# Patient Record
Sex: Female | Born: 1935
Health system: Southern US, Community
[De-identification: ages and names within clinical notes are randomized; demographics above are authoritative.]

## PROBLEM LIST (undated history)

## (undated) DIAGNOSIS — E119 Type 2 diabetes mellitus without complications: Secondary | ICD-10-CM

## (undated) DIAGNOSIS — I10 Essential (primary) hypertension: Secondary | ICD-10-CM

## (undated) DIAGNOSIS — C3490 Malignant neoplasm of unspecified part of unspecified bronchus or lung: Secondary | ICD-10-CM

## (undated) DIAGNOSIS — C679 Malignant neoplasm of bladder, unspecified: Secondary | ICD-10-CM

## (undated) DIAGNOSIS — K219 Gastro-esophageal reflux disease without esophagitis: Secondary | ICD-10-CM

## (undated) DIAGNOSIS — E079 Disorder of thyroid, unspecified: Secondary | ICD-10-CM

## (undated) HISTORY — DX: Essential (primary) hypertension: I10

## (undated) HISTORY — DX: Malignant neoplasm of bladder, unspecified: C67.9

## (undated) HISTORY — DX: Gastro-esophageal reflux disease without esophagitis: K21.9

## (undated) HISTORY — DX: Disorder of thyroid, unspecified: E07.9

## (undated) HISTORY — PX: CYSTOSCOPY: SUR368

## (undated) HISTORY — DX: Malignant neoplasm of unspecified part of unspecified bronchus or lung: C34.90

## (undated) HISTORY — PX: LUNG CANCER SURGERY: SHX702

---

## 2003-10-20 ENCOUNTER — Encounter: Admission: RE | Admit: 2003-10-20 | Discharge: 2003-10-20 | Payer: Self-pay | Admitting: Orthopaedic Surgery

## 2004-09-13 ENCOUNTER — Ambulatory Visit: Payer: Self-pay

## 2006-11-27 ENCOUNTER — Encounter: Admission: RE | Admit: 2006-11-27 | Discharge: 2006-11-27 | Payer: Self-pay | Admitting: Orthopaedic Surgery

## 2007-04-01 ENCOUNTER — Other Ambulatory Visit: Payer: Self-pay

## 2007-04-01 ENCOUNTER — Emergency Department: Payer: Self-pay | Admitting: Unknown Physician Specialty

## 2008-03-28 ENCOUNTER — Ambulatory Visit: Payer: Self-pay | Admitting: Gastroenterology

## 2008-12-12 ENCOUNTER — Ambulatory Visit: Payer: Self-pay | Admitting: Internal Medicine

## 2008-12-12 DIAGNOSIS — I152 Hypertension secondary to endocrine disorders: Secondary | ICD-10-CM | POA: Insufficient documentation

## 2008-12-12 DIAGNOSIS — E785 Hyperlipidemia, unspecified: Secondary | ICD-10-CM

## 2008-12-12 DIAGNOSIS — Z85118 Personal history of other malignant neoplasm of bronchus and lung: Secondary | ICD-10-CM

## 2008-12-12 DIAGNOSIS — I10 Essential (primary) hypertension: Secondary | ICD-10-CM | POA: Insufficient documentation

## 2008-12-12 DIAGNOSIS — R0989 Other specified symptoms and signs involving the circulatory and respiratory systems: Secondary | ICD-10-CM | POA: Insufficient documentation

## 2008-12-12 DIAGNOSIS — R0609 Other forms of dyspnea: Secondary | ICD-10-CM

## 2009-01-11 ENCOUNTER — Ambulatory Visit: Payer: Self-pay | Admitting: Internal Medicine

## 2009-01-11 DIAGNOSIS — K219 Gastro-esophageal reflux disease without esophagitis: Secondary | ICD-10-CM | POA: Insufficient documentation

## 2009-02-22 ENCOUNTER — Ambulatory Visit: Payer: Self-pay | Admitting: Internal Medicine

## 2009-02-22 DIAGNOSIS — R498 Other voice and resonance disorders: Secondary | ICD-10-CM | POA: Insufficient documentation

## 2010-05-14 ENCOUNTER — Emergency Department: Payer: Self-pay | Admitting: Emergency Medicine

## 2010-06-15 ENCOUNTER — Ambulatory Visit: Payer: Self-pay | Admitting: Internal Medicine

## 2010-06-20 ENCOUNTER — Ambulatory Visit: Payer: Self-pay | Admitting: Urology

## 2010-06-24 ENCOUNTER — Ambulatory Visit: Payer: Self-pay | Admitting: Urology

## 2010-07-05 ENCOUNTER — Ambulatory Visit: Payer: Self-pay | Admitting: Internal Medicine

## 2010-07-16 ENCOUNTER — Ambulatory Visit: Payer: Self-pay | Admitting: Internal Medicine

## 2010-07-16 ENCOUNTER — Ambulatory Visit: Payer: Self-pay | Admitting: Oncology

## 2010-08-15 ENCOUNTER — Ambulatory Visit: Payer: Self-pay | Admitting: Internal Medicine

## 2012-02-12 ENCOUNTER — Other Ambulatory Visit: Payer: Self-pay

## 2012-02-12 LAB — BASIC METABOLIC PANEL
Co2: 27 mmol/L (ref 21–32)
Osmolality: 282 (ref 275–301)
Potassium: 4 mmol/L (ref 3.5–5.1)

## 2012-09-15 HISTORY — PX: SKIN CANCER EXCISION: SHX779

## 2012-09-22 ENCOUNTER — Other Ambulatory Visit: Payer: Self-pay

## 2012-09-22 LAB — CBC WITH DIFFERENTIAL/PLATELET
Eosinophil #: 0.2 10*3/uL (ref 0.0–0.7)
HGB: 13 g/dL (ref 12.0–16.0)
Lymphocyte #: 1.7 10*3/uL (ref 1.0–3.6)
MCH: 30.1 pg (ref 26.0–34.0)
MCHC: 33.1 g/dL (ref 32.0–36.0)
Neutrophil #: 4.3 10*3/uL (ref 1.4–6.5)
RBC: 4.33 10*6/uL (ref 3.80–5.20)
RDW: 13 % (ref 11.5–14.5)

## 2012-09-22 LAB — BASIC METABOLIC PANEL
Anion Gap: 6 — ABNORMAL LOW (ref 7–16)
Co2: 25 mmol/L (ref 21–32)
Glucose: 99 mg/dL (ref 65–99)
Osmolality: 280 (ref 275–301)

## 2013-03-22 ENCOUNTER — Other Ambulatory Visit: Payer: Self-pay

## 2013-03-22 LAB — BASIC METABOLIC PANEL
Calcium, Total: 9.2 mg/dL (ref 8.5–10.1)
Chloride: 106 mmol/L (ref 98–107)
Creatinine: 0.91 mg/dL (ref 0.60–1.30)
EGFR (African American): >60
EGFR (Non-African Amer.): 59 — ABNORMAL LOW

## 2014-01-24 ENCOUNTER — Ambulatory Visit (INDEPENDENT_AMBULATORY_CARE_PROVIDER_SITE_OTHER)
Admission: RE | Admit: 2014-01-24 | Discharge: 2014-01-24 | Disposition: A | Discharge status: Home / Self Care | Payer: Medicare Other | Source: Ambulatory Visit | Attending: Internal Medicine | Admitting: Internal Medicine

## 2014-01-24 ENCOUNTER — Encounter: Payer: Self-pay | Admitting: Internal Medicine

## 2014-01-24 ENCOUNTER — Ambulatory Visit (INDEPENDENT_AMBULATORY_CARE_PROVIDER_SITE_OTHER): Payer: Medicare Other | Admitting: Internal Medicine

## 2014-01-24 VITALS — BP 130/70 | HR 74 | Temp 97.9°F | Ht 65.25 in | Wt 197.0 lb

## 2014-01-24 DIAGNOSIS — M545 Low back pain, unspecified: Secondary | ICD-10-CM

## 2014-01-24 DIAGNOSIS — K219 Gastro-esophageal reflux disease without esophagitis: Secondary | ICD-10-CM

## 2014-01-24 DIAGNOSIS — M25552 Pain in left hip: Secondary | ICD-10-CM

## 2014-01-24 DIAGNOSIS — M25559 Pain in unspecified hip: Secondary | ICD-10-CM

## 2014-01-24 DIAGNOSIS — E785 Hyperlipidemia, unspecified: Secondary | ICD-10-CM

## 2014-01-24 DIAGNOSIS — R5383 Other fatigue: Secondary | ICD-10-CM

## 2014-01-24 DIAGNOSIS — I10 Essential (primary) hypertension: Secondary | ICD-10-CM

## 2014-01-24 DIAGNOSIS — R5381 Other malaise: Secondary | ICD-10-CM

## 2014-01-24 LAB — COMPREHENSIVE METABOLIC PANEL
ALBUMIN: 3.7 g/dL (ref 3.5–5.2)
ALT: 17 U/L (ref 0–35)
AST: 25 U/L (ref 0–37)
Alkaline Phosphatase: 85 U/L (ref 39–117)
BILIRUBIN TOTAL: 0.7 mg/dL (ref 0.2–1.2)
BUN: 15 mg/dL (ref 6–23)
CALCIUM: 9.2 mg/dL (ref 8.4–10.5)
CHLORIDE: 106 meq/L (ref 96–112)
CO2: 27 meq/L (ref 19–32)
Creatinine, Ser: 0.9 mg/dL (ref 0.4–1.2)
GFR: 62.71 mL/min (ref >60.00)
GLUCOSE: 92 mg/dL (ref 70–99)
Potassium: 4.3 mEq/L (ref 3.5–5.1)
SODIUM: 139 meq/L (ref 135–145)
TOTAL PROTEIN: 7.6 g/dL (ref 6.0–8.3)

## 2014-01-24 LAB — VITAMIN B12: Vitamin B-12: 243 pg/mL (ref 211–911)

## 2014-01-24 LAB — CBC
HEMATOCRIT: 38.1 % (ref 36.0–46.0)
HEMOGLOBIN: 12.5 g/dL (ref 12.0–15.0)
MCHC: 32.9 g/dL (ref 30.0–36.0)
MCV: 89.3 fl (ref 78.0–100.0)
Platelets: 285 10*3/uL (ref 150.0–400.0)
RBC: 4.27 Mil/uL (ref 3.87–5.11)
RDW: 14.6 % (ref 11.5–15.5)
WBC: 9.8 10*3/uL (ref 4.0–10.5)

## 2014-01-24 LAB — TSH: TSH: 0.41 u[IU]/mL (ref 0.35–4.50)

## 2014-01-24 NOTE — Progress Notes (Signed)
Pre visit review using our clinic review tool, if applicable. No additional management support is needed unless otherwise documented below in the visit note. 

## 2014-01-24 NOTE — Patient Instructions (Addendum)

## 2014-01-24 NOTE — Progress Notes (Signed)
HPI  Pt presents to the clinic today to establish care. She is transferring care from Dr. Hardin Negus in [place]. She does have some concerns today about pain in her left hip. She reports this occurred last Friday night. She was trying to get out of the bed in the middle of the night to use the bathroom. She tried to get up and had a sharp pain in her left hip that radiated down her leg. She felt like she couldn't move her leg secondary to the severe pain.  This episode lasted a few hours and then seemed to resolve on its own. She has not had any pain since that time, but overall just feels achy and sore. She did not take anything OTC for the pain. She has never had pain like this in the past. She denies loss of bowel or bladder.  Additionally, she c/o fatigue. She feels like some of her vitamins may be low. She would like blood work today to see if anything is off.   Flu: 9/14 Tetanus: unsure Pneumovax: unsure Zostovax: unsure Mammogram: 2014 Pap Smear: no longer screening Bone Density: unsure Colon Screening: unsure Eye Doctor: yearly Dentist: biannually  Past Medical History  Diagnosis Date  . GERD (gastroesophageal reflux disease)   . Thyroid disease   . Hypertension     Current Outpatient Prescriptions  Medication Sig Dispense Refill  . Cholecalciferol (VITAMIN D3) 1000 UNITS CAPS Take 1 capsule by mouth daily.      . Glucosamine-Chondroit-Vit C-Mn (GLUCOSAMINE CHONDR 500 COMPLEX PO) Take 2 capsules by mouth daily.      Marland Kitchen levothyroxine (SYNTHROID, LEVOTHROID) 88 MCG tablet Take 88 mcg by mouth daily before breakfast.      . losartan (COZAAR) 50 MG tablet Take 50 mg by mouth daily.      Marland Kitchen omeprazole (PRILOSEC) 20 MG capsule Take 20 mg by mouth daily.       No current facility-administered medications for this visit.    No Known Allergies  Family History  Problem Relation Age of Onset  . Stroke Mother   . Heart disease Father   . Cancer Sister     Pancreatic     History   Social History  . Marital Status: Widowed    Spouse Name: N/A    Number of Children: N/A  . Years of Education: N/A   Occupational History  . Not on file.   Social History Main Topics  . Smoking status: Never Smoker   . Smokeless tobacco: Not on file  . Alcohol Use: No  . Drug Use: No  . Sexual Activity: Not Currently   Other Topics Concern  . Not on file   Social History Narrative  . No narrative on file    ROS:  Constitutional: Pt reports fatigue. Denies fever, malaise, headache or abrupt weight changes.  Respiratory: Denies difficulty breathing, shortness of breath, cough or sputum production.   Cardiovascular: Denies chest pain, chest tightness, palpitations or swelling in the hands or feet.  Musculoskeletal: Pt reports low back pain/left hip pain. Denies decrease in range of motion, difficulty with gait,or joint pain and swelling.    No other specific complaints in a complete review of systems (except as listed in HPI above).  PE:  BP 130/70  Pulse 74  Temp(Src) 97.9 F (36.6 C) (Oral)  Ht 5' 5.25" (1.657 m)  Wt 197 lb (89.359 kg)  BMI 32.55 kg/m2 Wt Readings from Last 3 Encounters:  01/24/14 197 lb (89.359 kg)  02/22/09 196 lb (88.905 kg)  01/11/09 199 lb (90.266 kg)    General: Appears her stated age, well developed, well nourished in NAD. Cardiovascular: Normal rate and rhythm. S1,S2 noted.  No murmur, rubs or gallops noted. No JVD or BLE edema. No carotid bruits noted. Pulmonary/Chest: Normal effort and positive vesicular breath sounds. No respiratory distress. No wheezes, rales or ronchi noted.  Musculoskeletal: Normal flexion and extension of the back and left hip. Strength 5/5 BLE. Unable to reproduce pain with palpation   Assessment and Plan:  Fatigue:  Will check CBC, TSH, Vit B12 and Vit D  Low back pain/left hip pain:  Has improved since that episode Will check xray of lumbar spine and xray of left hip  RTC as needed

## 2014-01-24 NOTE — Assessment & Plan Note (Signed)
Well controlled on prilosec Will continue for now

## 2014-01-24 NOTE — Assessment & Plan Note (Signed)
Well controlled on current therapy Will continue for now 

## 2014-01-25 ENCOUNTER — Telehealth: Payer: Self-pay | Admitting: Internal Medicine

## 2014-01-25 LAB — VITAMIN D 25 HYDROXY (VIT D DEFICIENCY, FRACTURES): Vit D, 25-Hydroxy: 31 ng/mL (ref 30–89)

## 2014-01-25 NOTE — Telephone Encounter (Signed)
Relevant patient education mailed to patient.  

## 2014-06-28 ENCOUNTER — Telehealth: Payer: Self-pay

## 2014-06-28 NOTE — Telephone Encounter (Signed)
Pt request to pick up copy of recent xrays; pt having problems with back, knee and arm. Pt request to pick up ASAP. Spoke with Aniceto Boss and will send note to [facility].

## 2014-06-28 NOTE — Telephone Encounter (Signed)
Disc ready for pickup, pt notified

## 2015-09-11 ENCOUNTER — Encounter: Payer: Self-pay | Admitting: Internal Medicine

## 2015-10-02 ENCOUNTER — Encounter: Payer: Self-pay | Admitting: Internal Medicine

## 2015-10-02 ENCOUNTER — Ambulatory Visit (INDEPENDENT_AMBULATORY_CARE_PROVIDER_SITE_OTHER): Payer: Medicare Other | Admitting: Internal Medicine

## 2015-10-02 VITALS — BP 132/78 | HR 76 | Temp 97.9°F | Ht 64.5 in | Wt 192.5 lb

## 2015-10-02 DIAGNOSIS — E039 Hypothyroidism, unspecified: Secondary | ICD-10-CM | POA: Insufficient documentation

## 2015-10-02 DIAGNOSIS — I10 Essential (primary) hypertension: Secondary | ICD-10-CM

## 2015-10-02 DIAGNOSIS — Z13820 Encounter for screening for osteoporosis: Secondary | ICD-10-CM | POA: Diagnosis not present

## 2015-10-02 DIAGNOSIS — E559 Vitamin D deficiency, unspecified: Secondary | ICD-10-CM

## 2015-10-02 DIAGNOSIS — E785 Hyperlipidemia, unspecified: Secondary | ICD-10-CM

## 2015-10-02 DIAGNOSIS — K219 Gastro-esophageal reflux disease without esophagitis: Secondary | ICD-10-CM

## 2015-10-02 DIAGNOSIS — Z0000 Encounter for general adult medical examination without abnormal findings: Secondary | ICD-10-CM

## 2015-10-02 DIAGNOSIS — G3184 Mild cognitive impairment, so stated: Secondary | ICD-10-CM

## 2015-10-02 LAB — VITAMIN D 25 HYDROXY (VIT D DEFICIENCY, FRACTURES): VITD: 19.84 ng/mL — AB (ref 30.00–100.00)

## 2015-10-02 LAB — COMPREHENSIVE METABOLIC PANEL
ALBUMIN: 3.5 g/dL (ref 3.5–5.2)
ALT: 20 U/L (ref 0–35)
AST: 25 U/L (ref 0–37)
Alkaline Phosphatase: 93 U/L (ref 39–117)
BILIRUBIN TOTAL: 0.6 mg/dL (ref 0.2–1.2)
BUN: 17 mg/dL (ref 6–23)
CALCIUM: 9.3 mg/dL (ref 8.4–10.5)
CHLORIDE: 106 meq/L (ref 96–112)
CO2: 30 meq/L (ref 19–32)
CREATININE: 0.85 mg/dL (ref 0.40–1.20)
GFR: 67.57 mL/min (ref >60.00)
Glucose, Bld: 105 mg/dL — ABNORMAL HIGH (ref 70–99)
Potassium: 4.4 mEq/L (ref 3.5–5.1)
SODIUM: 140 meq/L (ref 135–145)
Total Protein: 6.9 g/dL (ref 6.0–8.3)

## 2015-10-02 LAB — LIPID PANEL
CHOL/HDL RATIO: 5
Cholesterol: 190 mg/dL (ref 0–200)
HDL: 41 mg/dL (ref >39.00)
LDL Cholesterol: 120 mg/dL — ABNORMAL HIGH (ref 0–99)
NONHDL: 148.82
Triglycerides: 142 mg/dL (ref 0.0–149.0)
VLDL: 28.4 mg/dL (ref 0.0–40.0)

## 2015-10-02 LAB — T4, FREE: FREE T4: 0.94 ng/dL (ref 0.60–1.60)

## 2015-10-02 LAB — TSH: TSH: 2.76 u[IU]/mL (ref 0.35–4.50)

## 2015-10-02 LAB — CBC
HCT: 38.4 % (ref 36.0–46.0)
Hemoglobin: 12.4 g/dL (ref 12.0–15.0)
MCHC: 32.2 g/dL (ref 30.0–36.0)
MCV: 87.3 fl (ref 78.0–100.0)
PLATELETS: 286 10*3/uL (ref 150.0–400.0)
RBC: 4.4 Mil/uL (ref 3.87–5.11)
RDW: 14.9 % (ref 11.5–15.5)
WBC: 7.2 10*3/uL (ref 4.0–10.5)

## 2015-10-02 MED ORDER — MEMANTINE HCL 5 MG PO TABS: 5.0000 mg | ORAL_TABLET | Freq: Two times a day (BID) | ORAL | Status: DC

## 2015-10-02 NOTE — Assessment & Plan Note (Signed)
Will check CMET and Lipid profile today Encouraged her to consume a low fat diet

## 2015-10-02 NOTE — Assessment & Plan Note (Signed)
Will check TSH and T4 today Will adjust/refill Synthroid based on labs

## 2015-10-02 NOTE — Addendum Note (Signed)
Addended by: Jearld Fenton on: 10/02/2015 09:18 AM   Modules accepted: Miquel Dunn

## 2015-10-02 NOTE — Progress Notes (Signed)
Pre visit review using our clinic review tool, if applicable. No additional management support is needed unless otherwise documented below in the visit note. 

## 2015-10-02 NOTE — Assessment & Plan Note (Signed)
Will trial Namenda eRx sent to pharmacy

## 2015-10-02 NOTE — Patient Instructions (Signed)
Fall Prevention in the Home  Falls can cause injuries and can affect people from all age groups. There are many simple things that you can do to make your home safe and to help prevent falls. WHAT CAN I DO ON THE OUTSIDE OF MY HOME?  Regularly repair the edges of walkways and driveways and fix any cracks.  Remove high doorway thresholds.  Trim any shrubbery on the main path into your home.  Use bright outdoor lighting.  Clear walkways of debris and clutter, including tools and rocks.  Regularly check that handrails are securely fastened and in good repair. Both sides of any steps should have handrails.  Install guardrails along the edges of any raised decks or porches.  Have leaves, snow, and ice cleared regularly.  Use sand or salt on walkways during winter months.  In the garage, clean up any spills right away, including grease or oil spills. WHAT CAN I DO IN THE BATHROOM?  Use night lights.  Install grab bars by the toilet and in the tub and shower. Do not use towel bars as grab bars.  Use non-skid mats or decals on the floor of the tub or shower.  If you need to sit down while you are in the shower, use a plastic, non-slip stool..  Keep the floor dry. Immediately clean up any water that spills on the floor.  Remove soap buildup in the tub or shower on a regular basis.  Attach bath mats securely with double-sided non-slip rug tape.  Remove throw rugs and other tripping hazards from the floor. WHAT CAN I DO IN THE BEDROOM?  Use night lights.  Make sure that a bedside light is easy to reach.  Do not use oversized bedding that drapes onto the floor.  Have a firm chair that has side arms to use for getting dressed.  Remove throw rugs and other tripping hazards from the floor. WHAT CAN I DO IN THE KITCHEN?   Clean up any spills right away.  Avoid walking on wet floors.  Place frequently used items in easy-to-reach places.  If you need to reach for something  above you, use a sturdy step stool that has a grab bar.  Keep electrical cables out of the way.  Do not use floor polish or wax that makes floors slippery. If you have to use wax, make sure that it is non-skid floor wax.  Remove throw rugs and other tripping hazards from the floor. WHAT CAN I DO IN THE STAIRWAYS?  Do not leave any items on the stairs.  Make sure that there are handrails on both sides of the stairs. Fix handrails that are broken or loose. Make sure that handrails are as long as the stairways.  Check any carpeting to make sure that it is firmly attached to the stairs. Fix any carpet that is loose or worn.  Avoid having throw rugs at the top or bottom of stairways, or secure the rugs with carpet tape to prevent them from moving.  Make sure that you have a light switch at the top of the stairs and the bottom of the stairs. If you do not have them, have them installed. WHAT ARE SOME OTHER FALL PREVENTION TIPS?  Wear closed-toe shoes that fit well and support your feet. Wear shoes that have rubber soles or low heels.  When you use a stepladder, make sure that it is completely opened and that the sides are firmly locked. Have someone hold the ladder while you   are using it. Do not climb a closed stepladder.  Add color or contrast paint or tape to grab bars and handrails in your home. Place contrasting color strips on the first and last steps.  Use mobility aids as needed, such as canes, walkers, scooters, and crutches.  Turn on lights if it is dark. Replace any light bulbs that burn out.  Set up furniture so that there are clear paths. Keep the furniture in the same spot.  Fix any uneven floor surfaces.  Choose a carpet design that does not hide the edge of steps of a stairway.  Be aware of any and all pets.  Review your medicines with your healthcare provider. Some medicines can cause dizziness or changes in blood pressure, which increase your risk of falling. Talk  with your health care provider about other ways that you can decrease your risk of falls. This may include working with a physical therapist or trainer to improve your strength, balance, and endurance.   This information is not intended to replace advice given to you by your health care provider. Make sure you discuss any questions you have with your health care provider.   Document Released: 08/22/2002 Document Revised: 01/16/2015 Document Reviewed: 10/06/2014 Elsevier Interactive Patient Education 2016 [employer] Inc[employer]  

## 2015-10-02 NOTE — Assessment & Plan Note (Signed)
Well controlled on Losartan CBC and CMET today

## 2015-10-02 NOTE — Progress Notes (Signed)
HPI:  Pt presents to the clinic today for her annual exam. She is also due for follow up oc chronic conditions.  GERD: She takes her Prilosec only as needed. She only takes it a few times per month.  Hypothyroidism: She is taking her Synthroid as prescribed. She denies weight gain, cold intolerance, excessively dry skin, constipation or depression.  HTN: Her BP is well controlled on Losartan. ECG from 06/2010 reviewed. Her BP today is 132/78.  Past Medical History  Diagnosis Date  . GERD (gastroesophageal reflux disease)   . Thyroid disease   . Hypertension     Current Outpatient Prescriptions  Medication Sig Dispense Refill  . Cholecalciferol (VITAMIN D3) 1000 UNITS CAPS Take 1 capsule by mouth daily.    . Glucosamine-Chondroit-Vit C-Mn (GLUCOSAMINE CHONDR 500 COMPLEX PO) Take 2 capsules by mouth daily.    Marland Kitchen levothyroxine (SYNTHROID, LEVOTHROID) 88 MCG tablet Take 88 mcg by mouth daily before breakfast.    . losartan (COZAAR) 50 MG tablet Take 50 mg by mouth daily.    Marland Kitchen omeprazole (PRILOSEC) 20 MG capsule Take 20 mg by mouth daily.     No current facility-administered medications for this visit.    No Known Allergies  Family History  Problem Relation Age of Onset  . Stroke Mother   . Heart disease Father   . Cancer Sister     Pancreatic    Social History   Social History  . Marital Status: Widowed    Spouse Name: N/A  . Number of Children: N/A  . Years of Education: N/A   Occupational History  . Not on file.   Social History Main Topics  . Smoking status: Never Smoker   . Smokeless tobacco: Not on file  . Alcohol Use: No  . Drug Use: No  . Sexual Activity: Not Currently   Other Topics Concern  . Not on file   Social History Narrative  . No narrative on file    Hospitiliaztions: None  Health Maintenance:    Flu: 05/2015 at [facility]  Tetanus: unsure  Pneumovax: unsure  Prevnar: unsure  Zostavax: never  Mammogram: 2014  Pap Smear: no longer  screening  Bone Density: unsure  Colon Screening: unsure  Eye Doctor: yearly  Dental Exam: biannually   Providers:   PCP: Webb Silversmith, NP-C, Dr. Hardin Negus  Urologist: Dr. Brendia Sacks   I have personally reviewed and have noted:  1. The patient's medical and social history 2. Their use of alcohol, tobacco or illicit drugs 3. Their current medications and supplements 4. The patient's functional ability including ADL's, fall risks, home safety  risks and hearing or visual impairment. 5. Diet and physical activities 6. Evidence for depression or mood disorder  Subjective:   Review of Systems:   Constitutional: Denies fever, malaise, fatigue, headache or abrupt weight changes.  HEENT: Pt reports decreased hearing. Denies eye pain, eye redness, ear pain, ringing in the ears, wax buildup, runny nose, nasal congestion, bloody nose, or sore throat. Respiratory: Denies difficulty breathing, shortness of breath, cough or sputum production.   Cardiovascular: Denies chest pain, chest tightness, palpitations or swelling in the hands or feet.  Gastrointestinal: Denies abdominal pain, bloating, constipation, diarrhea or blood in the stool.  Neurological: Pt reports difficulty with memory. Denies dizziness, difficulty with memory, difficulty with speech or problems with balance and coordination.  Psych: Denies anxiety, depression, SI/HI.  No other specific complaints in a complete review of systems (except as listed in HPI above).  Objective:  PE:   BP 132/78 mmHg  Pulse 76  Temp(Src) 97.9 F (36.6 C) (Oral)  Ht 5' 4.5" (1.638 m)  Wt 192 lb 8 oz (87.317 kg)  BMI 32.54 kg/m2  SpO2 97%  Wt Readings from Last 3 Encounters:  01/24/14 197 lb (89.359 kg)  02/22/09 196 lb (88.905 kg)  01/11/09 199 lb (90.266 kg)    General: Appears her stated age,  in NAD. HEENT:  Ears: Tm's gray and intact, normal light reflex; Neck: Thyromegaly noted.  Cardiovascular: Normal rate and rhythm. S1,S2 noted.   No murmur, rubs or gallops noted. . Pulmonary/Chest: Normal effort and positive vesicular breath sounds. No respiratory distress. No wheezes, rales or ronchi noted.  Abdomen: Soft and nontender. Normal bowel sounds. No distention or masses noted. Neurological: Alert and oriented.  Psychiatric: Mood and affect normal. Behavior is normal. Judgment and thought content normal.     BMET    Component Value Date/Time   NA 139 01/24/2014 1013   NA 140 03/22/2013 0800   K 4.3 01/24/2014 1013   K 4.1 03/22/2013 0800   CL 106 01/24/2014 1013   CL 106 03/22/2013 0800   CO2 27 01/24/2014 1013   CO2 27 03/22/2013 0800   GLUCOSE 92 01/24/2014 1013   GLUCOSE 104* 03/22/2013 0800   BUN 15 01/24/2014 1013   BUN 15 03/22/2013 0800   CREATININE 0.9 01/24/2014 1013   CREATININE 0.91 03/22/2013 0800   CALCIUM 9.2 01/24/2014 1013   CALCIUM 9.2 03/22/2013 0800   GFRNONAA 59* 03/22/2013 0800   GFRAA >60 03/22/2013 0800    Lipid Panel  No results found for: CHOL, TRIG, HDL, CHOLHDL, VLDL, LDLCALC  CBC    Component Value Date/Time   WBC 9.8 01/24/2014 1013   WBC 7.0 09/22/2012 0934   RBC 4.27 01/24/2014 1013   RBC 4.33 09/22/2012 0934   HGB 12.5 01/24/2014 1013   HGB 13.0 09/22/2012 0934   HCT 38.1 01/24/2014 1013   HCT 39.4 09/22/2012 0934   PLT 285.0 01/24/2014 1013   PLT 253 09/22/2012 0934   MCV 89.3 01/24/2014 1013   MCV 91 09/22/2012 0934   MCH 30.1 09/22/2012 0934   MCHC 32.9 01/24/2014 1013   MCHC 33.1 09/22/2012 0934   RDW 14.6 01/24/2014 1013   RDW 13.0 09/22/2012 0934   LYMPHSABS 1.7 09/22/2012 0934   MONOABS 0.7 09/22/2012 0934   EOSABS 0.2 09/22/2012 0934   BASOSABS 0.1 09/22/2012 0934    Hgb A1C No results found for: HGBA1C    Assessment and Plan:   Medicare Annual Wellness Visit:  Diet: She does not adhere to any specific diet regimen Physical activity: Sedentary, she does work as a Scientist, water quality at [employer] Depression/mood screen: Negative Hearing: Some  difficulty, has seen an [place] acuity: Grossly normal, performs annual eye exam  ADLs: Capable Fall risk: None Home safety: Good Cognitive evaluation: Intact to orientation, naming. She has notable trouble with recall. She does repeat things she just mentioned. EOL planning: Adv directives, full code/ I agree  Preventative Medicine: Flu shot UTD. Will contact Dr. Phillips/[facility] to see what immunizations she has had. She no longer wants to perform mammogram, pap smear, bone density and colon cancer screening. She will continue to see an eye doctor and dentist annually. Encouraged her to consume a healthy diet, and start walking to stay active.   Next appointment: 1 month, follow up memory

## 2015-10-02 NOTE — Assessment & Plan Note (Signed)
She does not want to take Prilosec daily, will continue prn CMET today

## 2015-10-05 MED ORDER — VITAMIN D (ERGOCALCIFEROL) 1.25 MG (50000 UNIT) PO CAPS: 50000.0000 [IU] | ORAL_CAPSULE | ORAL | Status: DC

## 2015-10-05 NOTE — Addendum Note (Signed)
Addended by: Lurlean Nanny on: 10/05/2015 09:30 AM   Modules accepted: Orders

## 2015-10-29 ENCOUNTER — Telehealth: Payer: Self-pay | Admitting: Internal Medicine

## 2015-10-29 NOTE — Telephone Encounter (Signed)
Spoke with pt to inform that we are unable to get Dr. Hardin Negus office by phone or fax.  She will go by there to pick up her records personally

## 2015-11-05 ENCOUNTER — Ambulatory Visit: Payer: Medicare Other | Admitting: Internal Medicine

## 2015-11-08 ENCOUNTER — Other Ambulatory Visit: Payer: Self-pay

## 2015-11-08 MED ORDER — LOSARTAN POTASSIUM 50 MG PO TABS: 50.0000 mg | ORAL_TABLET | Freq: Every day | ORAL | Status: DC

## 2015-11-15 ENCOUNTER — Ambulatory Visit (INDEPENDENT_AMBULATORY_CARE_PROVIDER_SITE_OTHER): Payer: Medicare Other | Admitting: Internal Medicine

## 2015-11-15 ENCOUNTER — Encounter: Payer: Self-pay | Admitting: Internal Medicine

## 2015-11-15 VITALS — BP 134/70 | HR 72 | Temp 97.7°F | Wt 194.0 lb

## 2015-11-15 DIAGNOSIS — G3184 Mild cognitive impairment, so stated: Secondary | ICD-10-CM

## 2015-11-15 DIAGNOSIS — E559 Vitamin D deficiency, unspecified: Secondary | ICD-10-CM

## 2015-11-15 LAB — VITAMIN D 25 HYDROXY (VIT D DEFICIENCY, FRACTURES): VITD: 27.08 ng/mL — ABNORMAL LOW (ref 30.00–100.00)

## 2015-11-15 MED ORDER — MEMANTINE HCL 10 MG PO TABS: 10.0000 mg | ORAL_TABLET | Freq: Two times a day (BID) | ORAL | Status: DC

## 2015-11-15 NOTE — Assessment & Plan Note (Signed)
No improvement Will increase Namenda to 10 mg BID  If continues to see no improvement, will refer to neurology for further evaluation

## 2015-11-15 NOTE — Patient Instructions (Signed)
Mild Neurocognitive Disorder Mild neurocognitive disorder (formerly known as mild cognitive impairment) is a mental disorder. It is a slight abnormal decrease in mental function. The areas of mental function affected may include memory, thought, communication, behavior, and completion of tasks. The decrease is noticeable and measurable but for the most part does not interfere with your daily activities. Mild neurocognitive disorder typically occurs in people older than 60 years but can occur earlier. It is not as serious as major neurocognitive disorder (formerly known as dementia) but may lead to a more serious neurocognitive disorder. However, in some cases the condition does not get worse. A few people with this disorder even improve. CAUSES  There are a number of different causes of mild neurocognitive disorder:   Brain disorders associated with abnormal protein deposits, such as Alzheimer's disease, Pick's disease, and Lewy body disease.  Brain disorders associated with abnormal movement, such as Parkinson's disease and Huntington's disease.  Diseases affecting blood vessels in the brain and resulting in mini-strokes.  Certain infections, such as human immunodeficiency virus (HIV) infection.  Traumatic brain injury.  Other medical conditions such as brain tumors, underactive thyroid (hypothyroidism), and vitamin B12 deficiency.  Use of certain prescription medicine and "recreational" drugs. SYMPTOMS  Symptoms of mild neurocognitive disorder include:  Difficulty remembering. You may forget details of recent events, names, or phone numbers. You may forget important social events and appointments or repeatedly forget where you put your car keys.  Difficulty thinking and solving problems. You may have trouble with complex tasks such as paying bills or driving in unfamiliar locations.  Difficulty communicating. You may have trouble finding the right word, naming an object, forming a  sentence that makes sense, or understanding what you read or hear.  Changes in your behavior or personality. You may lose interest in the things that you used to enjoy or withdraw from social situations. You may get angry more easily than usual. You may act before thinking. You may do things in public that you would not usually do. You may hear or see things that are not real (hallucinations). You may believe falsely that others are trying to hurt you (paranoia). DIAGNOSIS Mild neurocognitive disorder is diagnosed through an assessment by your health care provider. Your health care provider will ask you and your family, friends, or coworkers questions about your symptoms. He or she will ask how often the symptoms occur, how long they have been occurring, whether they are getting worse, and the effect they are having on your life. Your health care provider may refer you to a neurologist or mental health specialist for a detailed evaluation of your mental functions (neuropsychological testing).  To identify the cause of your mild neurocognitive disorder, your health care provider may:  Obtain a detailed medical history.  Ask about alcohol and drug use, including prescription medicine.  Perform a physical exam.  Order blood tests and brain imaging exams. TREATMENT  Mild neurocognitive disorder caused by infections, use of certain medicines or "recreational" drugs, and certain medical conditions may improve with treatment of the condition that is causing the disorder. Mild neurocognitive disorder resulting from other causes generally does not improve and may worsen. In these cases, the goal of treatment is to slow progression of the disorder and help you cope with the loss of mental function. Treatments in these cases include:   Medicine. Medicine helps mainly with memory loss and behavioral symptoms.   Talk therapy. Talk therapy provides education, emotional support, memory aids, and other  ways of  making up for decreases in mental function.   Lifestyle changes. These include regular exercise, a healthy diet (including essential omega-3 fatty acids), intellectual stimulation, and increased social interaction.   This information is not intended to replace advice given to you by your health care provider. Make sure you discuss any questions you have with your health care provider.   Document Released: 05/04/2013 Document Revised: 09/22/2014 Document Reviewed: 05/04/2013 Elsevier Interactive Patient Education [employer].

## 2015-11-15 NOTE — Addendum Note (Signed)
Addended by: Jearld Fenton on: 11/15/2015 12:02 PM   Modules accepted: Miquel Dunn

## 2015-11-15 NOTE — Progress Notes (Signed)
Pre visit review using our clinic review tool, if applicable. No additional management support is needed unless otherwise documented below in the visit note. 

## 2015-11-15 NOTE — Progress Notes (Signed)
Subjective:    Patient ID: Michelle Vang, female    DOB: 09/22/1928, 82 y.o.   MRN: [id number]  HPI  Pt presents to the clinic today for follow up.  Mild cognitive impairment: She is having issues with her short term memory. Her family also reports she repeats herself frequently. She was started on Namenda 5 mg BID at her last visit. Her daughter reports they have not noticed any improvement.  Vit D deficiency: She was started on Ergocalciferol at her last visit for a Vit D level of 19.84. She took all 12 pills in the last 5 weeks.  Review of Systems    Past Medical History  Diagnosis Date  . GERD (gastroesophageal reflux disease)   . Thyroid disease   . Hypertension     Current Outpatient Prescriptions  Medication Sig Dispense Refill  . Glucosamine-Chondroit-Vit C-Mn (GLUCOSAMINE CHONDR 500 COMPLEX PO) Take 2 capsules by mouth daily.    Marland Kitchen levothyroxine (SYNTHROID, LEVOTHROID) 88 MCG tablet Take 88 mcg by mouth daily before breakfast.    . losartan (COZAAR) 50 MG tablet Take 1 tablet (50 mg total) by mouth daily. 90 tablet 1  . omeprazole (PRILOSEC) 20 MG capsule Take 20 mg by mouth daily.    . Vitamin D, Ergocalciferol, (DRISDOL) 50000 units CAPS capsule Take 1 capsule (50,000 Units total) by mouth every 7 (seven) days. 12 capsule 0  . Cholecalciferol (VITAMIN D3) 1000 UNITS CAPS Take 1 capsule by mouth daily. Reported on 11/15/2015    . memantine (NAMENDA) 10 MG tablet Take 1 tablet (10 mg total) by mouth 2 (two) times daily. 60 tablet 2   No current facility-administered medications for this visit.    No Known Allergies  Family History  Problem Relation Age of Onset  . Stroke Mother   . Heart disease Father   . Cancer Sister     Pancreatic    Social History   Social History  . Marital Status: Widowed    Spouse Name: N/A  . Number of Children: N/A  . Years of Education: N/A   Occupational History  . Not on file.   Social History Main Topics  . Smoking  status: Never Smoker   . Smokeless tobacco: Not on file  . Alcohol Use: No  . Drug Use: No  . Sexual Activity: Not Currently   Other Topics Concern  . Not on file   Social History Narrative     Constitutional: Denies fever, malaise, fatigue, headache or abrupt weight changes.  Neurological: Pt reports difficulty with memory. Denies dizziness, difficulty with speech or problems with balance and coordination.    No other specific complaints in a complete review of systems (except as listed in HPI above).     Objective:   Physical Exam  BP 134/70 mmHg  Pulse 72  Temp(Src) 97.7 F (36.5 C) (Oral)  Wt 194 lb (87.998 kg)  SpO2 98% Wt Readings from Last 3 Encounters:  11/15/15 194 lb (87.998 kg)  10/02/15 192 lb 8 oz (87.317 kg)  01/24/14 197 lb (89.359 kg)    General: Appears her stated age, in NAD. Neurological: Alert and oriented. Noticeable difficulty with short term memory.   BMET    Component Value Date/Time   NA 140 10/02/2015 0905   NA 140 03/22/2013 0800   K 4.4 10/02/2015 0905   K 4.1 03/22/2013 0800   CL 106 10/02/2015 0905   CL 106 03/22/2013 0800   CO2 30 10/02/2015 0905  CO2 27 03/22/2013 0800   GLUCOSE 105* 10/02/2015 0905   GLUCOSE 104* 03/22/2013 0800   BUN 17 10/02/2015 0905   BUN 15 03/22/2013 0800   CREATININE 0.85 10/02/2015 0905   CREATININE 0.91 03/22/2013 0800   CALCIUM 9.3 10/02/2015 0905   CALCIUM 9.2 03/22/2013 0800   GFRNONAA 59* 03/22/2013 0800   GFRAA >60 03/22/2013 0800    Lipid Panel     Component Value Date/Time   CHOL 190 10/02/2015 0905   TRIG 142.0 10/02/2015 0905   HDL 41.00 10/02/2015 0905   CHOLHDL 5 10/02/2015 0905   VLDL 28.4 10/02/2015 0905   LDLCALC 120* 10/02/2015 0905    CBC    Component Value Date/Time   WBC 7.2 10/02/2015 0905   WBC 7.0 09/22/2012 0934   RBC 4.40 10/02/2015 0905   RBC 4.33 09/22/2012 0934   HGB 12.4 10/02/2015 0905   HGB 13.0 09/22/2012 0934   HCT 38.4 10/02/2015 0905   HCT  39.4 09/22/2012 0934   PLT 286.0 10/02/2015 0905   PLT 253 09/22/2012 0934   MCV 87.3 10/02/2015 0905   MCV 91 09/22/2012 0934   MCH 30.1 09/22/2012 0934   MCHC 32.2 10/02/2015 0905   MCHC 33.1 09/22/2012 0934   RDW 14.9 10/02/2015 0905   RDW 13.0 09/22/2012 0934   LYMPHSABS 1.7 09/22/2012 0934   MONOABS 0.7 09/22/2012 0934   EOSABS 0.2 09/22/2012 0934   BASOSABS 0.1 09/22/2012 0934    Hgb A1C No results found for: HGBA1C     Assessment & Plan:   Vit D deficiency:  Did not take medication as prescribed- likely secondary to memory impairment Will recheck Vit D level today  RTC in 6 months to follow up chronic conditions

## 2015-12-07 ENCOUNTER — Encounter: Payer: Self-pay | Admitting: Family Medicine

## 2015-12-07 ENCOUNTER — Ambulatory Visit (INDEPENDENT_AMBULATORY_CARE_PROVIDER_SITE_OTHER): Payer: Medicare Other | Admitting: Family Medicine

## 2015-12-07 VITALS — BP 142/78 | HR 82 | Temp 98.1°F | Ht 64.5 in | Wt 197.4 lb

## 2015-12-07 DIAGNOSIS — J069 Acute upper respiratory infection, unspecified: Secondary | ICD-10-CM | POA: Diagnosis not present

## 2015-12-07 DIAGNOSIS — B9789 Other viral agents as the cause of diseases classified elsewhere: Principal | ICD-10-CM

## 2015-12-07 NOTE — Progress Notes (Signed)
Pre visit review using our clinic review tool, if applicable. No additional management support is needed unless otherwise documented below in the visit note. 

## 2015-12-07 NOTE — Assessment & Plan Note (Signed)
New acute problem. Exam unremarkable. Appears to be viral in origin as are most respiratory complaints/illnesses. Advised supportive care and over-the-counter Zyrtec and/or Flonase. No indication for antibiotics. Patient to follow-up if she fails to improve or worsens.

## 2015-12-07 NOTE — Progress Notes (Signed)
   Subjective:  Patient ID: Michelle Vang, female    DOB: October 29, 1928  Age: 82 y.o. MRN: [id number]  CC: Cough, sore throat, congestion  HPI:  82 year old female with a history of lung cancer status post lung resection presents to clinic today with the above complaints.  Patient states that yesterday she developed cold symptoms. She has been experiencing cough, congestion, and sore throat. Symptoms are mild to moderate in severity. She denies any associated fever. No shortness of breath. She been taking Mucinex with no improvement. Given her history, she was concerned and decided to come in for evaluation. No known exacerbating or relieving factors.  Social Hx   Social History   Social History  . Marital Status: Widowed    Spouse Name: N/A  . Number of Children: N/A  . Years of Education: N/A   Social History Main Topics  . Smoking status: Never Smoker   . Smokeless tobacco: None  . Alcohol Use: No  . Drug Use: No  . Sexual Activity: Not Currently   Other Topics Concern  . None   Social History Narrative   Review of Systems  Constitutional: Negative for fever.  HENT: Positive for congestion and sore throat.   Respiratory: Positive for cough. Negative for shortness of breath.    Objective:  BP 142/78 mmHg  Pulse 82  Temp(Src) 98.1 F (36.7 C) (Oral)  Ht 5' 4.5" (1.638 m)  Wt 197 lb 6 oz (89.529 kg)  BMI 33.37 kg/m2  SpO2 95%  BP/Weight 12/07/2015 11/15/2015 0/37/0488  Systolic BP 891 694 503  Diastolic BP 78 70 78  Wt. (Lbs) 197.38 194 192.5  BMI 33.37 32.8 32.54   Physical Exam  Constitutional: She appears well-developed. No distress.  HENT:  Head: Normocephalic and atraumatic.  Mouth/Throat: Oropharynx is clear and moist.  Normal TM's bilaterally.  Neck: Neck supple.  Cardiovascular: Normal rate and regular rhythm.   Pulmonary/Chest: Effort normal and breath sounds normal. No respiratory distress. She has no wheezes. She has no rales.  Lymphadenopathy:   She has no cervical adenopathy.  Vitals reviewed.  Lab Results  Component Value Date   WBC 7.2 10/02/2015   HGB 12.4 10/02/2015   HCT 38.4 10/02/2015   PLT 286.0 10/02/2015   GLUCOSE 105* 10/02/2015   CHOL 190 10/02/2015   TRIG 142.0 10/02/2015   HDL 41.00 10/02/2015   LDLCALC 120* 10/02/2015   ALT 20 10/02/2015   AST 25 10/02/2015   NA 140 10/02/2015   K 4.4 10/02/2015   CL 106 10/02/2015   CREATININE 0.85 10/02/2015   BUN 17 10/02/2015   CO2 30 10/02/2015   TSH 2.76 10/02/2015    Assessment & Plan:   Problem List Items Addressed This Visit    Viral URI with cough - Primary    New acute problem. Exam unremarkable. Appears to be viral in origin as are most respiratory complaints/illnesses. Advised supportive care and over-the-counter Zyrtec and/or Flonase. No indication for antibiotics. Patient to follow-up if she fails to improve or worsens.         Follow-up: PRN  Fisher

## 2015-12-07 NOTE — Patient Instructions (Signed)
Use over the counter Flonase and Zyrtec for your symptoms.  Let us know if you worsen.  Take care  Dr. Lacinda Axon    Upper Respiratory Infection, Adult Most upper respiratory infections (URIs) are a viral infection of the air passages leading to the lungs. A URI affects the nose, throat, and upper air passages. The most common type of URI is nasopharyngitis and is typically referred to as "the common cold." URIs run their course and usually go away on their own. Most of the time, a URI does not require medical attention, but sometimes a bacterial infection in the upper airways can follow a viral infection. This is called a secondary infection. Sinus and middle ear infections are common types of secondary upper respiratory infections. Bacterial pneumonia can also complicate a URI. A URI can worsen asthma and chronic obstructive pulmonary disease (COPD). Sometimes, these complications can require emergency medical care and may be life threatening.  CAUSES Almost all URIs are caused by viruses. A virus is a type of germ and can spread from one person to another.  RISKS FACTORS You may be at risk for a URI if:   You smoke.   You have chronic heart or lung disease.  You have a weakened defense (immune) system.   You are very young or very old.   You have nasal allergies or asthma.  You work in crowded or poorly ventilated areas.  You work in health care facilities or schools. SIGNS AND SYMPTOMS  Symptoms typically develop 2-3 days after you come in contact with a cold virus. Most viral URIs last 7-10 days. However, viral URIs from the influenza virus (flu virus) can last 14-18 days and are typically more severe. Symptoms may include:   Runny or stuffy (congested) nose.   Sneezing.   Cough.   Sore throat.   Headache.   Fatigue.   Fever.   Loss of appetite.   Pain in your forehead, behind your eyes, and over your cheekbones (sinus pain).  Muscle aches.  DIAGNOSIS    Your health care provider may diagnose a URI by:  Physical exam.  Tests to check that your symptoms are not due to another condition such as:  Strep throat.  Sinusitis.  Pneumonia.  Asthma. TREATMENT  A URI goes away on its own with time. It cannot be cured with medicines, but medicines may be prescribed or recommended to relieve symptoms. Medicines may help:  Reduce your fever.  Reduce your cough.  Relieve nasal congestion. HOME CARE INSTRUCTIONS   Take medicines only as directed by your health care provider.   Gargle warm saltwater or take cough drops to comfort your throat as directed by your health care provider.  Use a warm mist humidifier or inhale steam from a shower to increase air moisture. This may make it easier to breathe.  Drink enough fluid to keep your urine clear or pale yellow.   Eat soups and other clear broths and maintain good nutrition.   Rest as needed.   Return to work when your temperature has returned to normal or as your health care provider advises. You may need to stay home longer to avoid infecting others. You can also use a face mask and careful hand washing to prevent spread of the virus.  Increase the usage of your inhaler if you have asthma.   Do not use any tobacco products, including cigarettes, chewing tobacco, or electronic cigarettes. If you need help quitting, ask your health care provider. PREVENTION  The best way to protect yourself from getting a cold is to practice good hygiene.   Avoid oral or hand contact with people with cold symptoms.   Wash your hands often if contact occurs.  There is no clear evidence that vitamin C, vitamin E, echinacea, or exercise reduces the chance of developing a cold. However, it is always recommended to get plenty of rest, exercise, and practice good nutrition.  SEEK MEDICAL CARE IF:   You are getting worse rather than better.   Your symptoms are not controlled by medicine.   You  have chills.  You have worsening shortness of breath.  You have brown or red mucus.  You have yellow or brown nasal discharge.  You have pain in your face, especially when you bend forward.  You have a fever.  You have swollen neck glands.  You have pain while swallowing.  You have white areas in the back of your throat. SEEK IMMEDIATE MEDICAL CARE IF:   You have severe or persistent:  Headache.  Ear pain.  Sinus pain.  Chest pain.  You have chronic lung disease and any of the following:  Wheezing.  Prolonged cough.  Coughing up blood.  A change in your usual mucus.  You have a stiff neck.  You have changes in your:  Vision.  Hearing.  Thinking.  Mood. MAKE SURE YOU:   Understand these instructions.  Will watch your condition.  Will get help right away if you are not doing well or get worse.   This information is not intended to replace advice given to you by your health care provider. Make sure you discuss any questions you have with your health care provider.   Document Released: 02/25/2001 Document Revised: 01/16/2015 Document Reviewed: 12/07/2013 Elsevier Interactive Patient Education [employer].

## 2015-12-11 ENCOUNTER — Telehealth: Payer: Self-pay | Admitting: *Deleted

## 2015-12-11 NOTE — Telephone Encounter (Signed)
Michelle Vang was seen earlier in March 2017; memory pills were doubled at March 2017 visit; Michelle Vang does not see any improvement with pt; pt forgets to take meds and Michelle Vang can see more changes in pt and does want to get neurological referral. Michelle Vang said OK to wait on Michelle Echevaria NP return on 12/13/15. Michelle Vang will wait for cb from pt care coordinator about appt. If pt condition were to change prior to cb;Michelle Vang will call Michelle Vang.

## 2015-12-11 NOTE — Telephone Encounter (Signed)
Pt's daughter left voicemail at [facility] saying she is returning Rena's call, pt call her ack

## 2015-12-12 ENCOUNTER — Other Ambulatory Visit: Payer: Self-pay | Admitting: Internal Medicine

## 2015-12-12 DIAGNOSIS — R4189 Other symptoms and signs involving cognitive functions and awareness: Secondary | ICD-10-CM

## 2015-12-12 NOTE — Telephone Encounter (Signed)
Referral placed.

## 2015-12-24 ENCOUNTER — Ambulatory Visit (INDEPENDENT_AMBULATORY_CARE_PROVIDER_SITE_OTHER): Payer: Medicare Other | Admitting: Neurology

## 2015-12-24 ENCOUNTER — Encounter: Payer: Self-pay | Admitting: Neurology

## 2015-12-24 ENCOUNTER — Other Ambulatory Visit (INDEPENDENT_AMBULATORY_CARE_PROVIDER_SITE_OTHER): Payer: Medicare Other

## 2015-12-24 VITALS — BP 132/78 | HR 73 | Resp 18 | Wt 193.0 lb

## 2015-12-24 DIAGNOSIS — G3184 Mild cognitive impairment, so stated: Secondary | ICD-10-CM

## 2015-12-24 DIAGNOSIS — Z8551 Personal history of malignant neoplasm of bladder: Secondary | ICD-10-CM | POA: Diagnosis not present

## 2015-12-24 DIAGNOSIS — Z85118 Personal history of other malignant neoplasm of bronchus and lung: Secondary | ICD-10-CM

## 2015-12-24 LAB — BUN: BUN: 17 mg/dL (ref 6–23)

## 2015-12-24 LAB — CREATININE, SERUM: Creatinine, Ser: 0.78 mg/dL (ref 0.40–1.20)

## 2015-12-24 LAB — TSH: TSH: 1.5 u[IU]/mL (ref 0.35–4.50)

## 2015-12-24 LAB — VITAMIN B12: Vitamin B-12: 379 pg/mL (ref 211–911)

## 2015-12-24 NOTE — Patient Instructions (Signed)
1. Schedule MRI brain with and without contrast 2. Bloodwork for B12, BUN, creatinine 3. Continue Namenda '10mg'$  twice a day 4. Control of blood pressure, cholesterol, as well as physical exercise and brain stimulation exercises are important for brain health 5. Follow-up in 6 months

## 2015-12-24 NOTE — Progress Notes (Signed)
NEUROLOGY CONSULTATION NOTE  Michelle Vang MRN: [id number] DOB: [birth year]  Referring provider: Webb Silversmith, NP Primary care provider: Webb Silversmith, NP  Reason for consult:  Memory loss  Thank you for your kind referral of Michelle Vang for consultation of the above symptoms. Although her history is well known to you, please allow me to reiterate it for the purpose of our medical record. The patient was accompanied to the clinic by her daughter who also provides collateral information. Records and images were personally reviewed where available.  HISTORY OF PRESENT ILLNESS: This is a pleasant 82 year old right-handed woman with a history of hypertension, hypothyroidism, lung cancer s/p partial lobectomy, bladder cancer, melanoma, presenting for evaluation of worsening memory. She lives by herself. Her daughter started noticing memory changes around 5-6 months ago, she would ask the same question or say the same information repeatedly. She feels she is losing her memory but does not want to think it is extensive. She has noticed she misplaces things a lot. She denies any missed bill payments, she occasionally forgets to take her medication but states this is not a regular occurrence. Her daughter took over her checkbook in January because she was very generous giving donations to every cause she got a letter from. She denies leaving the stove on. She continues to drive without getting lost. 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 She is able to keep her hair appointments and pick up her sister without any difficulties. Her daughter denies any personality changes, hygiene is good, no difficulties with ADLs. She was started on Namenda by her PCP 3-4 months ago, increased to '10mg'$  BID last month because daughter reported no improvement with memory. She denies any side effects to Namenda. She has not tried any other medications such as  Aricept.  She has rare dizziness with quick movements. There is frequent constipation. She lost her sense of smell years ago when she had the lung cancer 25 years ago. She denies any headaches, diplopia, dysarthria/dysphagia, neck/back pain, focal numbness/tingling/weakness, bladder dysfunction. No tremors or falls. She denies any family history of dementia, no history of head injuries or alcohol intake.   Laboratory Data: Lab Results  Component Value Date   WBC 7.2 10/02/2015   HGB 12.4 10/02/2015   HCT 38.4 10/02/2015   MCV 87.3 10/02/2015   PLT 286.0 10/02/2015     Chemistry      Component Value Date/Time   NA 140 10/02/2015 0905   NA 140 03/22/2013 0800   K 4.4 10/02/2015 0905   K 4.1 03/22/2013 0800   CL 106 10/02/2015 0905   CL 106 03/22/2013 0800   CO2 30 10/02/2015 0905   CO2 27 03/22/2013 0800   BUN 17 10/02/2015 0905   BUN 15 03/22/2013 0800   CREATININE 0.85 10/02/2015 0905   CREATININE 0.91 03/22/2013 0800      Component Value Date/Time   CALCIUM 9.3 10/02/2015 0905   CALCIUM 9.2 03/22/2013 0800   ALKPHOS 93 10/02/2015 0905   AST 25 10/02/2015 0905   ALT 20 10/02/2015 0905   BILITOT 0.6 10/02/2015 0905     Lab Results  Component Value Date   TSH 2.76 10/02/2015     PAST MEDICAL HISTORY: Past Medical History  Diagnosis Date  . GERD (gastroesophageal reflux disease)   . Thyroid disease   . Hypertension   . Lung cancer ([place])   . Bladder  cancer [facility])     PAST SURGICAL HISTORY: Past Surgical History  Procedure Laterality Date  . Cystoscopy    . Lung cancer surgery      Removed 1 lobe on Right lung  . Skin cancer excision  2014    Melanoma    MEDICATIONS: Current Outpatient Prescriptions on File Prior to Visit  Medication Sig Dispense Refill  . Cholecalciferol (VITAMIN D3) 1000 UNITS CAPS Take 2 capsules by mouth daily. Reported on 11/15/2015    . levothyroxine (SYNTHROID, LEVOTHROID) 88 MCG tablet Take 88 mcg by mouth daily before breakfast.     . losartan (COZAAR) 50 MG tablet Take 1 tablet (50 mg total) by mouth daily. 90 tablet 1  . memantine (NAMENDA) 10 MG tablet Take 1 tablet (10 mg total) by mouth 2 (two) times daily. 60 tablet 2  . omeprazole (PRILOSEC) 20 MG capsule Take 20 mg by mouth as needed.      No current facility-administered medications on file prior to visit.    ALLERGIES: No Known Allergies  FAMILY HISTORY: Family History  Problem Relation Age of Onset  . Stroke Mother   . Heart disease Father   . Cancer Sister     Pancreatic    SOCIAL HISTORY: Social History   Social History  . Marital Status: Widowed    Spouse Name: N/A  . Number of Children: 2  . Years of Education: N/A   Occupational History  . Part time cashier    Social History Main Topics  . Smoking status: Never Smoker   . Smokeless tobacco: Never Used  . Alcohol Use: No  . Drug Use: No  . Sexual Activity: Not Currently   Other Topics Concern  . Not on file   Social History Narrative    REVIEW OF SYSTEMS: Constitutional: No fevers, chills, or sweats, no generalized fatigue, change in appetite Eyes: No visual changes, double vision, eye pain Ear, nose and throat: No hearing loss, ear pain, nasal congestion, sore throat Cardiovascular: No chest pain, palpitations Respiratory:  No shortness of breath at rest or with exertion, wheezes GastrointestinaI: No nausea, vomiting, diarrhea, abdominal pain, fecal incontinence Genitourinary:  No dysuria, urinary retention or frequency Musculoskeletal:  No neck pain, back pain Integumentary: No rash, pruritus, skin lesions Neurological: as above Psychiatric: No depression, insomnia, anxiety Endocrine: No palpitations, fatigue, diaphoresis, mood swings, change in appetite, change in weight, increased thirst Hematologic/Lymphatic:  No anemia, purpura, petechiae. Allergic/Immunologic: no itchy/runny eyes, nasal congestion, recent allergic reactions, rashes  PHYSICAL EXAM: Filed Vitals:    12/24/15 0845  BP: 132/78  Pulse: 73  Resp: 18   General: No acute distress Head:  Normocephalic/atraumatic Eyes: Fundoscopic exam shows bilateral sharp discs, no vessel changes, exudates, or hemorrhages Neck: supple, no paraspinal tenderness, full range of motion Back: No paraspinal tenderness Heart: regular rate and rhythm Lungs: Clear to auscultation bilaterally. Vascular: No carotid bruits. Skin/Extremities: No rash, no edema Neurological Exam: Mental status: alert and oriented to person, place, and time, no dysarthria or aphasia, Fund of knowledge is appropriate.  Remote memory intact.  Attention and concentration are normal.    Able to name objects and repeat phrases. CDT 5/5 MMSE - Mini Mental State Exam 12/24/2015  Orientation to time 4  Orientation to Place 4  Registration 3  Attention/ Calculation 5  Recall 1  Language- name 2 objects 2  Language- repeat 1  Language- follow 3 step command 2  Language- read & follow direction 1  Write a sentence  1  Copy design 1  Total score 25   Cranial nerves: CN I: not tested CN II: pupils equal, round and reactive to light, visual fields intact, fundi unremarkable. CN III, IV, VI:  full range of motion, no nystagmus, no ptosis CN V: facial sensation intact CN VII: upper and lower face symmetric CN VIII: hearing intact to finger rub CN IX, X: gag intact, uvula midline CN XI: sternocleidomastoid and trapezius muscles intact CN XII: tongue midline Bulk & Tone: normal, no fasciculations. Motor: 5/5 throughout with no pronator drift. Sensation: intact to light touch, cold, pin, vibration and joint position sense.  No extinction to double simultaneous stimulation.  Romberg test negative Deep Tendon Reflexes: +2 throughout except for absent ankle jerks bilaterally, no ankle clonus Plantar responses: downgoing bilaterally Cerebellar: no incoordination on finger to nose, heel to shin. No dysdiadochokinesia Gait: narrow-based and  steady but reports soreness in her right upper thigh, difficulty with tandem walk due to thigh soreness Tremor: none  IMPRESSION: This is a pleasant 82 year old right-handed woman with a history of hypertension, hypothyroidism, lung cancer s/p partial lobectomy, bladder cancer, melanoma, presenting for evaluation of worsening memory. Her neurological exam is non-focal, MMSE today 25/30, indicating mild cognitive impairment. We discussed different causes of memory loss. Check B12. With history of different cancer, MRI brain with and without contrast will be ordered to assess for underlying structural abnormality and assess vascular load. She has been started on Namenda '10mg'$  BID but her daughter reports "it is not working." We discussed expectations from memory medications, these medications do not reverse the condition or halt progression, these are more stabilizing agents. They expressed understanding. Since she is already taking Namenda '10mg'$  BID without side effects, would continue on current medication. We discussed the importance of control of vascular risk factors, physical exercise, and brain stimulation exercises for brain health. She will follow-up in 6 months.   Thank you for allowing me to participate in the care of this patient. Please do not hesitate to call for any questions or concerns.   Michelle Vang, M.D.  CC: Webb Silversmith, NP

## 2015-12-26 ENCOUNTER — Telehealth: Payer: Self-pay | Admitting: Family Medicine

## 2015-12-26 NOTE — Telephone Encounter (Signed)
Patient's daughter/Penny was notified of results.

## 2015-12-26 NOTE — Telephone Encounter (Signed)
-----   Message from Cameron Sprang, MD sent at 12/24/2015  5:12 PM EDT ----- Pls let him know thyroid and B12 levels are normal. Thanks

## 2016-01-01 ENCOUNTER — Other Ambulatory Visit: Payer: Self-pay | Admitting: Internal Medicine

## 2016-01-01 MED ORDER — LEVOTHYROXINE SODIUM 88 MCG PO TABS: 88.0000 ug | ORAL_TABLET | Freq: Every day | ORAL | Status: DC

## 2016-01-01 NOTE — Telephone Encounter (Signed)
Michelle Vang pts daughter (DPR signed) left v/m requesting status of synthroid.Savage request cb.

## 2016-01-01 NOTE — Telephone Encounter (Signed)
Received refill fax request for   levothyroxine (SYNTHROID, LEVOTHROID) 88 MCG tablet  Prescription has not been filled by [facility] yet. Patient was getting this prescription from Dr Laurian Brim.  Last seen on 11/15/2015. No future appointment.

## 2016-01-01 NOTE — Telephone Encounter (Signed)
Medication sent electronically

## 2016-01-02 ENCOUNTER — Other Ambulatory Visit: Payer: Medicare Other

## 2016-01-02 ENCOUNTER — Ambulatory Visit: Admission: RE | Admit: 2016-01-02 | Payer: Medicare Other | Source: Ambulatory Visit

## 2016-01-02 NOTE — Telephone Encounter (Signed)
Michelle Vang notified med was refilled on 01/01/16. Michelle Vang voiced understanding.

## 2016-02-25 ENCOUNTER — Telehealth: Payer: Self-pay

## 2016-02-25 ENCOUNTER — Other Ambulatory Visit: Payer: Self-pay | Admitting: Internal Medicine

## 2016-02-25 NOTE — Telephone Encounter (Signed)
noted 

## 2016-02-25 NOTE — Telephone Encounter (Addendum)
Pt walked in; earlier this morning pt had short episode of dizziness; pt feels OK now (no dizziness, pain or other symptoms now) and was on her way to [employer] and decided to ck in for appt. No available appts at [facility] today but Offered appt at another [facility] office today but pt requested and early morning appt on 02/26/16 with Avie Echevaria NP. Pt scheduled appt on 02/26/16 at 8:45 with Avie Echevaria NP. If condition changes or worsens prior to appt pt will go to [facility].FYI to Avie Echevaria NP.

## 2016-02-26 ENCOUNTER — Ambulatory Visit: Payer: Medicare Other | Admitting: Internal Medicine

## 2016-07-21 ENCOUNTER — Ambulatory Visit: Payer: Medicare Other | Admitting: Neurology

## 2016-07-28 ENCOUNTER — Other Ambulatory Visit: Payer: Self-pay | Admitting: Internal Medicine

## 2016-08-05 ENCOUNTER — Encounter: Payer: Self-pay | Admitting: Internal Medicine

## 2016-08-05 ENCOUNTER — Ambulatory Visit (INDEPENDENT_AMBULATORY_CARE_PROVIDER_SITE_OTHER): Payer: Medicare Other | Admitting: Internal Medicine

## 2016-08-05 VITALS — BP 130/66 | HR 78 | Temp 98.3°F | Wt 192.0 lb

## 2016-08-05 DIAGNOSIS — N390 Urinary tract infection, site not specified: Secondary | ICD-10-CM | POA: Diagnosis not present

## 2016-08-05 DIAGNOSIS — J301 Allergic rhinitis due to pollen: Secondary | ICD-10-CM

## 2016-08-05 DIAGNOSIS — R351 Nocturia: Secondary | ICD-10-CM | POA: Diagnosis not present

## 2016-08-05 MED ORDER — NITROFURANTOIN MONOHYD MACRO 100 MG PO CAPS: 100.0000 mg | ORAL_CAPSULE | Freq: Two times a day (BID) | ORAL | 0 refills | Status: DC

## 2016-08-05 NOTE — Progress Notes (Signed)
Subjective:    Patient ID: Michelle Vang, female    DOB: 16-Apr-1929, 82 y.o.   MRN: [id number]  HPI   Pt presents to the clinic today with c/o runny nose and cough. This started 1 week ago. She reports that her symptoms have lessened over the last couple of days, however due to the time of year and her PMH (lung CA with left lung removal) she wanted to be seen. She is blowing clear mucous out of her nose. The cough is non-productive cough and seems to be worse at night. She denies fever, chills or body aches. She has been taking Mucinex OTC with moderate relief.   Additionally, the patients daughter states that she has been complaining of nocturia and is requesting a urinalysis at this time. Shedenies other urinary symptoms such as urgency, dysuria or blood in her urine. History of remote bladder CA.     Review of Systems  Past Medical History:  Diagnosis Date  . Bladder cancer ([place])   . GERD (gastroesophageal reflux disease)   . Hypertension   . Lung cancer ([place])   . Thyroid disease     Current Outpatient Prescriptions  Medication Sig Dispense Refill  . Cholecalciferol (VITAMIN D3) 1000 UNITS CAPS Take 2 capsules by mouth daily. Reported on 11/15/2015    . levothyroxine (SYNTHROID, LEVOTHROID) 88 MCG tablet TAKE 1 TABLET BY MOUTH ONCE A DAY 30 tablet 11  . losartan (COZAAR) 50 MG tablet TAKE 1 TABLET BY MOUTH ONCE A DAY 90 tablet 0  . memantine (NAMENDA) 10 MG tablet TAKE 1 TABLET BY MOUTH TWICE A DAY 60 tablet 6  . omeprazole (PRILOSEC) 20 MG capsule Take 20 mg by mouth as needed.     . nitrofurantoin, macrocrystal-monohydrate, (MACROBID) 100 MG capsule Take 1 capsule (100 mg total) by mouth 2 (two) times daily. 10 capsule 0   No current facility-administered medications for this visit.     No Known Allergies  Family History  Problem Relation Age of Onset  . Stroke Mother   . Heart disease Father   . Cancer Sister     Pancreatic    Social History   Social History  .  Marital status: Widowed    Spouse name: N/A  . Number of children: 2  . Years of education: N/A   Occupational History  . Part time cashier    Social History Main Topics  . Smoking status: Never Smoker  . Smokeless tobacco: Never Used  . Alcohol use No  . Drug use: No  . Sexual activity: Not Currently   Other Topics Concern  . Not on file   Social History Narrative  . No narrative on file     Constitutional: Denies fever, malaise, fatigue, headache or abrupt weight changes.  HEENT: Pt reports runny nose. Denies eye pain, ear pain, nasal congestion, or sore throat. Respiratory: Pt reports cough. Denies difficulty breathing or shortness of breath. Cardiovascular: Denies chest pain, chest tightness, palpitations or swelling in the hands or feet.  GU: Pt reports nocturia. Denies urgency, pain with urination, burning sensation, blood in urine, odor or discharge.    No other specific complaints in a complete review of systems (except as listed in HPI above).     Objective:   Physical Exam  BP 130/66   Pulse 78   Temp 98.3 F (36.8 C) (Oral)   Wt 192 lb (87.1 kg)   SpO2 97%   BMI 32.45 kg/m  Wt Readings from  Last 3 Encounters:  08/05/16 192 lb (87.1 kg)  12/24/15 193 lb (87.5 kg)  12/07/15 197 lb 6 oz (89.5 kg)    General: Appears heer stated age, well developed, well nourished in NAD. HEENT: Head: normal shape and size;  Nose: mucosa pink and moist, septum midline; Throat/Mouth: Teeth present, mucosa pink and moist, no exudate, lesions or ulcerations noted.  Neck:  No adenopathy noted. Pulmonary/Chest: Normal effort and positive vesicular breath sounds. No respiratory distress. No wheezes, rales or ronchi noted.  Abdominal: Soft and nontender. Active bowel sounds. No CVA tenderness.   BMET    Component Value Date/Time   NA 140 10/02/2015 0905   NA 140 03/22/2013 0800   K 4.4 10/02/2015 0905   K 4.1 03/22/2013 0800   CL 106 10/02/2015 0905   CL 106 03/22/2013  0800   CO2 30 10/02/2015 0905   CO2 27 03/22/2013 0800   GLUCOSE 105 (H) 10/02/2015 0905   GLUCOSE 104 (H) 03/22/2013 0800   BUN 17 12/24/2015 1029   BUN 15 03/22/2013 0800   CREATININE 0.78 12/24/2015 1029   CREATININE 0.91 03/22/2013 0800   CALCIUM 9.3 10/02/2015 0905   CALCIUM 9.2 03/22/2013 0800   GFRNONAA 59 (L) 03/22/2013 0800   GFRAA >60 03/22/2013 0800    Lipid Panel     Component Value Date/Time   CHOL 190 10/02/2015 0905   TRIG 142.0 10/02/2015 0905   HDL 41.00 10/02/2015 0905   CHOLHDL 5 10/02/2015 0905   VLDL 28.4 10/02/2015 0905   LDLCALC 120 (H) 10/02/2015 0905    CBC    Component Value Date/Time   WBC 7.2 10/02/2015 0905   RBC 4.40 10/02/2015 0905   HGB 12.4 10/02/2015 0905   HGB 13.0 09/22/2012 0934   HCT 38.4 10/02/2015 0905   HCT 39.4 09/22/2012 0934   PLT 286.0 10/02/2015 0905   PLT 253 09/22/2012 0934   MCV 87.3 10/02/2015 0905   MCV 91 09/22/2012 0934   MCH 30.1 09/22/2012 0934   MCHC 32.2 10/02/2015 0905   RDW 14.9 10/02/2015 0905   RDW 13.0 09/22/2012 0934   LYMPHSABS 1.7 09/22/2012 0934   MONOABS 0.7 09/22/2012 0934   EOSABS 0.2 09/22/2012 0934   BASOSABS 0.1 09/22/2012 0934    Hgb A1C No results found for: HGBA1C          Assessment & Plan:  Allergic rhinitis:  Start Claritin OTC daily x 1 week  Nocturia, secondary to UTI:  Urinalysis: 3+ leuks, 1+ blood, pos nitrites eRx for Macrobid BID x 5 days Increase fluid intake   RTC as needed or if symptoms persist or worsen Catalino Plascencia, NP

## 2016-08-05 NOTE — Patient Instructions (Signed)
Interstitial Cystitis Introduction Interstitial cystitis is a condition that causes inflammation of the bladder. The bladder is a hollow organ in the lower part of your abdomen. It stores urine after the urine is made by your kidneys. With interstitial cystitis, you may have pain in the bladder area. You may also have a frequent and urgent need to urinate. The severity of interstitial cystitis can vary from person to person. You may have flare-ups of the condition, and then it may go away for a while. For many people who have this condition, it becomes a long-term problem. What are the causes? The cause of this condition is not known. What increases the risk? This condition is more likely to develop in women. What are the signs or symptoms? Symptoms of interstitial cystitis vary, and they can change over time. Symptoms may include:  Discomfort or pain in the bladder area. This can range from mild to severe. The pain may change in intensity as the bladder fills with urine or as it empties.  Pelvic pain.  An urgent need to urinate.  Frequent urination.  Pain during sexual intercourse.  Pinpoint bleeding on the bladder wall. For women, the symptoms often get worse during menstruation. How is this diagnosed? This condition is diagnosed by evaluating your symptoms and ruling out other causes. A physical exam will be done. Various tests may be done to rule out other conditions. Common tests include:  Urine tests.  Cystoscopy. In this test, a tool that is like a very thin telescope is used to look into your bladder.  Biopsy. This involves taking a sample of tissue from the bladder wall to be examined under a microscope. How is this treated? There is no cure for interstitial cystitis, but treatment methods are available to control your symptoms. Work closely with your health care provider to find the treatments that will be most effective for you. Treatment options may include:  Medicines  to relieve pain and to help reduce the number of times that you feel the need to urinate.  Bladder training. This involves learning ways to control when you urinate, such as:  Urinating at scheduled times.  Training yourself to delay urination.  Doing exercises (Kegel exercises) to strengthen the muscles that control urine flow.  Lifestyle changes, such as changing your diet or taking steps to control stress.  Use of a device that provides electrical stimulation in order to reduce pain.  A procedure that stretches your bladder by filling it with air or fluid.  Surgery. This is rare. It is only done for extreme cases if other treatments do not help. Follow these instructions at home:  Take medicines only as directed by your health care provider.  Use bladder training techniques as directed.  Keep a bladder diary to find out which foods, liquids, or activities make your symptoms worse.  Use your bladder diary to schedule bathroom trips. If you are away from home, plan to be near a bathroom at each of your scheduled times.  Make sure you urinate just before you leave the house and just before you go to bed.  Do Kegel exercises as directed by your health care provider.  Do not drink alcohol.  Do not use any tobacco products, including cigarettes, chewing tobacco, or electronic cigarettes. If you need help quitting, ask your health care provider.  Make dietary changes as directed by your health care provider. You may need to avoid spicy foods and foods that contain a high amount of potassium.  Limit your drinking of beverages that stimulate urination. These include soda, coffee, and tea.  Keep all follow-up visits as directed by your health care provider. This is important. Contact a health care provider if:  Your symptoms do not get better after treatment.  Your pain and discomfort are getting worse.  You have more frequent urges to urinate.  You have a fever. Get help  right away if:  You are not able to control your bladder at all. This information is not intended to replace advice given to you by your health care provider. Make sure you discuss any questions you have with your health care provider. Document Released: 05/02/2004 Document Revised: 02/07/2016 Document Reviewed: 05/09/2014  2017 Elsevier

## 2016-08-06 ENCOUNTER — Ambulatory Visit (INDEPENDENT_AMBULATORY_CARE_PROVIDER_SITE_OTHER): Payer: Medicare Other | Admitting: Neurology

## 2016-08-06 ENCOUNTER — Encounter: Payer: Self-pay | Admitting: Neurology

## 2016-08-06 VITALS — BP 130/72 | HR 87 | Ht 64.5 in | Wt 191.4 lb

## 2016-08-06 DIAGNOSIS — G3184 Mild cognitive impairment, so stated: Secondary | ICD-10-CM | POA: Diagnosis not present

## 2016-08-06 NOTE — Progress Notes (Signed)
NEUROLOGY FOLLOW UP OFFICE NOTE  Michelle Vang [id number]  HISTORY OF PRESENT ILLNESS: I had the pleasure of seeing Michelle Vang in follow-up in the neurology clinic on 08/06/2016.  The patient was last seen 7 months ago for mild cognitive impairment, MMSE in April 2017 was 25/30. She is again accompanied by her daughter who helps supplement the history today.  Records and images were personally reviewed where available. TSH and B12 normal. She has not had the MRI brain done. They report that memory has been unchanged. Her daughter fills her pillbox, but sometimes still has to remind her to take the medication. The patient states she misses doses because she does not want to take them and not because she forgets. Her daughter is in charge of her checkbook. House is clean, no difficulties with ADLs. She continues to drive, they report an incident while driving at night, she forgot where her cousin lived and drove round and round until she ended up coming home. She denies any headaches, diplopia, dysarthria/dysphagia, neck/back pain, focal numbness/tingling/weakness, bladder dysfunction. No tremors or falls. She is tolerating Namenda '10mg'$  BID without side effects.   HPI 4/101/2017: This is a pleasant 82 yo RH woman with a history of hypertension, hypothyroidism, lung cancer s/p partial lobectomy, bladder cancer, melanoma, who presented for worsening memory. She lives by herself. Her daughter started noticing memory changes around 80-6 months ago, she would ask the same question or say the same information repeatedly. She feels she is losing her memory but does not want to think it is extensive. She has noticed she misplaces things a lot. She denies any missed bill payments, she occasionally forgets to take her medication but states this is not a regular occurrence. Her daughter took over her checkbook in January because she was very generous giving donations to every cause she got a letter from. She denies  leaving the stove on. She continues to drive without getting lost. 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 She is able to keep her hair appointments and pick up her sister without any difficulties. Her daughter denies any personality changes, hygiene is good, no difficulties with ADLs. She was started on Namenda by her PCP 3-4 months ago, increased to '10mg'$  BID last month because daughter reported no improvement with memory. She denies any side effects to Namenda. She has not tried any other medications such as Aricept. There is frequent constipation. She lost her sense of smell years ago when she had the lung cancer 25 years ago.  She denies any family history of dementia, no history of head injuries or alcohol intake.   PAST MEDICAL HISTORY: Past Medical History:  Diagnosis Date  . Bladder cancer ([place])   . GERD (gastroesophageal reflux disease)   . Hypertension   . Lung cancer ([place])   . Thyroid disease     MEDICATIONS: Current Outpatient Prescriptions on File Prior to Visit  Medication Sig Dispense Refill  . Cholecalciferol (VITAMIN D3) 1000 UNITS CAPS Take 2 capsules by mouth daily. Reported on 11/15/2015    . levothyroxine (SYNTHROID, LEVOTHROID) 88 MCG tablet TAKE 1 TABLET BY MOUTH ONCE A DAY 30 tablet 11  . losartan (COZAAR) 50 MG tablet TAKE 1 TABLET BY MOUTH ONCE A DAY 90 tablet 0  . memantine (NAMENDA) 10 MG tablet TAKE 1 TABLET BY MOUTH TWICE A DAY 60 tablet 6  . nitrofurantoin, macrocrystal-monohydrate, (MACROBID) 100 MG capsule Take  1 capsule (100 mg total) by mouth 2 (two) times daily. 10 capsule 0  . omeprazole (PRILOSEC) 20 MG capsule Take 20 mg by mouth as needed.      No current facility-administered medications on file prior to visit.     ALLERGIES: No Known Allergies  FAMILY HISTORY: Family History  Problem Relation Age of Onset  . Stroke Mother   . Heart disease Father   . Cancer Sister     Pancreatic     SOCIAL HISTORY: Social History   Social History  . Marital status: Widowed    Spouse name: N/A  . Number of children: 2  . Years of education: N/A   Occupational History  . Part time cashier    Social History Main Topics  . Smoking status: Never Smoker  . Smokeless tobacco: Never Used  . Alcohol use No  . Drug use: No  . Sexual activity: Not Currently   Other Topics Concern  . Not on file   Social History Narrative  . No narrative on file    REVIEW OF SYSTEMS: Constitutional: No fevers, chills, or sweats, no generalized fatigue, change in appetite Eyes: No visual changes, double vision, eye pain Ear, nose and throat: No hearing loss, ear pain, nasal congestion, sore throat Cardiovascular: No chest pain, palpitations Respiratory:  No shortness of breath at rest or with exertion, wheezes GastrointestinaI: No nausea, vomiting, diarrhea, abdominal pain, fecal incontinence Genitourinary:  No dysuria, urinary retention or frequency Musculoskeletal:  No neck pain, back pain Integumentary: No rash, pruritus, skin lesions Neurological: as above Psychiatric: No depression, insomnia, anxiety Endocrine: No palpitations, fatigue, diaphoresis, mood swings, change in appetite, change in weight, increased thirst Hematologic/Lymphatic:  No anemia, purpura, petechiae. Allergic/Immunologic: no itchy/runny eyes, nasal congestion, recent allergic reactions, rashes  PHYSICAL EXAM: Vitals:   08/06/16 1244  BP: 130/72  Pulse: 87   General: No acute distress Head:  Normocephalic/atraumatic Neck: supple, no paraspinal tenderness, full range of motion Heart:  Regular rate and rhythm Lungs:  Clear to auscultation bilaterally Back: No paraspinal tenderness Skin/Extremities: No rash, no edema Neurological Exam: alert and oriented to person, place, and time. No aphasia or dysarthria. Fund of knowledge is appropriate.  Recent and remote memory are intact.  Attention and concentration are  normal.    Able to name objects and repeat phrases.  MMSE - Mini Mental State Exam 08/06/2016 12/24/2015  Orientation to time 4 4  Orientation to Place 4 4  Registration 3 3  Attention/ Calculation 5 5  Recall 2 1  Language- name 2 objects 2 2  Language- repeat 1 1  Language- follow 3 step command 3 2  Language- read & follow direction 1 1  Write a sentence 1 1  Copy design 1 1  Total score 27 25   Cranial nerves: Pupils equal, round, reactive to light.  Extraocular movements intact with no nystagmus. Visual fields full. Facial sensation intact. No facial asymmetry. Tongue, uvula, palate midline.  Motor: Bulk and tone normal, muscle strength 5/5 throughout with no pronator drift.  Sensation to light touch intact.  No extinction to double simultaneous stimulation.  Deep tendon reflexes 2+ throughout, toes downgoing.  Finger to nose testing intact.  Gait narrow-based and steady, able to tandem walk adequately.  Romberg negative.  IMPRESSION: This is a pleasant 82 yo RH woman with a history of hypertension, hypothyroidism, lung cancer s/p partial lobectomy, bladder cancer, melanoma, with worsening memory. Her neurological exam is non-focal, MMSE today 27/30 (25/30  in April 2017). Symptoms suggestive of mild cognitive impairment. Continue Namenda '10mg'$  BID which was started by her PCP. We discussed medication compliance, she will start actively taking it for a month, if she still misses doses, we will refer her for a visiting nurse to help with medication management. Continue to monitor driving, she was instructed not to drive in poor driving conditions. We again discussed the importance of control of vascular risk factors, physical exercise, and brain stimulation exercises for brain health. She will follow-up in 6 months.  Thank you for allowing me to participate in her care.  Please do not hesitate to call for any questions or concerns.  The duration of this appointment visit was 25 minutes of  face-to-face time with the patient.  Greater than 50% of this time was spent in counseling, explanation of diagnosis, planning of further management, and coordination of care.   Ellouise Newer, M.D.   CC: Webb Silversmith, NP

## 2016-08-06 NOTE — Patient Instructions (Signed)
1. Continue Namenda '10mg'$  twice a day 2. For the next month, start taking your medication regularly. If we still have gaps in the pillbox, recommend we start getting a visiting nurse to help with medications 3. Control of blood pressure, cholesterol, as well as physical exercise and brain stimulation exercises are important for brain health. 4. Continue to monitor driving, no driving in poor driving conditions 5. Follow-up in 6 monhts

## 2016-09-17 ENCOUNTER — Other Ambulatory Visit: Payer: Self-pay | Admitting: Internal Medicine

## 2016-10-02 ENCOUNTER — Encounter: Payer: Medicare Other | Admitting: Internal Medicine

## 2016-10-24 ENCOUNTER — Encounter: Payer: Self-pay | Admitting: Internal Medicine

## 2016-10-24 ENCOUNTER — Encounter (INDEPENDENT_AMBULATORY_CARE_PROVIDER_SITE_OTHER): Payer: Self-pay

## 2016-10-24 ENCOUNTER — Ambulatory Visit (INDEPENDENT_AMBULATORY_CARE_PROVIDER_SITE_OTHER): Payer: Medicare Other | Admitting: Internal Medicine

## 2016-10-24 VITALS — BP 128/74 | HR 76 | Temp 98.0°F | Ht 65.0 in | Wt 199.0 lb

## 2016-10-24 DIAGNOSIS — Z23 Encounter for immunization: Secondary | ICD-10-CM | POA: Diagnosis not present

## 2016-10-24 DIAGNOSIS — Z8551 Personal history of malignant neoplasm of bladder: Secondary | ICD-10-CM

## 2016-10-24 DIAGNOSIS — Z85118 Personal history of other malignant neoplasm of bronchus and lung: Secondary | ICD-10-CM

## 2016-10-24 DIAGNOSIS — G3184 Mild cognitive impairment, so stated: Secondary | ICD-10-CM | POA: Diagnosis not present

## 2016-10-24 DIAGNOSIS — I10 Essential (primary) hypertension: Secondary | ICD-10-CM | POA: Diagnosis not present

## 2016-10-24 DIAGNOSIS — Z1321 Encounter for screening for nutritional disorder: Secondary | ICD-10-CM

## 2016-10-24 DIAGNOSIS — Z0000 Encounter for general adult medical examination without abnormal findings: Secondary | ICD-10-CM | POA: Diagnosis not present

## 2016-10-24 DIAGNOSIS — K219 Gastro-esophageal reflux disease without esophagitis: Secondary | ICD-10-CM | POA: Diagnosis not present

## 2016-10-24 DIAGNOSIS — E039 Hypothyroidism, unspecified: Secondary | ICD-10-CM | POA: Diagnosis not present

## 2016-10-24 LAB — CBC
HEMATOCRIT: 41.4 % (ref 35.0–45.0)
HEMOGLOBIN: 13.8 g/dL (ref 11.7–15.5)
MCH: 30.4 pg (ref 27.0–33.0)
MCHC: 33.3 g/dL (ref 32.0–36.0)
MCV: 91.2 fL (ref 80.0–100.0)
MPV: 9.5 fL (ref 7.5–12.5)
Platelets: 268 10*3/uL (ref 140–400)
RBC: 4.54 MIL/uL (ref 3.80–5.10)
RDW: 13.7 % (ref 11.0–15.0)
WBC: 9.6 10*3/uL (ref 3.8–10.8)

## 2016-10-24 LAB — T4, FREE: FREE T4: 1 ng/dL (ref 0.8–1.8)

## 2016-10-24 LAB — TSH: TSH: 2.69 m[IU]/L

## 2016-10-24 NOTE — Patient Instructions (Signed)

## 2016-10-24 NOTE — Addendum Note (Signed)
Addended by: Lurlean Nanny on: 10/24/2016 03:47 PM   Modules accepted: Orders

## 2016-10-24 NOTE — Assessment & Plan Note (Signed)
Controlled on Losartan CBC and CMET today 

## 2016-10-24 NOTE — Assessment & Plan Note (Signed)
Following with neuro Continue Namenda for now

## 2016-10-24 NOTE — Assessment & Plan Note (Signed)
Will check TSH and T4 today Will adjust Synthroid if needed based on labs

## 2016-10-24 NOTE — Assessment & Plan Note (Signed)
She continues to follow with urology yearly

## 2016-10-24 NOTE — Progress Notes (Signed)
HPI:  Pt presents to the clinic today for her Medicare Wellness Exam. She is also due for follow up chronic conditions.  History of Bladder Cancer: She was Dr. Valentina Gu for this. They are scrapping the wall of the bladder through a cystoscopy. She did not have tumor removal, chemo or radiation.   History of Lung Cancer: 20 years ago. She had resection with left lobectomy of her lung.  GERD: Triggered by tomato based foods. This occurs a few times a month, not daily. She takes Prilosec with good relief.   HTN: Her BP today is 128/74. She is taking Losartan as  Prescribed. ECG from 06/2010 reviewed.  Hypothyroidism: TSH from 12/2015 reviewed. She is taking her Synthroid as prescribed.   MCI: She is taking Namenda as prescribed. They has noticed that it has helped some. She following with Dr. Delice Lesch.   Past Medical History:  Diagnosis Date  . Bladder cancer ([place])   . GERD (gastroesophageal reflux disease)   . Hypertension   . Lung cancer ([place])   . Thyroid disease     Current Outpatient Prescriptions  Medication Sig Dispense Refill  . Cholecalciferol (VITAMIN D3) 1000 UNITS CAPS Take 2 capsules by mouth daily. Reported on 11/15/2015    . levothyroxine (SYNTHROID, LEVOTHROID) 88 MCG tablet TAKE 1 TABLET BY MOUTH ONCE A DAY 30 tablet 11  . losartan (COZAAR) 50 MG tablet TAKE 1 TABLET BY MOUTH ONCE A DAY 90 tablet 0  . memantine (NAMENDA) 10 MG tablet TAKE 1 TABLET BY MOUTH TWICE A DAY 60 tablet 1  . nitrofurantoin, macrocrystal-monohydrate, (MACROBID) 100 MG capsule Take 1 capsule (100 mg total) by mouth 2 (two) times daily. 10 capsule 0  . omeprazole (PRILOSEC) 20 MG capsule Take 20 mg by mouth as needed.      No current facility-administered medications for this visit.     No Known Allergies  Family History  Problem Relation Age of Onset  . Stroke Mother   . Heart disease Father   . Cancer Sister     Pancreatic    Social History   Social History  . Marital status: Widowed   Spouse name: N/A  . Number of children: 2  . Years of education: N/A   Occupational History  . Part time cashier    Social History Main Topics  . Smoking status: Never Smoker  . Smokeless tobacco: Never Used  . Alcohol use No  . Drug use: No  . Sexual activity: Not Currently   Other Topics Concern  . Not on file   Social History Narrative  . No narrative on file    Hospitiliaztions: None  Health Maintenance:    Flu: 05/2016  Tetanus: never  Pneumovax: never  Prevnar: never  Zostavax: never  Mammogram: > 5 years ago  Pap Smear: no longer screening  Bone Density: > 5 years ago  Colon Screening: no longer screening  Eye Doctor: as needed  Dental Exam: as  needed   Providers:   PCP: Webb Silversmith, NP-C  Neurology: Dr. Delice Lesch  Urology: Dr. Brendia Sacks     I have personally reviewed and have noted:  1. The patient's medical and social history 2. Their use of alcohol, tobacco or illicit drugs 3. Their current medications and supplements 4. The patient's functional ability including ADL's, fall risks, home safety risks and hearing or visual impairment. 5. Diet and physical activities 6. Evidence for depression or mood disorder  Subjective:   Review of Systems:   Constitutional:  Denies fever, malaise, fatigue, headache or abrupt weight changes.  HEENT: Denies eye pain, eye redness, ear pain, ringing in the ears, wax buildup, runny nose, nasal congestion, bloody nose, or sore throat. Respiratory: Denies difficulty breathing, shortness of breath, cough or sputum production.   Cardiovascular: Denies chest pain, chest tightness, palpitations or swelling in the hands or feet.  Gastrointestinal: Denies abdominal pain, bloating, constipation, diarrhea or blood in the stool.  GU: Pt reports urinary frequency. Denies urgency, pain with urination, burning sensation, blood in urine, odor or discharge. Musculoskeletal: Denies decrease in range of motion, difficulty with gait,  muscle pain or joint pain and swelling.  Skin: Denies redness, rashes, lesions or ulcercations.  Neurological: Pt reports difficulty with memory. Denies dizziness, difficulty with speech or problems with balance and coordination.  Psych: Denies anxiety, depression, SI/HI.  No other specific complaints in a complete review of systems (except as listed in HPI above).  Objective:  PE:   BP 128/74   Pulse 76   Temp 98 F (36.7 C) (Oral)   Ht '5\' 5"'$  (1.651 m)   Wt 199 lb (90.3 kg)   SpO2 98%   BMI 33.12 kg/m   Wt Readings from Last 3 Encounters:  08/06/16 191 lb 6 oz (86.8 kg)  08/05/16 192 lb (87.1 kg)  12/24/15 193 lb (87.5 kg)    General: Appears her stated age, obese in NAD. Skin: Warm, dry and intact. HEENT: Head: normal shape and size; Eyes: sclera white, no icterus, conjunctiva pink, PERRLA and EOMs intact; Ears: Tm's gray and intact, normal light reflex; Throat/Mouth: Mucosa pink and moist, no exudate, lesions or ulcerations noted.  Neck: Neck supple, trachea midline. No masses, lumps or thyromegaly present.  Cardiovascular: Normal rate and rhythm. S1,S2 noted.  No murmur, rubs or gallops noted. Trace pitting BLE edema. No carotid bruits noted. Pulmonary/Chest: Normal effort and positive vesicular breath sounds. No respiratory distress. No wheezes, rales or ronchi noted.  Abdomen: Soft and nontender. Normal bowel sounds. No distention or masses noted. Liver, spleen and kidneys non palpable. Musculoskeletal: Strength 5/5 BUE/BLE. No signs of joint swelling.  Neurological: Alert and oriented. Obvious trouble with recall. Cranial nerves II-XII grossly intact. Coordination normal.  Psychiatric: Mood and affect normal. Behavior is normal. Judgment and thought content normal.     BMET    Component Value Date/Time   NA 140 10/02/2015 0905   NA 140 03/22/2013 0800   K 4.4 10/02/2015 0905   K 4.1 03/22/2013 0800   CL 106 10/02/2015 0905   CL 106 03/22/2013 0800   CO2 30  10/02/2015 0905   CO2 27 03/22/2013 0800   GLUCOSE 105 (H) 10/02/2015 0905   GLUCOSE 104 (H) 03/22/2013 0800   BUN 17 12/24/2015 1029   BUN 15 03/22/2013 0800   CREATININE 0.78 12/24/2015 1029   CREATININE 0.91 03/22/2013 0800   CALCIUM 9.3 10/02/2015 0905   CALCIUM 9.2 03/22/2013 0800   GFRNONAA 59 (L) 03/22/2013 0800   GFRAA >60 03/22/2013 0800    Lipid Panel     Component Value Date/Time   CHOL 190 10/02/2015 0905   TRIG 142.0 10/02/2015 0905   HDL 41.00 10/02/2015 0905   CHOLHDL 5 10/02/2015 0905   VLDL 28.4 10/02/2015 0905   LDLCALC 120 (H) 10/02/2015 0905    CBC    Component Value Date/Time   WBC 7.2 10/02/2015 0905   RBC 4.40 10/02/2015 0905   HGB 12.4 10/02/2015 0905   HGB 13.0 09/22/2012 0934  HCT 38.4 10/02/2015 0905   HCT 39.4 09/22/2012 0934   PLT 286.0 10/02/2015 0905   PLT 253 09/22/2012 0934   MCV 87.3 10/02/2015 0905   MCV 91 09/22/2012 0934   MCH 30.1 09/22/2012 0934   MCHC 32.2 10/02/2015 0905   RDW 14.9 10/02/2015 0905   RDW 13.0 09/22/2012 0934   LYMPHSABS 1.7 09/22/2012 0934   MONOABS 0.7 09/22/2012 0934   EOSABS 0.2 09/22/2012 0934   BASOSABS 0.1 09/22/2012 0934    Hgb A1C No results found for: HGBA1C    Assessment and Plan:   Medicare Annual Wellness Visit:  Diet: She does eat meat. She consumes fruits and veggies daily. She does eat some fried food. She drinks mostly sweet tea and water. Physical activity: None Depression/mood screen: Negative Hearing: Intact to whispered voice Visual acuity: Grossly normal ADLs: Capable Fall risk: None Home safety: Good Cognitive evaluation: She has issues with short term memory, following with neurologn EOL planning: Adv directives, full code/ I agree  Preventative Medicine: Flu shot UTD. Prevnar today. Will give Pneumovax in 1 year. She does not want tetanus because Medicare won't cover. Her daughter will call insurance about Shingles vaccine. She no longer wants to screen for breast,  cervical or colon cancer. She declines bone density. Encouraged her to consume a balanced diet and exercise regimen. Advised her to see an eye doctor and dentist annually.   Next appointment: 1 year   Webb Silversmith, NP

## 2016-10-24 NOTE — Assessment & Plan Note (Signed)
No further treatment needed

## 2016-10-24 NOTE — Assessment & Plan Note (Signed)
Avoid triggers Continue Prilosec prn CBC and CMET today

## 2016-10-25 LAB — LIPID PANEL
Cholesterol: 216 mg/dL — ABNORMAL HIGH (ref <200)
HDL: 47 mg/dL — ABNORMAL LOW (ref >50)
LDL Cholesterol: 128 mg/dL — ABNORMAL HIGH (ref <100)
Total CHOL/HDL Ratio: 4.6 Ratio (ref <5.0)
Triglycerides: 204 mg/dL — ABNORMAL HIGH (ref <150)
VLDL: 41 mg/dL — ABNORMAL HIGH (ref <30)

## 2016-10-25 LAB — COMPREHENSIVE METABOLIC PANEL
ALBUMIN: 3.9 g/dL (ref 3.6–5.1)
ALK PHOS: 95 U/L (ref 33–130)
ALT: 23 U/L (ref 6–29)
AST: 28 U/L (ref 10–35)
BILIRUBIN TOTAL: 0.6 mg/dL (ref 0.2–1.2)
BUN: 17 mg/dL (ref 7–25)
CALCIUM: 10 mg/dL (ref 8.6–10.4)
CO2: 28 mmol/L (ref 20–31)
Chloride: 104 mmol/L (ref 98–110)
Creat: 0.91 mg/dL — ABNORMAL HIGH (ref 0.60–0.88)
GLUCOSE: 134 mg/dL — AB (ref 65–99)
POTASSIUM: 4.9 mmol/L (ref 3.5–5.3)
Sodium: 140 mmol/L (ref 135–146)
Total Protein: 7.3 g/dL (ref 6.1–8.1)

## 2016-10-25 LAB — VITAMIN D 25 HYDROXY (VIT D DEFICIENCY, FRACTURES): Vit D, 25-Hydroxy: 31 ng/mL (ref 30–100)

## 2016-10-28 ENCOUNTER — Other Ambulatory Visit: Payer: Self-pay | Admitting: Internal Medicine

## 2016-11-25 ENCOUNTER — Other Ambulatory Visit: Payer: Self-pay | Admitting: Internal Medicine

## 2017-02-03 ENCOUNTER — Ambulatory Visit: Payer: Medicare Other | Admitting: Neurology

## 2017-02-16 ENCOUNTER — Ambulatory Visit (INDEPENDENT_AMBULATORY_CARE_PROVIDER_SITE_OTHER): Payer: Medicare Other | Admitting: Neurology

## 2017-02-16 ENCOUNTER — Encounter: Payer: Self-pay | Admitting: Neurology

## 2017-02-16 VITALS — BP 126/58 | HR 81 | Ht 67.0 in | Wt 200.0 lb

## 2017-02-16 DIAGNOSIS — G3184 Mild cognitive impairment, so stated: Secondary | ICD-10-CM | POA: Diagnosis not present

## 2017-02-16 MED ORDER — MEMANTINE HCL 10 MG PO TABS: ORAL_TABLET | ORAL | 3 refills | Status: DC

## 2017-02-16 NOTE — Progress Notes (Signed)
NEUROLOGY FOLLOW UP OFFICE NOTE  Michelle Vang [id number]  HISTORY OF PRESENT ILLNESS: I had the pleasure of seeing Michelle Vang in follow-up in the neurology clinic on 02/17/2017.  The patient was last seen 7 months ago for mild cognitive impairment, MMSE in November 2017 was 27/30 (25/30 in April 2017). She is again accompanied by her daughter who helps supplement the history today.  She was started on Namenda 10mg  BID by her PCP, which she is taking without any side effects. She states her memory is about the same, her daughter reports it's okay, sometimes it depends on what they are talking about. Her daughter fills her pillbox, she forgets her medication sometimes but her daughter calls her daily to remind her. Her daughter is in charge of bill payments. She continues to drive and denies getting lost driving. Her daughter reminds her of getting lost a couple of times going to a friend's house, she states she missed a turn but was able to get back. Her daughter does not have driving concerns when driving locally. She continues to live alone and denies any difficulties with ADLs, no hygiene concerns. No hallucinations or personality changes. She denies any headaches, diplopia, dysarthria/dysphagia, neck/back pain, focal numbness/tingling/weakness, bladder dysfunction. No tremors or falls.   HPI 4/101/2017: This is a pleasant 83 yo RH woman with a history of hypertension, hypothyroidism, lung cancer s/p partial lobectomy, bladder cancer, melanoma, who presented for worsening memory. She lives by herself. Her daughter started noticing memory changes around 5-6 months ago, she would ask the same question or say the same information repeatedly. She feels she is losing her memory but does not want to think it is extensive. She has noticed she misplaces things a lot. She denies any missed bill payments, she occasionally forgets to take her medication but states this is not a regular occurrence. Her daughter  took over her checkbook in January because she was very generous giving donations to every cause she got a letter from. She denies leaving the stove on. She continues to drive without getting lost. 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 She is able to keep her hair appointments and pick up her sister without any difficulties. Her daughter denies any personality changes, hygiene is good, no difficulties with ADLs. She was started on Namenda by her PCP 3-4 months ago, increased to 10mg  BID last month because daughter reported no improvement with memory. She denies any side effects to Namenda. She has not tried any other medications such as Aricept. There is frequent constipation. She lost her sense of smell years ago when she had the lung cancer 25 years ago.  She denies any family history of dementia, no history of head injuries or alcohol intake.   PAST MEDICAL HISTORY: Past Medical History:  Diagnosis Date  . Bladder cancer ([place])   . GERD (gastroesophageal reflux disease)   . Hypertension   . Lung cancer ([place])   . Thyroid disease     MEDICATIONS: Current Outpatient Prescriptions on File Prior to Visit  Medication Sig Dispense Refill  . Cholecalciferol (VITAMIN D3) 1000 UNITS CAPS Take 2 capsules by mouth daily. Reported on 11/15/2015    . levothyroxine (SYNTHROID, LEVOTHROID) 88 MCG tablet TAKE 1 TABLET BY MOUTH ONCE A DAY 30 tablet 10  . losartan (COZAAR) 50 MG tablet TAKE 1 TABLET BY MOUTH ONCE A DAY 90 tablet 2  . memantine (NAMENDA) 10  MG tablet TAKE 1 TABLET BY MOUTH TWICE (2) DAILY 60 tablet 10  . omeprazole (PRILOSEC) 20 MG capsule Take 20 mg by mouth as needed.      No current facility-administered medications on file prior to visit.     ALLERGIES: No Known Allergies  FAMILY HISTORY: Family History  Problem Relation Age of Onset  . Stroke Mother   . Heart disease Father   . Cancer Sister        Pancreatic    SOCIAL  HISTORY: Social History   Social History  . Marital status: Widowed    Spouse name: N/A  . Number of children: 2  . Years of education: N/A   Occupational History  . Part time cashier    Social History Main Topics  . Smoking status: Never Smoker  . Smokeless tobacco: Never Used  . Alcohol use No  . Drug use: No  . Sexual activity: Not Currently   Other Topics Concern  . Not on file   Social History Narrative  . No narrative on file    REVIEW OF SYSTEMS: Constitutional: No fevers, chills, or sweats, no generalized fatigue, change in appetite Eyes: No visual changes, double vision, eye pain Ear, nose and throat: No hearing loss, ear pain, nasal congestion, sore throat Cardiovascular: No chest pain, palpitations Respiratory:  No shortness of breath at rest or with exertion, wheezes GastrointestinaI: No nausea, vomiting, diarrhea, abdominal pain, fecal incontinence Genitourinary:  No dysuria, urinary retention or frequency Musculoskeletal:  No neck pain, back pain Integumentary: No rash, pruritus, skin lesions Neurological: as above Psychiatric: No depression, insomnia, anxiety Endocrine: No palpitations, fatigue, diaphoresis, mood swings, change in appetite, change in weight, increased thirst Hematologic/Lymphatic:  No anemia, purpura, petechiae. Allergic/Immunologic: no itchy/runny eyes, nasal congestion, recent allergic reactions, rashes  PHYSICAL EXAM: Vitals:   02/16/17 1521  BP: (!) 126/58  Pulse: 81   General: No acute distress Head:  Normocephalic/atraumatic Neck: supple, no paraspinal tenderness, full range of motion Heart:  Regular rate and rhythm Lungs:  Clear to auscultation bilaterally Back: No paraspinal tenderness Skin/Extremities: No rash, no edema Neurological Exam: alert and oriented to person, place, and year/season. States it is July, Tues the 5th (it is June, Monday, the 4th). No aphasia or dysarthria. Fund of knowledge is appropriate.  Recent  and remote memory intact.  Attention and concentration are normal.    Able to name objects and repeat phrases. CDT 5/5 MMSE - Mini Mental State Exam 02/16/2017 08/06/2016 12/24/2015  Orientation to time 2 4 4   Orientation to Place 5 4 4   Registration 3 3 3   Attention/ Calculation 5 5 5   Recall 3 2 1   Language- name 2 objects 2 2 2   Language- repeat 1 1 1   Language- follow 3 step command 3 3 2   Language- read & follow direction 1 1 1   Write a sentence 1 1 1   Copy design 1 1 1   Total score 27 27 25    Cranial nerves: Pupils equal, round, reactive to light.  Extraocular movements intact with no nystagmus. Visual fields full. Facial sensation intact. No facial asymmetry. Tongue, uvula, palate midline.  Motor: Bulk and tone normal, muscle strength 5/5 throughout with no pronator drift.  Sensation to light touch intact.  No extinction to double simultaneous stimulation.  Deep tendon reflexes 2+ throughout, toes downgoing.  Finger to nose testing intact.  Gait narrow-based and steady, able to tandem walk adequately.  Romberg negative.  IMPRESSION: This is a pleasant 83  yo RH woman with a history of hypertension, hypothyroidism, lung cancer s/p partial lobectomy, bladder cancer, melanoma, with worsening memory. Her neurological exam is non-focal, MMSE today 27/30 (27/30 in November 2017, 25/30 in April 2017). Symptoms suggestive of mild cognitive impairment, possible mild dementia. Continue Namenda 10mg  BID which was started by her PCP. Her daughter is helping her with medications and finances. Continue to monitor driving. We again discussed the importance of control of vascular risk factors, physical exercise, and brain stimulation exercises for brain health. She will follow-up in 1 year and knows to call for any changes.   Thank you for allowing me to participate in her care.  Please do not hesitate to call for any questions or concerns.  The duration of this appointment visit was 25 minutes of  face-to-face time with the patient.  Greater than 50% of this time was spent in counseling, explanation of diagnosis, planning of further management, and coordination of care.   Ellouise Newer, M.D.   CC: Webb Silversmith, NP

## 2017-02-16 NOTE — Patient Instructions (Signed)
1. Continue Namenda 10mg  twice a day 2. Control of blood pressure, cholesterol, as well as physical exercise and brain stimulation exercises are important for brain health 3. Follow-up in 1 year, call for any changes

## 2017-07-23 ENCOUNTER — Other Ambulatory Visit: Payer: Self-pay | Admitting: Internal Medicine

## 2017-07-24 MED ORDER — LOSARTAN POTASSIUM 50 MG PO TABS: 50.0000 mg | ORAL_TABLET | Freq: Every day | ORAL | 1 refills | Status: DC

## 2017-10-09 ENCOUNTER — Ambulatory Visit (INDEPENDENT_AMBULATORY_CARE_PROVIDER_SITE_OTHER): Payer: Medicare Other | Admitting: Family Medicine

## 2017-10-09 ENCOUNTER — Ambulatory Visit: Payer: Medicare Other | Admitting: Family Medicine

## 2017-10-09 ENCOUNTER — Encounter: Payer: Self-pay | Admitting: Family Medicine

## 2017-10-09 VITALS — BP 138/64 | HR 76 | Temp 97.8°F | Wt 198.0 lb

## 2017-10-09 DIAGNOSIS — N3090 Cystitis, unspecified without hematuria: Secondary | ICD-10-CM

## 2017-10-09 DIAGNOSIS — R350 Frequency of micturition: Secondary | ICD-10-CM

## 2017-10-09 LAB — POC URINALSYSI DIPSTICK (AUTOMATED)
Bilirubin, UA: NEGATIVE
Glucose, UA: NEGATIVE
KETONES UA: NEGATIVE
Nitrite, UA: NEGATIVE
PH UA: 6 (ref 5.0–8.0)
PROTEIN UA: NEGATIVE
RBC UA: POSITIVE
SPEC GRAV UA: 1.015 (ref 1.010–1.025)
Urobilinogen, UA: 0.2 E.U./dL

## 2017-10-09 MED ORDER — CEPHALEXIN 500 MG PO CAPS: 500.0000 mg | ORAL_CAPSULE | Freq: Two times a day (BID) | ORAL | 0 refills | Status: DC

## 2017-10-09 NOTE — Progress Notes (Signed)
   Subjective:    Patient ID: Michelle Vang, female    DOB: [birth year], 84 y.o.   MRN: [id number]  HPI This is an 84 yo female, accompanied by her daughter, who presents today with two days of urinary frequency. Daughter thinks it has been a little longer. No dysuria, no back pain, no fever.  No chest pain, no SOB, no cough, no nausea or vomiting. Good appetite.   Past Medical History:  Diagnosis Date  . Bladder cancer ([place])   . GERD (gastroesophageal reflux disease)   . Hypertension   . Lung cancer ([place])   . Thyroid disease    Past Surgical History:  Procedure Laterality Date  . CYSTOSCOPY    . LUNG CANCER SURGERY     Removed 1 lobe on Right lung  . SKIN CANCER EXCISION  2014   Melanoma   Family History  Problem Relation Age of Onset  . Stroke Mother   . Heart disease Father   . Cancer Sister        Pancreatic   Social History   Tobacco Use  . Smoking status: Never Smoker  . Smokeless tobacco: Never Used  Substance Use Topics  . Alcohol use: No    Alcohol/week: 0.0 oz  . Drug use: No      Review of Systems Per HPI    Objective:   Physical Exam Physical Exam  Constitutional: She is oriented to person, place, and time. She appears well-developed and well-nourished. No distress.  HENT:  Head: Normocephalic and atraumatic.  Cardiovascular: Normal rate, regular rhythm and normal heart sounds.   Pulmonary/Chest: Effort normal and breath sounds normal.  Abdominal: Soft. She exhibits no distension. There is no tenderness. There is no rebound, no guarding and no CVA tenderness.  Neurological: She is alert and oriented to person, place, and time.  Skin: Skin is warm and dry. She is not diaphoretic.  Psychiatric: She has a normal mood and affect. Her behavior is normal. Judgment and thought content normal.  Vitals reviewed.   BP 138/64   Pulse 76   Temp 97.8 F (36.6 C) (Oral)   Wt 198 lb (89.8 kg)   SpO2 96%   BMI 31.01 kg/m  Wt Readings from Last 3  Encounters:  10/09/17 198 lb (89.8 kg)  02/16/17 200 lb (90.7 kg)  10/24/16 199 lb (90.3 kg)   Results for orders placed or performed in visit on 10/09/17  POCT Urinalysis Dipstick (Automated)  Result Value Ref Range   Color, UA yellow    Clarity, UA cloudy    Glucose, UA neg    Bilirubin, UA neg    Ketones, UA neg    Spec Grav, UA 1.015 1.010 - 1.025   Blood, UA pos    pH, UA 6.0 5.0 - 8.0   Protein, UA neg    Urobilinogen, UA 0.2 0.2 or 1.0 E.U./dL   Nitrite, UA neg    Leukocytes, UA Large (3+) (A) Negative       Assessment & Plan:  1. Urinary frequency - POCT Urinalysis Dipstick (Automated) - Urine Culture  2. Cystitis - Provided written and verbal information regarding diagnosis and treatment. - RTC/ER instructions reviewed - cephALEXin (KEFLEX) 500 MG capsule; Take 1 capsule (500 mg total) by mouth 2 (two) times daily.  Dispense: 14 capsule; Refill: 0   Clarene Reamer, FNP-BC  [facility] [place]  10/09/2017 4:08 PM

## 2017-10-09 NOTE — Patient Instructions (Signed)
I have sent an antibiotic to your pharmacy, please start tonight   Urinary Tract Infection, Adult A urinary tract infection (UTI) is an infection of any part of the urinary tract. The urinary tract includes the:  Kidneys.  Ureters.  Bladder.  Urethra.  These organs make, store, and get rid of pee (urine) in the body. Follow these instructions at home:  Take over-the-counter and prescription medicines only as told by your doctor.  If you were prescribed an antibiotic medicine, take it as told by your doctor. Do not stop taking the antibiotic even if you start to feel better.  Avoid the following drinks: ? Alcohol. ? Caffeine. ? Tea. ? Carbonated drinks.  Drink enough fluid to keep your pee clear or pale yellow.  Keep all follow-up visits as told by your doctor. This is important.  Make sure to: ? Empty your bladder often and completely. Do not to hold pee for long periods of time. ? Empty your bladder before and after sex. ? Wipe from front to back after a bowel movement if you are female. Use each tissue one time when you wipe. Contact a doctor if:  You have back pain.  You have a fever.  You feel sick to your stomach (nauseous).  You throw up (vomit).  Your symptoms do not get better after 3 days.  Your symptoms go away and then come back. Get help right away if:  You have very bad back pain.  You have very bad lower belly (abdominal) pain.  You are throwing up and cannot keep down any medicines or water. This information is not intended to replace advice given to you by your health care provider. Make sure you discuss any questions you have with your health care provider. Document Released: 02/18/2008 Document Revised: 02/07/2016 Document Reviewed: 07/23/2015 Elsevier Interactive Patient Education  [employer].

## 2017-10-12 LAB — URINE CULTURE
MICRO NUMBER:: 90109054
SPECIMEN QUALITY: ADEQUATE

## 2017-10-20 ENCOUNTER — Other Ambulatory Visit: Payer: Self-pay | Admitting: Internal Medicine

## 2017-10-27 ENCOUNTER — Encounter: Payer: Medicare Other | Admitting: Internal Medicine

## 2017-10-29 ENCOUNTER — Encounter: Payer: Medicare Other | Admitting: Internal Medicine

## 2017-11-23 ENCOUNTER — Other Ambulatory Visit: Payer: Self-pay | Admitting: Internal Medicine

## 2017-12-08 ENCOUNTER — Encounter: Payer: Self-pay | Admitting: Internal Medicine

## 2017-12-08 ENCOUNTER — Ambulatory Visit (INDEPENDENT_AMBULATORY_CARE_PROVIDER_SITE_OTHER): Payer: Medicare Other | Admitting: Internal Medicine

## 2017-12-08 VITALS — BP 126/78 | HR 74 | Temp 98.6°F | Ht 65.0 in | Wt 202.0 lb

## 2017-12-08 DIAGNOSIS — R350 Frequency of micturition: Secondary | ICD-10-CM

## 2017-12-08 DIAGNOSIS — K219 Gastro-esophageal reflux disease without esophagitis: Secondary | ICD-10-CM

## 2017-12-08 DIAGNOSIS — Z0000 Encounter for general adult medical examination without abnormal findings: Secondary | ICD-10-CM

## 2017-12-08 DIAGNOSIS — G3184 Mild cognitive impairment, so stated: Secondary | ICD-10-CM

## 2017-12-08 DIAGNOSIS — Z23 Encounter for immunization: Secondary | ICD-10-CM | POA: Diagnosis not present

## 2017-12-08 DIAGNOSIS — E039 Hypothyroidism, unspecified: Secondary | ICD-10-CM | POA: Diagnosis not present

## 2017-12-08 DIAGNOSIS — E559 Vitamin D deficiency, unspecified: Secondary | ICD-10-CM | POA: Diagnosis not present

## 2017-12-08 DIAGNOSIS — I10 Essential (primary) hypertension: Secondary | ICD-10-CM

## 2017-12-08 LAB — LIPID PANEL
CHOL/HDL RATIO: 4
CHOLESTEROL: 207 mg/dL — AB (ref 0–200)
HDL: 47.3 mg/dL (ref >39.00)
LDL CALC: 124 mg/dL — AB (ref 0–99)
NONHDL: 159.34
Triglycerides: 178 mg/dL — ABNORMAL HIGH (ref 0.0–149.0)
VLDL: 35.6 mg/dL (ref 0.0–40.0)

## 2017-12-08 LAB — COMPREHENSIVE METABOLIC PANEL
ALK PHOS: 111 U/L (ref 39–117)
ALT: 47 U/L — ABNORMAL HIGH (ref 0–35)
AST: 50 U/L — AB (ref 0–37)
Albumin: 3.8 g/dL (ref 3.5–5.2)
BILIRUBIN TOTAL: 0.6 mg/dL (ref 0.2–1.2)
BUN: 23 mg/dL (ref 6–23)
CO2: 25 meq/L (ref 19–32)
Calcium: 9.5 mg/dL (ref 8.4–10.5)
Chloride: 105 mEq/L (ref 96–112)
Creatinine, Ser: 0.89 mg/dL (ref 0.40–1.20)
GFR: 63.75 mL/min (ref >60.00)
GLUCOSE: 147 mg/dL — AB (ref 70–99)
Potassium: 4.5 mEq/L (ref 3.5–5.1)
Sodium: 138 mEq/L (ref 135–145)
TOTAL PROTEIN: 7.6 g/dL (ref 6.0–8.3)

## 2017-12-08 LAB — CBC
HCT: 42.1 % (ref 36.0–46.0)
HEMOGLOBIN: 14.3 g/dL (ref 12.0–15.0)
MCHC: 33.9 g/dL (ref 30.0–36.0)
MCV: 94.2 fl (ref 78.0–100.0)
PLATELETS: 277 10*3/uL (ref 150.0–400.0)
RBC: 4.47 Mil/uL (ref 3.87–5.11)
RDW: 12.8 % (ref 11.5–15.5)
WBC: 8.4 10*3/uL (ref 4.0–10.5)

## 2017-12-08 LAB — TSH: TSH: 2.64 u[IU]/mL (ref 0.35–4.50)

## 2017-12-08 LAB — VITAMIN D 25 HYDROXY (VIT D DEFICIENCY, FRACTURES): VITD: 35.06 ng/mL (ref 30.00–100.00)

## 2017-12-08 LAB — T4, FREE: Free T4: 0.83 ng/dL (ref 0.60–1.60)

## 2017-12-08 NOTE — Assessment & Plan Note (Signed)
Controlled off meds CBC and CMET today Will monitor

## 2017-12-08 NOTE — Assessment & Plan Note (Signed)
TSH and Free T4 today Will adjust Synthroid if needed based on labs 

## 2017-12-08 NOTE — Progress Notes (Signed)
HPI:  Pt presents to the clinic today for her Medicare Wellness Exam. She is also due to follow up chronic conditions.  GERD: Currently not an issue. She is not taking Omeprazole. She denies breakthrough symptoms.  HTN: Her BP today is 126/78. She is taking Losartan as prescribed. ECG from 06/2010 reviewed.  Hypothyroidism: She denies any issues with her current dose of Synthroid. She is due to have labs repeated today.  MCI: Trouble with recall and short term memory. She is taking Namenda as prescribed. Her daughter has noticed some improvement in her memory since she started taking this.  Past Medical History:  Diagnosis Date  . Bladder cancer ([place])   . GERD (gastroesophageal reflux disease)   . Hypertension   . Lung cancer ([place])   . Thyroid disease     Current Outpatient Medications  Medication Sig Dispense Refill  . cephALEXin (KEFLEX) 500 MG capsule Take 1 capsule (500 mg total) by mouth 2 (two) times daily. 14 capsule 0  . Cholecalciferol (VITAMIN D3) 1000 UNITS CAPS Take 2 capsules by mouth daily. Reported on 11/15/2015    . levothyroxine (SYNTHROID, LEVOTHROID) 88 MCG tablet TAKE 1 TABLET BY MOUTH ONCE A DAY 30 tablet 0  . losartan (COZAAR) 50 MG tablet Take 1 tablet (50 mg total) daily by mouth. 90 tablet 1  . memantine (NAMENDA) 10 MG tablet Take 1 tablet twice a day 180 tablet 3  . omeprazole (PRILOSEC) 20 MG capsule Take 20 mg by mouth as needed.      No current facility-administered medications for this visit.     No Known Allergies  Family History  Problem Relation Age of Onset  . Stroke Mother   . Heart disease Father   . Cancer Sister        Pancreatic    Social History   Socioeconomic History  . Marital status: Widowed    Spouse name: Not on file  . Number of children: 2  . Years of education: Not on file  . Highest education level: Not on file  Occupational History  . Occupation: Part time Scientist, water quality  Social Needs  . Financial resource strain: Not on  file  . Food insecurity:    Worry: Not on file    Inability: Not on file  . Transportation needs:    Medical: Not on file    Non-medical: Not on file  Tobacco Use  . Smoking status: Never Smoker  . Smokeless tobacco: Never Used  Substance and Sexual Activity  . Alcohol use: No    Alcohol/week: 0.0 oz  . Drug use: No  . Sexual activity: Not Currently  Lifestyle  . Physical activity:    Days per week: Not on file    Minutes per session: Not on file  . Stress: Not on file  Relationships  . Social connections:    Talks on phone: Not on file    Gets together: Not on file    Attends religious service: Not on file    Active member of club or organization: Not on file    Attends meetings of clubs or organizations: Not on file    Relationship status: Not on file  . Intimate partner violence:    Fear of current or ex partner: Not on file    Emotionally abused: Not on file    Physically abused: Not on file    Forced sexual activity: Not on file  Other Topics Concern  . Not on file  Social History  Narrative  . Not on file    Hospitiliaztions: None  Health Maintenance:    Flu: 05/2017  Tetanus: more than 10 years  Pneumovax:  Prevnar: 10/2016  Zostavax: unsure  Mammogram: no longer screening  Pap Smear: no longer screening  Bone Density: no longer screening  Colon Screening: no longer screening  Eye Doctor: annually  Dental Exam: as needed   Providers:   PCP: Webb Silversmith, NP-C    I have personally reviewed and have noted:  1. The patient's medical and social history 2. Their use of alcohol, tobacco or illicit drugs 3. Their current medications and supplements 4. The patient's functional ability including ADL's, fall risks, home safety risks and hearing or visual impairment. 5. Diet and physical activities 6. Evidence for depression or mood disorder  Subjective:   Review of Systems:   Constitutional: Denies fever, malaise, fatigue, headache or abrupt weight  changes.  HEENT: Denies eye pain, eye redness, ear pain, ringing in the ears, wax buildup, runny nose, nasal congestion, bloody nose, or sore throat. Respiratory: Denies difficulty breathing, shortness of breath, cough or sputum production.   Cardiovascular: Pt reports swelling in legs. Denies chest pain, chest tightness, palpitations or swelling in the hands.  Gastrointestinal: Denies abdominal pain, bloating, constipation, diarrhea or blood in the stool.  GU: Pt reports urinary frequency. Denies urgency, pain with urination, burning sensation, blood in urine, odor or discharge. Musculoskeletal: Denies decrease in range of motion, difficulty with gait, muscle pain or joint pain and swelling.  Skin: Denies redness, rashes, lesions or ulcercations.  Neurological: Pt reports difficulty with memory. Denies dizziness, difficulty with speech or problems with balance and coordination.  Psych: Denies anxiety, depression, SI/HI.  No other specific complaints in a complete review of systems (except as listed in HPI above).  Objective:  PE:   BP 126/78   Pulse 74   Temp 98.6 F (37 C) (Oral)   Ht 5\' 5"  (1.651 m)   Wt 202 lb (91.6 kg)   SpO2 98%   BMI 33.61 kg/m   Wt Readings from Last 3 Encounters:  10/09/17 198 lb (89.8 kg)  02/16/17 200 lb (90.7 kg)  10/24/16 199 lb (90.3 kg)    General: Appears her stated age, obese in NAD. Neck: Neck supple, trachea midline. No masses, lumps present.  Cardiovascular: Normal rate and rhythm. S1,S2 noted.  No murmur, rubs or gallops noted. 1+ pitting BLE edema. No carotid bruits noted. Pulmonary/Chest: Normal effort and positive vesicular breath sounds. No respiratory distress. No wheezes, rales or ronchi noted.  Abdomen: Soft and nontender. Normal bowel sounds.  Musculoskeletal: Gait slow and steady without use of assistive device. Neurological: Alert and oriented. Trouble with short term memory and recall. Psychiatric: Mood and affect normal.  Behavior is normal. Judgment and thought content normal.    BMET    Component Value Date/Time   NA 140 10/24/2016 1502   NA 140 03/22/2013 0800   K 4.9 10/24/2016 1502   K 4.1 03/22/2013 0800   CL 104 10/24/2016 1502   CL 106 03/22/2013 0800   CO2 28 10/24/2016 1502   CO2 27 03/22/2013 0800   GLUCOSE 134 (H) 10/24/2016 1502   GLUCOSE 104 (H) 03/22/2013 0800   BUN 17 10/24/2016 1502   BUN 15 03/22/2013 0800   CREATININE 0.91 (H) 10/24/2016 1502   CALCIUM 10.0 10/24/2016 1502   CALCIUM 9.2 03/22/2013 0800   GFRNONAA 59 (L) 03/22/2013 0800   GFRAA >60 03/22/2013 0800  Lipid Panel     Component Value Date/Time   CHOL 216 (H) 10/24/2016 1502   TRIG 204 (H) 10/24/2016 1502   HDL 47 (L) 10/24/2016 1502   CHOLHDL 4.6 10/24/2016 1502   VLDL 41 (H) 10/24/2016 1502   LDLCALC 128 (H) 10/24/2016 1502    CBC    Component Value Date/Time   WBC 9.6 10/24/2016 1502   RBC 4.54 10/24/2016 1502   HGB 13.8 10/24/2016 1502   HGB 13.0 09/22/2012 0934   HCT 41.4 10/24/2016 1502   HCT 39.4 09/22/2012 0934   PLT 268 10/24/2016 1502   PLT 253 09/22/2012 0934   MCV 91.2 10/24/2016 1502   MCV 91 09/22/2012 0934   MCH 30.4 10/24/2016 1502   MCHC 33.3 10/24/2016 1502   RDW 13.7 10/24/2016 1502   RDW 13.0 09/22/2012 0934   LYMPHSABS 1.7 09/22/2012 0934   MONOABS 0.7 09/22/2012 0934   EOSABS 0.2 09/22/2012 0934   BASOSABS 0.1 09/22/2012 0934    Hgb A1C No results found for: HGBA1C    Assessment and Plan:   Medicare Annual Wellness Visit:  Diet: She does eat meat. She consumes fruits and veggies daily. She does eat fried food. She drinks mostly tea, some water, milk. Physical activity: None Depression/mood screen: Negative Hearing: Intact to whispered voice Visual acuity: Grossly normal, performs annual eye exam  ADLs: Capable Fall risk: None Home safety: Good Cognitive evaluation: Intact to orientation, naming, recall and repetition EOL planning: Adv directives, full  code/ I agree  Preventative Medicine: Flu and prevnar UTD. She declines tetanus. Pneumovax today. She declines Zostovax or Shingrix. She declines pap smear, mammogram, bone density or colon screening. Encouraged her to consume a balanced diet and exercise regimen. Advised her to see an eye doctor and dentist annually. Will check CBC, CMET, Lipid, TSH, Free T4 and Vit D today.  Urinary Frequency:  She was unable to give me a urine sample We will give her a cup and wipe to bring back a sample Push fluids  Next appointment: 1 year, Medicare Wellness Exam   Webb Silversmith, NP

## 2017-12-08 NOTE — Patient Instructions (Signed)
Health Maintenance for Postmenopausal Women Menopause is a normal process in which your reproductive ability comes to an end. This process happens gradually over a span of months to years, usually between the ages of 22 and 9. Menopause is complete when you have missed 12 consecutive menstrual periods. It is important to talk with your health care provider about some of the most common conditions that affect postmenopausal women, such as heart disease, cancer, and bone loss (osteoporosis). Adopting a healthy lifestyle and getting preventive care can help to promote your health and wellness. Those actions can also lower your chances of developing some of these common conditions. What should I know about menopause? During menopause, you may experience a number of symptoms, such as:  Moderate-to-severe hot flashes.  Night sweats.  Decrease in sex drive.  Mood swings.  Headaches.  Tiredness.  Irritability.  Memory problems.  Insomnia.  Choosing to treat or not to treat menopausal changes is an individual decision that you make with your health care provider. What should I know about hormone replacement therapy and supplements? Hormone therapy products are effective for treating symptoms that are associated with menopause, such as hot flashes and night sweats. Hormone replacement carries certain risks, especially as you become older. If you are thinking about using estrogen or estrogen with progestin treatments, discuss the benefits and risks with your health care provider. What should I know about heart disease and stroke? Heart disease, heart attack, and stroke become more likely as you age. This may be due, in part, to the hormonal changes that your body experiences during menopause. These can affect how your body processes dietary fats, triglycerides, and cholesterol. Heart attack and stroke are both medical emergencies. There are many things that you can do to help prevent heart disease  and stroke:  Have your blood pressure checked at least every 1-2 years. High blood pressure causes heart disease and increases the risk of stroke.  If you are 53-22 years old, ask your health care provider if you should take aspirin to prevent a heart attack or a stroke.  Do not use any tobacco products, including cigarettes, chewing tobacco, or electronic cigarettes. If you need help quitting, ask your health care provider.  It is important to eat a healthy diet and maintain a healthy weight. ? Be sure to include plenty of vegetables, fruits, low-fat dairy products, and lean protein. ? Avoid eating foods that are high in solid fats, added sugars, or salt (sodium).  Get regular exercise. This is one of the most important things that you can do for your health. ? Try to exercise for at least 150 minutes each week. The type of exercise that you do should increase your heart rate and make you sweat. This is known as moderate-intensity exercise. ? Try to do strengthening exercises at least twice each week. Do these in addition to the moderate-intensity exercise.  Know your numbers.Ask your health care provider to check your cholesterol and your blood glucose. Continue to have your blood tested as directed by your health care provider.  What should I know about cancer screening? There are several types of cancer. Take the following steps to reduce your risk and to catch any cancer development as early as possible. Breast Cancer  Practice breast self-awareness. ? This means understanding how your breasts normally appear and feel. ? It also means doing regular breast self-exams. Let your health care provider know about any changes, no matter how small.  If you are 40  or older, have a clinician do a breast exam (clinical breast exam or CBE) every year. Depending on your age, family history, and medical history, it may be recommended that you also have a yearly breast X-ray (mammogram).  If you  have a family history of breast cancer, talk with your health care provider about genetic screening.  If you are at high risk for breast cancer, talk with your health care provider about having an MRI and a mammogram every year.  Breast cancer (BRCA) gene test is recommended for women who have family members with BRCA-related cancers. Results of the assessment will determine the need for genetic counseling and BRCA1 and for BRCA2 testing. BRCA-related cancers include these types: ? Breast. This occurs in males or females. ? Ovarian. ? Tubal. This may also be called fallopian tube cancer. ? Cancer of the abdominal or pelvic lining (peritoneal cancer). ? Prostate. ? Pancreatic.  Cervical, Uterine, and Ovarian Cancer Your health care provider may recommend that you be screened regularly for cancer of the pelvic organs. These include your ovaries, uterus, and vagina. This screening involves a pelvic exam, which includes checking for microscopic changes to the surface of your cervix (Pap test).  For women ages 21-65, health care providers may recommend a pelvic exam and a Pap test every three years. For women ages 79-65, they may recommend the Pap test and pelvic exam, combined with testing for human papilloma virus (HPV), every five years. Some types of HPV increase your risk of cervical cancer. Testing for HPV may also be done on women of any age who have unclear Pap test results.  Other health care providers may not recommend any screening for nonpregnant women who are considered low risk for pelvic cancer and have no symptoms. Ask your health care provider if a screening pelvic exam is right for you.  If you have had past treatment for cervical cancer or a condition that could lead to cancer, you need Pap tests and screening for cancer for at least 20 years after your treatment. If Pap tests have been discontinued for you, your risk factors (such as having a new sexual partner) need to be  reassessed to determine if you should start having screenings again. Some women have medical problems that increase the chance of getting cervical cancer. In these cases, your health care provider may recommend that you have screening and Pap tests more often.  If you have a family history of uterine cancer or ovarian cancer, talk with your health care provider about genetic screening.  If you have vaginal bleeding after reaching menopause, tell your health care provider.  There are currently no reliable tests available to screen for ovarian cancer.  Lung Cancer Lung cancer screening is recommended for adults 69-62 years old who are at high risk for lung cancer because of a history of smoking. A yearly low-dose CT scan of the lungs is recommended if you:  Currently smoke.  Have a history of at least 30 pack-years of smoking and you currently smoke or have quit within the past 15 years. A pack-year is smoking an average of one pack of cigarettes per day for one year.  Yearly screening should:  Continue until it has been 15 years since you quit.  Stop if you develop a health problem that would prevent you from having lung cancer treatment.  Colorectal Cancer  This type of cancer can be detected and can often be prevented.  Routine colorectal cancer screening usually begins at  age 42 and continues through age 45.  If you have risk factors for colon cancer, your health care provider may recommend that you be screened at an earlier age.  If you have a family history of colorectal cancer, talk with your health care provider about genetic screening.  Your health care provider may also recommend using home test kits to check for hidden blood in your stool.  A small camera at the end of a tube can be used to examine your colon directly (sigmoidoscopy or colonoscopy). This is done to check for the earliest forms of colorectal cancer.  Direct examination of the colon should be repeated every  5-10 years until age 71. However, if early forms of precancerous polyps or small growths are found or if you have a family history or genetic risk for colorectal cancer, you may need to be screened more often.  Skin Cancer  Check your skin from head to toe regularly.  Monitor any moles. Be sure to tell your health care provider: ? About any new moles or changes in moles, especially if there is a change in a mole's shape or color. ? If you have a mole that is larger than the size of a pencil eraser.  If any of your family members has a history of skin cancer, especially at a young age, talk with your health care provider about genetic screening.  Always use sunscreen. Apply sunscreen liberally and repeatedly throughout the day.  Whenever you are outside, protect yourself by wearing long sleeves, pants, a wide-brimmed hat, and sunglasses.  What should I know about osteoporosis? Osteoporosis is a condition in which bone destruction happens more quickly than new bone creation. After menopause, you may be at an increased risk for osteoporosis. To help prevent osteoporosis or the bone fractures that can happen because of osteoporosis, the following is recommended:  If you are 46-71 years old, get at least 1,000 mg of calcium and at least 600 mg of vitamin D per day.  If you are older than age 55 but younger than age 65, get at least 1,200 mg of calcium and at least 600 mg of vitamin D per day.  If you are older than age 54, get at least 1,200 mg of calcium and at least 800 mg of vitamin D per day.  Smoking and excessive alcohol intake increase the risk of osteoporosis. Eat foods that are rich in calcium and vitamin D, and do weight-bearing exercises several times each week as directed by your health care provider. What should I know about how menopause affects my mental health? Depression may occur at any age, but it is more common as you become older. Common symptoms of depression  include:  Low or sad mood.  Changes in sleep patterns.  Changes in appetite or eating patterns.  Feeling an overall lack of motivation or enjoyment of activities that you previously enjoyed.  Frequent crying spells.  Talk with your health care provider if you think that you are experiencing depression. What should I know about immunizations? It is important that you get and maintain your immunizations. These include:  Tetanus, diphtheria, and pertussis (Tdap) booster vaccine.  Influenza every year before the flu season begins.  Pneumonia vaccine.  Shingles vaccine.  Your health care provider may also recommend other immunizations. This information is not intended to replace advice given to you by your health care provider. Make sure you discuss any questions you have with your health care provider. Document Released: 10/24/2005  Document Revised: 03/21/2016 Document Reviewed: 06/05/2015 Elsevier Interactive Patient Education  2018 [employer]  

## 2017-12-08 NOTE — Assessment & Plan Note (Signed)
Controlled on Namenda Will monitor

## 2017-12-08 NOTE — Addendum Note (Signed)
Addended by: Lurlean Nanny on: 12/08/2017 06:34 PM   Modules accepted: Orders

## 2017-12-08 NOTE — Assessment & Plan Note (Signed)
Controlled on Losartan CBC and CMET today Reinforced DASH diet

## 2017-12-09 ENCOUNTER — Other Ambulatory Visit (INDEPENDENT_AMBULATORY_CARE_PROVIDER_SITE_OTHER): Payer: Medicare Other

## 2017-12-09 DIAGNOSIS — R739 Hyperglycemia, unspecified: Secondary | ICD-10-CM

## 2017-12-09 LAB — POCT URINALYSIS DIPSTICK
BILIRUBIN UA: NEGATIVE
Glucose, UA: NEGATIVE
KETONES UA: NEGATIVE
Leukocytes, UA: NEGATIVE
Nitrite, UA: NEGATIVE
PH UA: 6 (ref 5.0–8.0)
Protein, UA: NEGATIVE
SPEC GRAV UA: 1.02 (ref 1.010–1.025)
Urobilinogen, UA: 0.2 E.U./dL

## 2017-12-09 LAB — HEMOGLOBIN A1C: Hgb A1c MFr Bld: 7.9 % — ABNORMAL HIGH (ref 4.6–6.5)

## 2017-12-09 NOTE — Addendum Note (Signed)
Addended by: Ellamae Sia on: 12/09/2017 10:50 AM   Modules accepted: Orders

## 2017-12-18 ENCOUNTER — Ambulatory Visit (INDEPENDENT_AMBULATORY_CARE_PROVIDER_SITE_OTHER): Payer: Medicare Other | Admitting: Internal Medicine

## 2017-12-18 ENCOUNTER — Encounter: Payer: Self-pay | Admitting: Internal Medicine

## 2017-12-18 VITALS — BP 116/72 | HR 80 | Temp 98.2°F | Ht 65.0 in | Wt 202.0 lb

## 2017-12-18 DIAGNOSIS — E119 Type 2 diabetes mellitus without complications: Secondary | ICD-10-CM

## 2017-12-18 NOTE — Patient Instructions (Signed)
Diabetes Mellitus and Nutrition When you have diabetes (diabetes mellitus), it is very important to have healthy eating habits because your blood sugar (glucose) levels are greatly affected by what you eat and drink. Eating healthy foods in the appropriate amounts, at about the same times every day, can help you:  Control your blood glucose.  Lower your risk of heart disease.  Improve your blood pressure.  Reach or maintain a healthy weight.  Every person with diabetes is different, and each person has different needs for a meal plan. Your health care provider may recommend that you work with a diet and nutrition specialist (dietitian) to make a meal plan that is best for you. Your meal plan may vary depending on factors such as:  The calories you need.  The medicines you take.  Your weight.  Your blood glucose, blood pressure, and cholesterol levels.  Your activity level.  Other health conditions you have, such as heart or kidney disease.  How do carbohydrates affect me? Carbohydrates affect your blood glucose level more than any other type of food. Eating carbohydrates naturally increases the amount of glucose in your blood. Carbohydrate counting is a method for keeping track of how many carbohydrates you eat. Counting carbohydrates is important to keep your blood glucose at a healthy level, especially if you use insulin or take certain oral diabetes medicines. It is important to know how many carbohydrates you can safely have in each meal. This is different for every person. Your dietitian can help you calculate how many carbohydrates you should have at each meal and for snack. Foods that contain carbohydrates include:  Bread, cereal, rice, pasta, and crackers.  Potatoes and corn.  Peas, beans, and lentils.  Milk and yogurt.  Fruit and juice.  Desserts, such as cakes, cookies, ice cream, and candy.  How does alcohol affect me? Alcohol can cause a sudden decrease in blood  glucose (hypoglycemia), especially if you use insulin or take certain oral diabetes medicines. Hypoglycemia can be a life-threatening condition. Symptoms of hypoglycemia (sleepiness, dizziness, and confusion) are similar to symptoms of having too much alcohol. If your health care provider says that alcohol is safe for you, follow these guidelines:  Limit alcohol intake to no more than 1 drink per day for nonpregnant women and 2 drinks per day for men. One drink equals 12 oz of beer, 5 oz of wine, or 1 oz of hard liquor.  Do not drink on an empty stomach.  Keep yourself hydrated with water, diet soda, or unsweetened iced tea.  Keep in mind that regular soda, juice, and other mixers may contain a lot of sugar and must be counted as carbohydrates.  What are tips for following this plan? Reading food labels  Start by checking the serving size on the label. The amount of calories, carbohydrates, fats, and other nutrients listed on the label are based on one serving of the food. Many foods contain more than one serving per package.  Check the total grams (g) of carbohydrates in one serving. You can calculate the number of servings of carbohydrates in one serving by dividing the total carbohydrates by 15. For example, if a food has 30 g of total carbohydrates, it would be equal to 2 servings of carbohydrates.  Check the number of grams (g) of saturated and trans fats in one serving. Choose foods that have low or no amount of these fats.  Check the number of milligrams (mg) of sodium in one serving. Most people   should limit total sodium intake to less than 2,300 mg per day.  Always check the nutrition information of foods labeled as "low-fat" or "nonfat". These foods may be higher in added sugar or refined carbohydrates and should be avoided.  Talk to your dietitian to identify your daily goals for nutrients listed on the label. Shopping  Avoid buying canned, premade, or processed foods. These  foods tend to be high in fat, sodium, and added sugar.  Shop around the outside edge of the grocery store. This includes fresh fruits and vegetables, bulk grains, fresh meats, and fresh dairy. Cooking  Use low-heat cooking methods, such as baking, instead of high-heat cooking methods like deep frying.  Cook using healthy oils, such as olive, canola, or sunflower oil.  Avoid cooking with butter, cream, or high-fat meats. Meal planning  Eat meals and snacks regularly, preferably at the same times every day. Avoid going long periods of time without eating.  Eat foods high in fiber, such as fresh fruits, vegetables, beans, and whole grains. Talk to your dietitian about how many servings of carbohydrates you can eat at each meal.  Eat 4-6 ounces of lean protein each day, such as lean meat, chicken, fish, eggs, or tofu. 1 ounce is equal to 1 ounce of meat, chicken, or fish, 1 egg, or 1/4 cup of tofu.  Eat some foods each day that contain healthy fats, such as avocado, nuts, seeds, and fish. Lifestyle   Check your blood glucose regularly.  Exercise at least 30 minutes 5 or more days each week, or as told by your health care provider.  Take medicines as told by your health care provider.  Do not use any products that contain nicotine or tobacco, such as cigarettes and e-cigarettes. If you need help quitting, ask your health care provider.  Work with a counselor or diabetes educator to identify strategies to manage stress and any emotional and social challenges. What are some questions to ask my health care provider?  Do I need to meet with a diabetes educator?  Do I need to meet with a dietitian?  What number can I call if I have questions?  When are the best times to check my blood glucose? Where to find more information:  American Diabetes Association: diabetes.org/food-and-fitness/food  Academy of Nutrition and Dietetics:  www.eatright.org/resources/health/diseases-and-conditions/diabetes  National Institute of Diabetes and Digestive and Kidney Diseases (NIH): www.niddk.nih.gov/health-information/diabetes/overview/diet-eating-physical-activity Summary  A healthy meal plan will help you control your blood glucose and maintain a healthy lifestyle.  Working with a diet and nutrition specialist (dietitian) can help you make a meal plan that is best for you.  Keep in mind that carbohydrates and alcohol have immediate effects on your blood glucose levels. It is important to count carbohydrates and to use alcohol carefully. This information is not intended to replace advice given to you by your health care provider. Make sure you discuss any questions you have with your health care provider. Document Released: 05/29/2005 Document Revised: 10/06/2016 Document Reviewed: 10/06/2016 Elsevier Interactive Patient Education  2018 Elsevier Inc.  

## 2017-12-22 ENCOUNTER — Encounter: Payer: Self-pay | Admitting: Internal Medicine

## 2017-12-22 DIAGNOSIS — E119 Type 2 diabetes mellitus without complications: Secondary | ICD-10-CM | POA: Insufficient documentation

## 2017-12-22 DIAGNOSIS — Z794 Long term (current) use of insulin: Secondary | ICD-10-CM | POA: Insufficient documentation

## 2017-12-22 NOTE — Progress Notes (Signed)
Subjective:    Patient ID: Michelle Vang, female    DOB: 09-28-1928, 84 y.o.   MRN: [id number]  HPI  Pt presents to the clinic today to discuss her lab results.  New Onset DM 2: Her A1C was 7.9%. She has never been told that she has diabetes. She denies blurred vision, increased thirst, non healing wounds or numbness and tingling in her hands or feet. She does have some urinary frequency. On a typical day, her diet consists of:  Breakfast: Oatmeal, scrambled eggs and fruit. Lunch: Sandwich, cookies and milk Dinner: Spaghetti or Music therapist from [employer] Snacks: Ice cream  Review of Systems  Past Medical History:  Diagnosis Date  . Bladder cancer ([place])   . GERD (gastroesophageal reflux disease)   . Hypertension   . Lung cancer ([place])   . Thyroid disease     Current Outpatient Medications  Medication Sig Dispense Refill  . Cholecalciferol (VITAMIN D3) 1000 UNITS CAPS Take 2 capsules by mouth daily. Reported on 11/15/2015    . levothyroxine (SYNTHROID, LEVOTHROID) 88 MCG tablet TAKE 1 TABLET BY MOUTH ONCE A DAY 30 tablet 0  . losartan (COZAAR) 50 MG tablet Take 1 tablet (50 mg total) daily by mouth. 90 tablet 1  . memantine (NAMENDA) 10 MG tablet Take 1 tablet twice a day 180 tablet 3  . Omega-3 Fatty Acids (FISH OIL) 1000 MG CAPS Take 1 capsule by mouth daily.    [place] Kitchen omeprazole (PRILOSEC) 20 MG capsule Take 20 mg by mouth as needed.      No current facility-administered medications for this visit.     No Known Allergies  Family History  Problem Relation Age of Onset  . Stroke Mother   . Heart disease Father   . Cancer Sister        Pancreatic    Social History   Socioeconomic History  . Marital status: Widowed    Spouse name: Not on file  . Number of children: 2  . Years of education: Not on file  . Highest education level: Not on file  Occupational History  . Occupation: Part time Scientist, water quality  Social Needs  . Financial resource strain: Not on file  . Food  insecurity:    Worry: Not on file    Inability: Not on file  . Transportation needs:    Medical: Not on file    Non-medical: Not on file  Tobacco Use  . Smoking status: Never Smoker  . Smokeless tobacco: Never Used  Substance and Sexual Activity  . Alcohol use: No    Alcohol/week: 0.0 oz  . Drug use: No  . Sexual activity: Not Currently  Lifestyle  . Physical activity:    Days per week: Not on file    Minutes per session: Not on file  . Stress: Not on file  Relationships  . Social connections:    Talks on phone: Not on file    Gets together: Not on file    Attends religious service: Not on file    Active member of club or organization: Not on file    Attends meetings of clubs or organizations: Not on file    Relationship status: Not on file  . Intimate partner violence:    Fear of current or ex partner: Not on file    Emotionally abused: Not on file    Physically abused: Not on file    Forced sexual activity: Not on file  Other Topics Concern  .  Not on file  Social History Narrative  . Not on file     Constitutional: Denies fever, malaise, fatigue, headache or abrupt weight changes.  HEENT: Denies eye pain, eye redness, ear pain, ringing in the ears, wax buildup, runny nose, nasal congestion, bloody nose, or sore throat. Respiratory: Denies difficulty breathing, shortness of breath, cough or sputum production.   Cardiovascular: Denies chest pain, chest tightness, palpitations or swelling in the hands or feet.  Gastrointestinal: Denies abdominal pain, bloating, constipation, diarrhea or blood in the stool.  GU: Pt reports urinary frequency. Denies urgency, pain with urination, burning sensation, blood in urine, odor or discharge. Skin: Denies redness, rashes, lesions or ulcercations.  Neurological: Denies dizziness, difficulty with memory, difficulty with speech or problems with balance and coordination.    No other specific complaints in a complete review of systems  (except as listed in HPI above).     Objective:   Physical Exam   BP 116/72 (BP Location: Left Arm, Patient Position: Sitting, Cuff Size: Large)   Pulse 80   Temp 98.2 F (36.8 C) (Oral)   Ht 5\' 5"  (1.651 m)   Wt 202 lb (91.6 kg)   SpO2 98%   BMI 33.61 kg/m  Wt Readings from Last 3 Encounters:  12/18/17 202 lb (91.6 kg)  12/08/17 202 lb (91.6 kg)  10/09/17 198 lb (89.8 kg)    General: Appears her stated age, obese, in NAD. Skin: Warm, dry and intact. No ulcerations noted. Cardiovascular: Normal rate and rhythm. S1,S2 noted.  No murmur, rubs or gallops noted. 1+  BLE edema.  Pulmonary/Chest: Normal effort and positive vesicular breath sounds. No respiratory distress. No wheezes, rales or ronchi noted.  Neurological: Alert and oriented. Sensation intact to BLE.    BMET    Component Value Date/Time   NA 138 12/08/2017 1544   NA 140 03/22/2013 0800   K 4.5 12/08/2017 1544   K 4.1 03/22/2013 0800   CL 105 12/08/2017 1544   CL 106 03/22/2013 0800   CO2 25 12/08/2017 1544   CO2 27 03/22/2013 0800   GLUCOSE 147 (H) 12/08/2017 1544   GLUCOSE 104 (H) 03/22/2013 0800   BUN 23 12/08/2017 1544   BUN 15 03/22/2013 0800   CREATININE 0.89 12/08/2017 1544   CREATININE 0.91 (H) 10/24/2016 1502   CALCIUM 9.5 12/08/2017 1544   CALCIUM 9.2 03/22/2013 0800   GFRNONAA 59 (L) 03/22/2013 0800   GFRAA >60 03/22/2013 0800    Lipid Panel     Component Value Date/Time   CHOL 207 (H) 12/08/2017 1544   TRIG 178.0 (H) 12/08/2017 1544   HDL 47.30 12/08/2017 1544   CHOLHDL 4 12/08/2017 1544   VLDL 35.6 12/08/2017 1544   LDLCALC 124 (H) 12/08/2017 1544    CBC    Component Value Date/Time   WBC 8.4 12/08/2017 1544   RBC 4.47 12/08/2017 1544   HGB 14.3 12/08/2017 1544   HGB 13.0 09/22/2012 0934   HCT 42.1 12/08/2017 1544   HCT 39.4 09/22/2012 0934   PLT 277.0 12/08/2017 1544   PLT 253 09/22/2012 0934   MCV 94.2 12/08/2017 1544   MCV 91 09/22/2012 0934   MCH 30.4 10/24/2016 1502    MCHC 33.9 12/08/2017 1544   RDW 12.8 12/08/2017 1544   RDW 13.0 09/22/2012 0934   LYMPHSABS 1.7 09/22/2012 0934   MONOABS 0.7 09/22/2012 0934   EOSABS 0.2 09/22/2012 0934   BASOSABS 0.1 09/22/2012 0934    Hgb A1C Lab Results  Component  Value Date   HGBA1C 7.9 (H) 12/09/2017           Assessment & Plan:

## 2017-12-22 NOTE — Assessment & Plan Note (Signed)
New onset Discussed diabetes and standards of medical care Discussed lifestyle changes vs treatment with medication Did a thorough diet review, discussed cutting out sugar and carbs Disccussed the need for diabetic eye and foot exams On Losartan already for HTN Immunizations are UTD  RTC in 3 months for follow up chronic conditions

## 2017-12-26 ENCOUNTER — Other Ambulatory Visit: Payer: Self-pay | Admitting: Internal Medicine

## 2018-01-14 ENCOUNTER — Other Ambulatory Visit: Payer: Self-pay | Admitting: Internal Medicine

## 2018-01-19 ENCOUNTER — Encounter: Payer: Self-pay | Admitting: Family Medicine

## 2018-01-19 ENCOUNTER — Ambulatory Visit (INDEPENDENT_AMBULATORY_CARE_PROVIDER_SITE_OTHER): Payer: Medicare Other | Admitting: Family Medicine

## 2018-01-19 DIAGNOSIS — L989 Disorder of the skin and subcutaneous tissue, unspecified: Secondary | ICD-10-CM

## 2018-01-19 NOTE — Patient Instructions (Signed)
The spots could be skin cancers or precancerous.  They should blister and then scab over then heal.  If not better, then we can set you up with dermatology.  Update [place] as needed.  Take care.  Glad to see you.

## 2018-01-19 NOTE — Progress Notes (Signed)
Spots on her scalp, not healing.  They itch. Present for months.  Not clearly getting bigger. 3 spots on the stop of the scalp.  No other new skin lesion.  Usually sees the skin clinic year in [place].  No h/o psoriasis, etc. here today with daughter who supplements history.  Meds, vitals, and allergies reviewed.   ROS: Per HPI unless specifically indicated in ROS section   nad ncat except for 3 lesions noted on the top of the scalp.   2 of them are 1x1cm 1 is 1.5x1cm No LA No similar lesions noted on the neck or ears.

## 2018-01-20 DIAGNOSIS — L989 Disorder of the skin and subcutaneous tissue, unspecified: Secondary | ICD-10-CM | POA: Insufficient documentation

## 2018-01-20 NOTE — Assessment & Plan Note (Signed)
Discussed with patient and daughter.  Multiple options for treatment discussed.  She likely has either actinic changes or possibly squamous cell versus basal cell lesions.  All of the lesions look superficial and localized.  We can refer to dermatology, she could have possible excision, or she could have local destruction with liquid nitrogen.  We talked about all options.  It is reasonable to try liquid nitrogen destruction with the discussion that this may not be 100% effective and may require retreatment.  This may be the option that she can tolerate the easiest with the least trouble for healing.  Patient and her daughter opted for that.  Each lesion frozen x3 with liquid nitrogen with adequate thawing between cycles.  No complications.  Routine cautions and instructions given.  See after visit summary.  Update me as needed.

## 2018-01-28 ENCOUNTER — Telehealth: Payer: Self-pay | Admitting: Neurology

## 2018-01-28 NOTE — Telephone Encounter (Signed)
Patient daughter needs to move appt but does not want to wait until Dec. She would not be able to bring her mother at the June appt so she wants to speak to someone about this. She states that she has seen a lot changes and does not feel comfortable in waiting to Dec. She states that if we cant see her before Dec then she might have to get a new provider

## 2018-01-29 NOTE — Telephone Encounter (Signed)
Called patient daughter and they can come in on June 7th at 8:30 [place] in will put patient on the schedule.

## 2018-01-29 NOTE — Telephone Encounter (Signed)
Dr. Delice Lesch has some work in slots.  May 23rd at 10am and 10:30am  June 7th at 8:30am and 9am.  Hinton Dyer, can you see if pt can come any of these times?

## 2018-01-29 NOTE — Telephone Encounter (Signed)
Appointment scheduled.

## 2018-02-16 ENCOUNTER — Ambulatory Visit: Payer: Medicare Other | Admitting: Neurology

## 2018-02-19 ENCOUNTER — Encounter: Payer: Self-pay | Admitting: Neurology

## 2018-02-19 ENCOUNTER — Other Ambulatory Visit: Payer: Self-pay

## 2018-02-19 ENCOUNTER — Ambulatory Visit: Payer: Medicare Other | Admitting: Neurology

## 2018-02-19 VITALS — BP 142/78 | HR 73 | Ht 66.0 in | Wt 191.0 lb

## 2018-02-19 DIAGNOSIS — G3184 Mild cognitive impairment, so stated: Secondary | ICD-10-CM

## 2018-02-19 MED ORDER — MEMANTINE HCL 10 MG PO TABS: ORAL_TABLET | ORAL | 3 refills | Status: DC

## 2018-02-19 NOTE — Patient Instructions (Signed)
1. Continue Namenda 10mg  twice a day 2. Continue to monitor driving. Would avoid driving in poor driving conditions, recommend only daytime driving and driving within a 5 mile radius from home 3. Follow-up in 6 months, call for any changes  HOME SAFETY: Consider the safety of the kitchen when operating appliances like stoves, microwave oven, and blender. Consider having supervision and share cooking responsibilities until no longer able to participate in those. Accidents with firearms and other hazards in the house should be identified and addressed as well.  DRIVING: Regarding driving, in patients with progressive memory problems, driving will be impaired. We advise to have someone else do the driving if trouble finding directions or if minor accidents are reported. Independent driving assessment is available to determine safety of driving.  MEDICATION SUPERVISION: Inability to self-administer medication needs to be constantly addressed. Implement a mechanism to ensure safe administration of the medications.  RECOMMENDATIONS FOR ALL PATIENTS WITH MEMORY PROBLEMS: 1. Continue to exercise (Recommend 30 minutes of walking everyday, or 3 hours every week) 2. Increase social interactions - continue going to [place] and enjoy social gatherings with friends and family 3. Eat healthy, avoid fried foods and eat more fruits and vegetables 4. Maintain adequate blood pressure, blood sugar, and blood cholesterol level. Reducing the risk of stroke and cardiovascular disease also helps promoting better memory. 5. Avoid stressful situations. Live a simple life and avoid aggravations. Organize your time and prepare for the next day in anticipation. 6. Sleep well, avoid any interruptions of sleep and avoid any distractions in the bedroom that may interfere with adequate sleep quality 7. Avoid sugar, avoid sweets as there is a strong link between excessive sugar intake, diabetes, and cognitive impairment The  Mediterranean diet has been shown to help patients reduce the risk of progressive memory disorders and reduces cardiovascular risk. This includes eating fish, eat fruits and green leafy vegetables, nuts like almonds and hazelnuts, walnuts, and also use olive oil. Avoid fast foods and fried foods as much as possible. Avoid sweets and sugar as sugar use has been linked to worsening of memory function.  There is always a concern of gradual progression of memory problems. If this is the case, then we may need to adjust level of care according to patient needs. Support, both to the patient and caregiver, should then be put into place.

## 2018-02-19 NOTE — Progress Notes (Signed)
NEUROLOGY FOLLOW UP OFFICE NOTE  Michelle Vang [id number]  DOB: 1929/09/08  HISTORY OF PRESENT ILLNESS: I had the pleasure of seeing Michelle Vang in follow-up in the neurology clinic on 02/19/2018.  The patient was last seen a year ago for mild cognitive impairment, MMSE in June 2018 was 27/30 (27/30 in November 2017, 25/30 in April 2017). She is again accompanied by her daughter who helps supplement the history today.  She reports doing well, her daughter has not noticed a lot of changes as well. She continues to live alone. Her daughter fills out her pillbox weekly, then both daughters call her daily to remind her to take the medications. She denies getting lost driving, however her daughter reminds her of another incident 2 months ago where she drove to a friend's house in [place] and got turned around, she was gone for 3-4 hours taking a long way to get home. Her daughter denies any hygiene issues. Daughter is in charge of finances. She denies any difficulties with ADLs. No hallucinations or personality changes, however her daughter reports one instance where she was convinced her other daughter was moving, she called her daughter to get her because the other one was moving (when she was not). She says she dreamt that her daughter was moving. No other similar instances. She denies any headaches, diplopia, dysarthria/dysphagia, neck/back pain, focal numbness/tingling/weakness, bladder dysfunction. No tremors or falls.   HPI 4/101/2017: This is [name] with a history of hypertension, hypothyroidism, lung cancer s/p partial lobectomy, bladder cancer, melanoma, who presented for worsening memory. She lives by herself. Her daughter started noticing memory changes around 5-6 months ago, she would ask the same question or say the same information repeatedly. She feels she is losing her memory but does not want to think it is extensive. She has noticed she misplaces things a lot. She  denies any missed bill payments, she occasionally forgets to take her medication but states this is not a regular occurrence. Her daughter took over her checkbook in January because she was very generous giving donations to every cause she got a letter from. She denies leaving the stove on. She continues to drive without getting lost. 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 She is able to keep her hair appointments and pick up her sister without any difficulties. Her daughter denies any personality changes, hygiene is good, no difficulties with ADLs. She was started on Namenda by her PCP 3-4 months ago, increased to 10mg  BID last month because daughter reported no improvement with memory. She denies any side effects to Namenda. She has not tried any other medications such as Aricept. There is frequent constipation. She lost her sense of smell years ago when she had the lung cancer 25 years ago.  She denies any family history of dementia, no history of head injuries or alcohol intake.   PAST MEDICAL HISTORY: Past Medical History:  Diagnosis Date  . Bladder cancer ([place])   . GERD (gastroesophageal reflux disease)   . Hypertension   . Lung cancer ([place])   . Thyroid disease     MEDICATIONS: Current Outpatient Medications on File Prior to Visit  Medication Sig Dispense Refill  . Cholecalciferol (VITAMIN D3) 1000 UNITS CAPS Take 2 capsules by mouth daily. Reported on 11/15/2015    . levothyroxine (SYNTHROID, LEVOTHROID) 88 MCG tablet TAKE 1 TABLET BY MOUTH ONCE DAILY 30 tablet 2  .  losartan (COZAAR) 50 MG tablet TAKE 1 TABLET BY MOUTH ONCE DAILY 90 tablet 2  . memantine (NAMENDA) 10 MG tablet Take 1 tablet twice a day 180 tablet 3  . Omega-3 Fatty Acids (FISH OIL) 1000 MG CAPS Take 1 capsule by mouth daily.     No current facility-administered medications on file prior to visit.     ALLERGIES: No Known Allergies  FAMILY HISTORY: Family History   Problem Relation Age of Onset  . Stroke Mother   . Heart disease Father   . Cancer Sister        Pancreatic    SOCIAL HISTORY: Social History   Socioeconomic History  . Marital status: Widowed    Spouse name: Not on file  . Number of children: 2  . Years of education: Not on file  . Highest education level: Not on file  Occupational History  . Occupation: Part time Scientist, water quality  Social Needs  . Financial resource strain: Not on file  . Food insecurity:    Worry: Not on file    Inability: Not on file  . Transportation needs:    Medical: Not on file    Non-medical: Not on file  Tobacco Use  . Smoking status: Never Smoker  . Smokeless tobacco: Never Used  Substance and Sexual Activity  . Alcohol use: No    Alcohol/week: 0.0 oz  . Drug use: No  . Sexual activity: Not Currently  Lifestyle  . Physical activity:    Days per week: Not on file    Minutes per session: Not on file  . Stress: Not on file  Relationships  . Social connections:    Talks on phone: Not on file    Gets together: Not on file    Attends religious service: Not on file    Active member of club or organization: Not on file    Attends meetings of clubs or organizations: Not on file    Relationship status: Not on file  . Intimate partner violence:    Fear of current or ex partner: Not on file    Emotionally abused: Not on file    Physically abused: Not on file    Forced sexual activity: Not on file  Other Topics Concern  . Not on file  Social History Narrative  . Not on file    REVIEW OF SYSTEMS: Constitutional: No fevers, chills, or sweats, no generalized fatigue, change in appetite Eyes: No visual changes, double vision, eye pain Ear, nose and throat: No hearing loss, ear pain, nasal congestion, sore throat Cardiovascular: No chest pain, palpitations Respiratory:  No shortness of breath at rest or with exertion, wheezes GastrointestinaI: No nausea, vomiting, diarrhea, abdominal pain, fecal  incontinence Genitourinary:  No dysuria, urinary retention or frequency Musculoskeletal:  No neck pain, back pain Integumentary: No rash, pruritus, skin lesions Neurological: as above Psychiatric: No depression, insomnia, anxiety Endocrine: No palpitations, fatigue, diaphoresis, mood swings, change in appetite, change in weight, increased thirst Hematologic/Lymphatic:  No anemia, purpura, petechiae. Allergic/Immunologic: no itchy/runny eyes, nasal congestion, recent allergic reactions, rashes  PHYSICAL EXAM: Vitals:   02/19/18 0820  BP: (!) 142/78  Pulse: 73  SpO2: 97%   General: No acute distress Head:  Normocephalic/atraumatic Neck: supple, no paraspinal tenderness, full range of motion Heart:  Regular rate and rhythm Lungs:  Clear to auscultation bilaterally Back: No paraspinal tenderness Skin/Extremities: No rash, no edema Neurological Exam: alert and oriented to person, place, and year/season. States it is July. No aphasia  or dysarthria. Fund of knowledge is appropriate.  Recent and remote memory intact.  Attention and concentration are normal.    Able to name objects and repeat phrases. CDT 5/5 MMSE - Mini Mental State Exam 02/19/2018 02/16/2017 08/06/2016  Orientation to time 3 2 4   Orientation to Place 5 5 4   Registration 3 3 3   Attention/ Calculation 5 5 5   Recall 3 3 2   Language- name 2 objects 2 2 2   Language- repeat 1 1 1   Language- follow 3 step command 3 3 3   Language- read & follow direction 1 1 1   Write a sentence 1 1 1   Copy design 1 1 1   Total score 28 27 27    Cranial nerves: Pupils equal, round, reactive to light.  Extraocular movements intact with no nystagmus. Visual fields full. Facial sensation intact. No facial asymmetry. Tongue, uvula, palate midline.  Motor: Bulk and tone normal, muscle strength 5/5 throughout with no pronator drift.  Sensation to light touch intact.  No extinction to double simultaneous stimulation.  Deep tendon reflexes 2+ throughout,  toes downgoing.  Finger to nose testing intact.  Gait narrow-based and steady.  Romberg negative.  IMPRESSION: This is [name] with a history of hypertension, hypothyroidism, lung cancer s/p partial lobectomy, bladder cancer, melanoma, with worsening memory. Her neurological exam is non-focal, MMSE today 28/30 (27/30 in June 2018 and November 2017, 25/30 in April 2017). Symptoms suggestive of mild cognitive impairment, possible mild dementia. Symptoms overall stable. Continue Namenda 10mg  BID. Continue to monitor driving, she was advised to restrict driving to a 5 mile radius from home, daytime driving. We again discussed the importance of control of vascular risk factors, physical exercise, and brain stimulation exercises for brain health. She will follow-up in 6 months and knows to call for any changes.   Thank you for allowing me to participate in her care.  Please do not hesitate to call for any questions or concerns.  The duration of this appointment visit was 30 minutes of face-to-face time with the patient.  Greater than 50% of this time was spent in counseling, explanation of diagnosis, planning of further management, and coordination of care.   Ellouise Newer, M.D.   CC: Webb Silversmith, NP

## 2018-02-23 ENCOUNTER — Ambulatory Visit: Payer: Medicare Other | Admitting: Neurology

## 2018-03-20 ENCOUNTER — Other Ambulatory Visit: Payer: Self-pay | Admitting: Internal Medicine

## 2018-03-22 ENCOUNTER — Ambulatory Visit (INDEPENDENT_AMBULATORY_CARE_PROVIDER_SITE_OTHER): Payer: Medicare Other | Admitting: Internal Medicine

## 2018-03-22 ENCOUNTER — Encounter: Payer: Self-pay | Admitting: Internal Medicine

## 2018-03-22 DIAGNOSIS — E119 Type 2 diabetes mellitus without complications: Secondary | ICD-10-CM

## 2018-03-22 LAB — POCT GLYCOSYLATED HEMOGLOBIN (HGB A1C): HEMOGLOBIN A1C: 6.2 % — AB (ref 4.0–5.6)

## 2018-03-22 NOTE — Progress Notes (Signed)
Subjective:    Patient ID: Michelle Vang, female    DOB: [birth year], 84 y.o.   MRN: [id number]  HPI  Pt presents to the clinic today for 3 months follow up of DM 2. At her last visit, she was diagnosed with new onset diabetes, A1C of 7.9%. Given her age, we opted to avoid medication, but advised her to reduce the sugar and carbs in her diet, to see if we could control A1C without medications. She reports she has cut out sweets. She has also lost 11 lbs.  Review of Systems  Past Medical History:  Diagnosis Date  . Bladder cancer ([place])   . GERD (gastroesophageal reflux disease)   . Hypertension   . Lung cancer ([place])   . Thyroid disease     Current Outpatient Medications  Medication Sig Dispense Refill  . Cholecalciferol (VITAMIN D3) 1000 UNITS CAPS Take 2 capsules by mouth daily. Reported on 11/15/2015    . levothyroxine (SYNTHROID, LEVOTHROID) 88 MCG tablet TAKE 1 TABLET BY MOUTH ONCE DAILY 30 tablet 2  . losartan (COZAAR) 50 MG tablet TAKE 1 TABLET BY MOUTH ONCE DAILY 90 tablet 2  . memantine (NAMENDA) 10 MG tablet Take 1 tablet twice a day 180 tablet 3  . Omega-3 Fatty Acids (FISH OIL) 1000 MG CAPS Take 1 capsule by mouth daily.     No current facility-administered medications for this visit.     No Known Allergies  Family History  Problem Relation Age of Onset  . Stroke Mother   . Heart disease Father   . Cancer Sister        Pancreatic    Social History   Socioeconomic History  . Marital status: Widowed    Spouse name: Not on file  . Number of children: 2  . Years of education: Not on file  . Highest education level: Not on file  Occupational History  . Occupation: Part time Scientist, water quality  Social Needs  . Financial resource strain: Not on file  . Food insecurity:    Worry: Not on file    Inability: Not on file  . Transportation needs:    Medical: Not on file    Non-medical: Not on file  Tobacco Use  . Smoking status: Never Smoker  . Smokeless tobacco: Never  Used  Substance and Sexual Activity  . Alcohol use: No    Alcohol/week: 0.0 oz  . Drug use: No  . Sexual activity: Not Currently  Lifestyle  . Physical activity:    Days per week: Not on file    Minutes per session: Not on file  . Stress: Not on file  Relationships  . Social connections:    Talks on phone: Not on file    Gets together: Not on file    Attends religious service: Not on file    Active member of club or organization: Not on file    Attends meetings of clubs or organizations: Not on file    Relationship status: Not on file  . Intimate partner violence:    Fear of current or ex partner: Not on file    Emotionally abused: Not on file    Physically abused: Not on file    Forced sexual activity: Not on file  Other Topics Concern  . Not on file  Social History Narrative  . Not on file     Constitutional: Denies fever, malaise, fatigue, headache or abrupt weight changes.  Gastrointestinal: Denies abdominal pain, bloating, constipation, diarrhea  or blood in the stool.  GU: Denies urgency, frequency, pain with urination, burning sensation, blood in urine, odor or discharge. Skin: Denies redness, rashes, lesions or ulcercations.    No other specific complaints in a complete review of systems (except as listed in HPI above).     Objective:   Physical Exam   BP 132/80   Pulse 91   Temp 98.1 F (36.7 C) (Oral)   Wt 191 lb (86.6 kg)   SpO2 97%   BMI 30.83 kg/m  Wt Readings from Last 3 Encounters:  03/22/18 191 lb (86.6 kg)  02/19/18 191 lb (86.6 kg)  01/19/18 195 lb 8 oz (88.7 kg)    General: Appears her stated age, in NAD. Skin: Warm, dry and intact. No ulcerations noted. Cardiovascular: Normal rate and rhythm. S1,S2 noted.  No murmur, rubs or gallops noted.  Pulmonary/Chest: Normal effort and positive vesicular breath sounds. No respiratory distress. No wheezes, rales or ronchi noted.  Abdomen: Soft and nontender. Normal bowel sounds. No distention or  masses noted.  Neurological: Alert and oriented. Cranial nerves II-XII grossly intact. Sensation intact to BLE.   BMET    Component Value Date/Time   NA 138 12/08/2017 1544   NA 140 03/22/2013 0800   K 4.5 12/08/2017 1544   K 4.1 03/22/2013 0800   CL 105 12/08/2017 1544   CL 106 03/22/2013 0800   CO2 25 12/08/2017 1544   CO2 27 03/22/2013 0800   GLUCOSE 147 (H) 12/08/2017 1544   GLUCOSE 104 (H) 03/22/2013 0800   BUN 23 12/08/2017 1544   BUN 15 03/22/2013 0800   CREATININE 0.89 12/08/2017 1544   CREATININE 0.91 (H) 10/24/2016 1502   CALCIUM 9.5 12/08/2017 1544   CALCIUM 9.2 03/22/2013 0800   GFRNONAA 59 (L) 03/22/2013 0800   GFRAA >60 03/22/2013 0800    Lipid Panel     Component Value Date/Time   CHOL 207 (H) 12/08/2017 1544   TRIG 178.0 (H) 12/08/2017 1544   HDL 47.30 12/08/2017 1544   CHOLHDL 4 12/08/2017 1544   VLDL 35.6 12/08/2017 1544   LDLCALC 124 (H) 12/08/2017 1544    CBC    Component Value Date/Time   WBC 8.4 12/08/2017 1544   RBC 4.47 12/08/2017 1544   HGB 14.3 12/08/2017 1544   HGB 13.0 09/22/2012 0934   HCT 42.1 12/08/2017 1544   HCT 39.4 09/22/2012 0934   PLT 277.0 12/08/2017 1544   PLT 253 09/22/2012 0934   MCV 94.2 12/08/2017 1544   MCV 91 09/22/2012 0934   MCH 30.4 10/24/2016 1502   MCHC 33.9 12/08/2017 1544   RDW 12.8 12/08/2017 1544   RDW 13.0 09/22/2012 0934   LYMPHSABS 1.7 09/22/2012 0934   MONOABS 0.7 09/22/2012 0934   EOSABS 0.2 09/22/2012 0934   BASOSABS 0.1 09/22/2012 0934    Hgb A1C Lab Results  Component Value Date   HGBA1C 6.2 (A) 03/22/2018           Assessment & Plan:

## 2018-03-22 NOTE — Addendum Note (Signed)
Addended by: Lurlean Nanny on: 03/22/2018 03:30 PM   Modules accepted: Orders

## 2018-03-22 NOTE — Patient Instructions (Signed)

## 2018-03-22 NOTE — Assessment & Plan Note (Signed)
POCT 6.2% Congratulated her on her diet and weight loss efforts No need for medications at this time No micoroalbumin secondary to ARB therapy Foot exam today Encouraged yearly eye exam

## 2018-07-05 ENCOUNTER — Other Ambulatory Visit: Payer: Self-pay | Admitting: Internal Medicine

## 2018-07-13 ENCOUNTER — Telehealth: Payer: Self-pay | Admitting: Radiology

## 2018-07-13 NOTE — Telephone Encounter (Signed)
LM on daughters phone, canceled mother's lab appt. Champ Mungo. Per [facility], no labs needed at this time.

## 2018-07-23 ENCOUNTER — Other Ambulatory Visit: Payer: Medicare Other

## 2018-08-31 ENCOUNTER — Other Ambulatory Visit: Payer: Self-pay | Admitting: Internal Medicine

## 2018-09-24 ENCOUNTER — Telehealth: Payer: Self-pay

## 2018-09-24 NOTE — Telephone Encounter (Signed)
Ok for POCT A1C

## 2018-09-24 NOTE — Telephone Encounter (Signed)
Michelle Vang (DPR signed) left v/m requesting a cb. Michelle Vang would like for pt to come by office on 09/27/18 for lab test only; requesting A1 C to be done. Pt last seen and A1C last done on 03/22/18.Please advise.

## 2018-09-27 ENCOUNTER — Ambulatory Visit: Payer: Medicare Other | Admitting: Neurology

## 2018-09-27 ENCOUNTER — Other Ambulatory Visit (INDEPENDENT_AMBULATORY_CARE_PROVIDER_SITE_OTHER): Payer: Medicare Other

## 2018-09-27 ENCOUNTER — Other Ambulatory Visit: Payer: Self-pay

## 2018-09-27 ENCOUNTER — Encounter: Payer: Self-pay | Admitting: Neurology

## 2018-09-27 ENCOUNTER — Other Ambulatory Visit: Payer: Self-pay | Admitting: Internal Medicine

## 2018-09-27 VITALS — BP 136/68 | HR 77 | Ht 66.0 in | Wt 195.0 lb

## 2018-09-27 DIAGNOSIS — G3184 Mild cognitive impairment, so stated: Secondary | ICD-10-CM | POA: Diagnosis not present

## 2018-09-27 DIAGNOSIS — E119 Type 2 diabetes mellitus without complications: Secondary | ICD-10-CM

## 2018-09-27 LAB — POCT GLYCOSYLATED HEMOGLOBIN (HGB A1C): Hemoglobin A1C: 6.6 % — AB (ref 4.0–5.6)

## 2018-09-27 MED ORDER — MEMANTINE HCL 10 MG PO TABS: ORAL_TABLET | ORAL | 3 refills | Status: DC

## 2018-09-27 NOTE — Patient Instructions (Signed)
Great seeing you! Continue Memantine (Namenda) 10mg  twice a day. Continue to monitor driving. Follow-up in 6 months, call for any changes.  FALL PRECAUTIONS: Be cautious when walking. Scan the area for obstacles that may increase the risk of trips and falls. When getting up in the mornings, sit up at the edge of the bed for a few minutes before getting out of bed. Consider elevating the bed at the head end to avoid drop of blood pressure when getting up. Walk always in a well-lit room (use night lights in the walls). Avoid area rugs or power cords from appliances in the middle of the walkways. Use a walker or a cane if necessary and consider physical therapy for balance exercise. Get your eyesight checked regularly.  FINANCIAL OVERSIGHT: Supervision, especially oversight when making financial decisions or transactions is also recommended.  HOME SAFETY: Consider the safety of the kitchen when operating appliances like stoves, microwave oven, and blender. Consider having supervision and share cooking responsibilities until no longer able to participate in those. Accidents with firearms and other hazards in the house should be identified and addressed as well.  DRIVING: Regarding driving, in patients with progressive memory problems, driving will be impaired. We advise to have someone else do the driving if trouble finding directions or if minor accidents are reported. Independent driving assessment is available to determine safety of driving.  ABILITY TO BE LEFT ALONE: If patient is unable to contact 911 operator, consider using LifeLine, or when the need is there, arrange for someone to stay with patients. Smoking is a fire hazard, consider supervision or cessation. Risk of wandering should be assessed by caregiver and if detected at any point, supervision and safe proof recommendations should be instituted.  MEDICATION SUPERVISION: Inability to self-administer medication needs to be constantly addressed.  Implement a mechanism to ensure safe administration of the medications.  RECOMMENDATIONS FOR ALL PATIENTS WITH MEMORY PROBLEMS: 1. Continue to exercise (Recommend 30 minutes of walking everyday, or 3 hours every week) 2. Increase social interactions - continue going to [place] and enjoy social gatherings with friends and family 3. Eat healthy, avoid fried foods and eat more fruits and vegetables 4. Maintain adequate blood pressure, blood sugar, and blood cholesterol level. Reducing the risk of stroke and cardiovascular disease also helps promoting better memory. 5. Avoid stressful situations. Live a simple life and avoid aggravations. Organize your time and prepare for the next day in anticipation. 6. Sleep well, avoid any interruptions of sleep and avoid any distractions in the bedroom that may interfere with adequate sleep quality 7. Avoid sugar, avoid sweets as there is a strong link between excessive sugar intake, diabetes, and cognitive impairment The Mediterranean diet has been shown to help patients reduce the risk of progressive memory disorders and reduces cardiovascular risk. This includes eating fish, eat fruits and green leafy vegetables, nuts like almonds and hazelnuts, walnuts, and also use olive oil. Avoid fast foods and fried foods as much as possible. Avoid sweets and sugar as sugar use has been linked to worsening of memory function.  There is always a concern of gradual progression of memory problems. If this is the case, then we may need to adjust level of care according to patient needs. Support, both to the patient and caregiver, should then be put into place.

## 2018-09-27 NOTE — Progress Notes (Signed)
NEUROLOGY FOLLOW UP OFFICE NOTE  KARLEIGH BUNTE [id number]  DOB: 15-Jul-1929  HISTORY OF PRESENT ILLNESS: I had the pleasure of seeing Michelle Vang in follow-up in the neurology clinic on 09/27/2018.  The patient was last seen 6 months ago for worsening memory. MMSE in June 2019 was 28/30 (27/30 in June 2018 and November 2017, 25/30 in April 2017). She is again accompanied by her daughter who helps supplement the history today. Since her last visit, she and her daughter report that memory has been overall stable. She continues to live alone. Her daughter fills her pillbox weekly and they call her regularly to remind her. When her daughter checks behind her, she is "not 100% but pretty good overall." She states she only takes 1 medication, her daughter reminded her she took 4 this morning. She is on Memantine 10mg  BID without side effects. She continues to drive short distances without getting lost. Her daughter manages finances. No hygiene concerns, she is able to bathe and dress independently, house is well kept. No paranoia or hallucinations, her daughter reports sometimes she says something but when they push her, she says she must have dreamt it. She denies any headaches, dizziness, vision changes, focal numbness/tingling/weakness. She has some right hip pain, no back pain. Sleep is good, no wandering behavior. Daughter reports she also sleeps a lot during the day but feels she is bored.   HPI 4/101/2017: This is a pleasant 85 yo RH woman with a history of hypertension, hypothyroidism, lung cancer s/p partial lobectomy, bladder cancer, melanoma, who presented for worsening memory. She lives by herself. Her daughter started noticing memory changes around 5-6 months ago, she would ask the same question or say the same information repeatedly. She feels she is losing her memory but does not want to think it is extensive. She has noticed she misplaces things a lot. She denies any missed bill payments, she  occasionally forgets to take her medication but states this is not a regular occurrence. Her daughter took over her checkbook in January because she was very generous giving donations to every cause she got a letter from. She denies leaving the stove on. She continues to drive without getting lost. 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 She is able to keep her hair appointments and pick up her sister without any difficulties. Her daughter denies any personality changes, hygiene is good, no difficulties with ADLs. She was started on Namenda by her PCP 3-4 months ago, increased to 10mg  BID last month because daughter reported no improvement with memory. She denies any side effects to Namenda. She has not tried any other medications such as Aricept. There is frequent constipation. She lost her sense of smell years ago when she had the lung cancer 25 years ago.  She denies any family history of dementia, no history of head injuries or alcohol intake.   PAST MEDICAL HISTORY: Past Medical History:  Diagnosis Date  . Bladder cancer ([place])   . GERD (gastroesophageal reflux disease)   . Hypertension   . Lung cancer ([place])   . Thyroid disease     MEDICATIONS: Current Outpatient Medications on File Prior to Visit  Medication Sig Dispense Refill  . Cholecalciferol (VITAMIN D3) 1000 UNITS CAPS Take 2 capsules by mouth daily. Reported on 11/15/2015    . levothyroxine (SYNTHROID, LEVOTHROID) 88 MCG tablet TAKE 1 TABLET BY MOUTH ONCE DAILY 30 tablet 1  .  losartan (COZAAR) 50 MG tablet TAKE 1 TABLET BY MOUTH ONCE A DAY 90 tablet 0  . memantine (NAMENDA) 10 MG tablet Take 1 tablet twice a day 180 tablet 3  . Omega-3 Fatty Acids (FISH OIL) 1000 MG CAPS Take 1 capsule by mouth daily.     No current facility-administered medications on file prior to visit.     ALLERGIES: No Known Allergies  FAMILY HISTORY: Family History  Problem Relation Age of Onset  .  Stroke Mother   . Heart disease Father   . Cancer Sister        Pancreatic    SOCIAL HISTORY: Social History   Socioeconomic History  . Marital status: Widowed    Spouse name: Not on file  . Number of children: 2  . Years of education: Not on file  . Highest education level: Not on file  Occupational History  . Occupation: Part time Scientist, water quality  Social Needs  . Financial resource strain: Not on file  . Food insecurity:    Worry: Not on file    Inability: Not on file  . Transportation needs:    Medical: Not on file    Non-medical: Not on file  Tobacco Use  . Smoking status: Never Smoker  . Smokeless tobacco: Never Used  Substance and Sexual Activity  . Alcohol use: No    Alcohol/week: 0.0 standard drinks  . Drug use: No  . Sexual activity: Not Currently  Lifestyle  . Physical activity:    Days per week: Not on file    Minutes per session: Not on file  . Stress: Not on file  Relationships  . Social connections:    Talks on phone: Not on file    Gets together: Not on file    Attends religious service: Not on file    Active member of club or organization: Not on file    Attends meetings of clubs or organizations: Not on file    Relationship status: Not on file  . Intimate partner violence:    Fear of current or ex partner: Not on file    Emotionally abused: Not on file    Physically abused: Not on file    Forced sexual activity: Not on file  Other Topics Concern  . Not on file  Social History Narrative  . Not on file    REVIEW OF SYSTEMS: Constitutional: No fevers, chills, or sweats, no generalized fatigue, change in appetite Eyes: No visual changes, double vision, eye pain Ear, nose and throat: No hearing loss, ear pain, nasal congestion, sore throat Cardiovascular: No chest pain, palpitations Respiratory:  No shortness of breath at rest or with exertion, wheezes GastrointestinaI: No nausea, vomiting, diarrhea, abdominal pain, fecal incontinence Genitourinary:   No dysuria, urinary retention or frequency Musculoskeletal:  No neck pain, back pain Integumentary: No rash, pruritus, skin lesions Neurological: as above Psychiatric: No depression, insomnia, anxiety Endocrine: No palpitations, fatigue, diaphoresis, mood swings, change in appetite, change in weight, increased thirst Hematologic/Lymphatic:  No anemia, purpura, petechiae. Allergic/Immunologic: no itchy/runny eyes, nasal congestion, recent allergic reactions, rashes  PHYSICAL EXAM: Vitals:   09/27/18 0832  BP: 136/68  Pulse: 77  SpO2: 97%   General: No acute distress Head:  Normocephalic/atraumatic Neck: supple, no paraspinal tenderness, full range of motion Heart:  Regular rate and rhythm Lungs:  Clear to auscultation bilaterally Back: No paraspinal tenderness Skin/Extremities: No rash, no edema Neurological Exam: alert and oriented to person, place, and date/day of weak. States it is  February 2001, Fall. No aphasia or dysarthria. Fund of knowledge is appropriate.  Recent and remote memory intact.  Attention and concentration are normal.    Able to name objects and repeat phrases. CDT 5/5 MMSE - Mini Mental State Exam 09/27/2018 02/19/2018 02/16/2017  Orientation to time 2 3 2   Orientation to Place 5 5 5   Registration 3 3 3   Attention/ Calculation 5 5 5   Recall 3 3 3   Language- name 2 objects 2 2 2   Language- repeat 1 1 1   Language- follow 3 step command 3 3 3   Language- read & follow direction 1 1 1   Write a sentence 1 1 1   Copy design 1 1 1   Total score 27 28 27    Cranial nerves: Pupils equal, round, reactive to light.  Extraocular movements intact with no nystagmus. Visual fields full. Facial sensation intact. No facial asymmetry. Tongue, uvula, palate midline.  Motor: Bulk and tone normal, muscle strength 5/5 throughout with no pronator drift.  Sensation to light touch intact.  No extinction to double simultaneous stimulation. Finger to nose testing intact.  Gait slightly  wide-based, no ataxia.  IMPRESSION: This is a pleasant 85 yo RH woman with a history of hypertension, hypothyroidism, lung cancer s/p partial lobectomy, bladder cancer, melanoma, with worsening memory. Her neurological exam is non-focal, MMSE today 27/30 (28/30 in June 2018, 27/30 in June 2018 and November 2017, 25/30 in April 2017). She does well with MMSE testing, however history suggestive of mild dementia. Continue Namenda 10mg  BID. Continue to monitor driving. Continue close supervision. We again discussed the importance of control of vascular risk factors, physical exercise, and brain stimulation exercises for brain health. She was encouraged to [facility]. She will follow-up in 6 months and knows to call for any changes.   Thank you for allowing me to participate in her care.  Please do not hesitate to call for any questions or concerns.  The duration of this appointment visit was 30 minutes of face-to-face time with the patient.  Greater than 50% of this time was spent in counseling, explanation of diagnosis, planning of further management, and coordination of care.   Ellouise Newer, M.D.   CC: Webb Silversmith, NP

## 2018-09-28 ENCOUNTER — Other Ambulatory Visit: Payer: Self-pay | Admitting: Internal Medicine

## 2018-10-22 ENCOUNTER — Ambulatory Visit: Payer: Medicare Other | Admitting: Internal Medicine

## 2018-10-22 ENCOUNTER — Encounter: Payer: Self-pay | Admitting: Family Medicine

## 2018-10-22 ENCOUNTER — Ambulatory Visit (INDEPENDENT_AMBULATORY_CARE_PROVIDER_SITE_OTHER): Payer: Medicare Other | Admitting: Family Medicine

## 2018-10-22 VITALS — BP 128/64 | HR 91 | Temp 98.7°F | Resp 20 | Ht 66.0 in | Wt 192.6 lb

## 2018-10-22 DIAGNOSIS — J101 Influenza due to other identified influenza virus with other respiratory manifestations: Secondary | ICD-10-CM | POA: Diagnosis not present

## 2018-10-22 DIAGNOSIS — R05 Cough: Secondary | ICD-10-CM

## 2018-10-22 DIAGNOSIS — R059 Cough, unspecified: Secondary | ICD-10-CM

## 2018-10-22 DIAGNOSIS — R0989 Other specified symptoms and signs involving the circulatory and respiratory systems: Secondary | ICD-10-CM | POA: Diagnosis not present

## 2018-10-22 LAB — POC INFLUENZA A&B (BINAX/QUICKVUE)
Influenza A, POC: POSITIVE — AB
Influenza B, POC: NEGATIVE

## 2018-10-22 MED ORDER — DOXYCYCLINE HYCLATE 100 MG PO TABS: 100.0000 mg | ORAL_TABLET | Freq: Two times a day (BID) | ORAL | 0 refills | Status: DC

## 2018-10-22 MED ORDER — BENZONATATE 100 MG PO CAPS: 100.0000 mg | ORAL_CAPSULE | Freq: Three times a day (TID) | ORAL | 0 refills | Status: DC | PRN

## 2018-10-22 MED ORDER — ALBUTEROL SULFATE HFA 108 (90 BASE) MCG/ACT IN AERS: 2.0000 | INHALATION_SPRAY | Freq: Four times a day (QID) | RESPIRATORY_TRACT | 0 refills | Status: DC | PRN

## 2018-10-22 MED ORDER — OSELTAMIVIR PHOSPHATE 75 MG PO CAPS: 75.0000 mg | ORAL_CAPSULE | Freq: Two times a day (BID) | ORAL | 0 refills | Status: DC

## 2018-10-22 MED ORDER — METHYLPREDNISOLONE 4 MG PO TBPK: ORAL_TABLET | ORAL | 0 refills | Status: DC

## 2018-10-22 NOTE — Patient Instructions (Signed)
Rest, drink plenty of fluids, do good handwashing.  You can take Tylenol as needed to reduce any pain.  Monitor closely for any changes in breathing.  If breathing or cough worsens, call on-call doctor or go to emergency room right away for evaluation    Influenza, Adult Influenza is also called "the flu." It is an infection in the lungs, nose, and throat (respiratory tract). It is caused by a virus. The flu causes symptoms that are similar to symptoms of a cold. It also causes a high fever and body aches. The flu spreads easily from person to person (is contagious). Getting a flu shot (influenza vaccination) every year is the best way to prevent the flu. What are the causes? This condition is caused by the influenza virus. You can get the virus by:  Breathing in droplets that are in the air from the cough or sneeze of a person who has the virus.  Touching something that has the virus on it (is contaminated) and then touching your mouth, nose, or eyes. What increases the risk? Certain things may make you more likely to get the flu. These include:  Not washing your hands often.  Having close contact with many people during cold and flu season.  Touching your mouth, eyes, or nose without first washing your hands.  Not getting a flu shot every year. You may have a higher risk for the flu, along with serious problems such as a lung infection (pneumonia), if you:  Are older than 65.  Are pregnant.  Have a weakened disease-fighting system (immune system) because of a disease or taking certain medicines.  Have a long-term (chronic) illness, such as: ? Heart, kidney, or lung disease. ? Diabetes. ? Asthma.  Have a liver disorder.  Are very overweight (morbidly obese).  Have anemia. This is a condition that affects your red blood cells. What are the signs or symptoms? Symptoms usually begin suddenly and last 4-14 days. They may include:  Fever and chills.  Headaches, body aches,  or muscle aches.  Sore throat.  Cough.  Runny or stuffy (congested) nose.  Chest discomfort.  Not wanting to eat as much as normal (poor appetite).  Weakness or feeling tired (fatigue).  Dizziness.  Feeling sick to your stomach (nauseous) or throwing up (vomiting). How is this treated? If the flu is found early, you can be treated with medicine that can help reduce how bad the illness is and how long it lasts (antiviral medicine). This may be given by mouth (orally) or through an IV tube. Taking care of yourself at home can help your symptoms get better. Your doctor may suggest:  Taking over-the-counter medicines.  Drinking plenty of fluids. The flu often goes away on its own. If you have very bad symptoms or other problems, you may be treated in a hospital. Follow these instructions at home:     Activity  Rest as needed. Get plenty of sleep.  Stay home from work or school as told by your doctor. ? Do not leave home until you do not have a fever for 24 hours without taking medicine. ? Leave home only to visit your doctor. Eating and drinking  Take an ORS (oral rehydration solution). This is a drink that is sold at pharmacies and stores.  Drink enough fluid to keep your pee (urine) pale yellow.  Drink clear fluids in small amounts as you are able. Clear fluids include: ? Water. ? Ice chips. ? Fruit juice that has water  added (diluted fruit juice). ? Low-calorie sports drinks.  Eat bland, easy-to-digest foods in small amounts as you are able. These foods include: ? Bananas. ? Applesauce. ? Rice. ? Lean meats. ? Toast. ? Crackers.  Do not eat or drink: ? Fluids that have a lot of sugar or caffeine. ? Alcohol. ? Spicy or fatty foods. General instructions  Take over-the-counter and prescription medicines only as told by your doctor.  Use a cool mist humidifier to add moisture to the air in your home. This can make it easier for you to breathe.  Cover your  mouth and nose when you cough or sneeze.  Wash your hands with soap and water often, especially after you cough or sneeze. If you cannot use soap and water, use alcohol-based hand sanitizer.  Keep all follow-up visits as told by your doctor. This is important. How is this prevented?   Get a flu shot every year. You may get the flu shot in late summer, fall, or winter. Ask your doctor when you should get your flu shot.  Avoid contact with people who are sick during fall and winter (cold and flu season). Contact a doctor if:  You get new symptoms.  You have: ? Chest pain. ? Watery poop (diarrhea). ? A fever.  Your cough gets worse.  You start to have more mucus.  You feel sick to your stomach.  You throw up. Get help right away if you:  Have shortness of breath.  Have trouble breathing.  Have skin or nails that turn a bluish color.  Have very bad pain or stiffness in your neck.  Get a sudden headache.  Get sudden pain in your face or ear.  Cannot eat or drink without throwing up. Summary  Influenza ("the flu") is an infection in the lungs, nose, and throat. It is caused by a virus.  Take over-the-counter and prescription medicines only as told by your doctor.  Getting a flu shot every year is the best way to avoid getting the flu. This information is not intended to replace advice given to you by your health care provider. Make sure you discuss any questions you have with your health care provider. Document Released: 06/10/2008 Document Revised: 02/17/2018 Document Reviewed: 02/17/2018 Elsevier Interactive Patient Education  2019 [employer].

## 2018-10-22 NOTE — Progress Notes (Signed)
Subjective:    Patient ID: Michelle Vang, female    DOB: 11-08-1928, 85 y.o.   MRN: [id number]  HPI   Patient presents to clinic due to cough and congestion for 2 days.  Patient is unsure if she has had any sick contacts, but usually does attend church every week.  Denies fever or chills, denies nausea/vomiting or diarrhea.  Denies chest pain.  Cough is not productive, but feels dry and harsh.  Patient Active Problem List   Diagnosis Date Noted  . Skin lesion 01/20/2018  . Type 2 diabetes mellitus without complication, without long-term current use of insulin ([place]) 12/22/2017  . History of bladder cancer 12/24/2015  . Thyroid activity decreased 10/02/2015  . MCI (mild cognitive impairment) 10/02/2015  . GERD 01/11/2009  . Essential hypertension 12/12/2008  . NEOPLASM, MALIGNANT, LUNG, HX OF 12/12/2008   Social History   Tobacco Use  . Smoking status: Never Smoker  . Smokeless tobacco: Never Used  Substance Use Topics  . Alcohol use: No    Alcohol/week: 0.0 standard drinks   Review of Systems  Constitutional: Negative for chills, fatigue and fever.  HENT: Negative for congestion, ear pain, sinus pain and sore throat.   Eyes: Negative.   Respiratory: +cough, chest congestion, shortness of breath and wheezing.   Cardiovascular: Negative for chest pain, palpitations and leg swelling.  Gastrointestinal: Negative for abdominal pain, diarrhea, nausea and vomiting.  Genitourinary: Negative for dysuria, frequency and urgency.  Musculoskeletal: Negative for arthralgias and myalgias.  Skin: Negative for color change, pallor and rash.  Neurological: Negative for syncope, light-headedness and headaches.  Psychiatric/Behavioral: The patient is not nervous/anxious.       Objective:   Physical Exam Vitals signs and nursing note reviewed.  Constitutional:      General: She is not in acute distress.    Appearance: She is not toxic-appearing.  HENT:     Head: Normocephalic.   Right Ear: Tympanic membrane, ear canal and external ear normal.     Left Ear: Tympanic membrane, ear canal and external ear normal.     Nose: Nose normal.     Mouth/Throat:     Mouth: Mucous membranes are moist.     Pharynx: No oropharyngeal exudate or posterior oropharyngeal erythema.  Eyes:     General: No scleral icterus.    Extraocular Movements: Extraocular movements intact.     Conjunctiva/sclera: Conjunctivae normal.  Neck:     Musculoskeletal: Neck supple. No neck rigidity.  Cardiovascular:     Rate and Rhythm: Normal rate and regular rhythm.  Pulmonary:     Effort: Pulmonary effort is normal. No respiratory distress.     Breath sounds: Wheezing and rhonchi present. No rales.  Lymphadenopathy:     Cervical: No cervical adenopathy.  Skin:    General: Skin is warm and dry.     Coloration: Skin is not jaundiced or pale.  Neurological:     Mental Status: She is alert and oriented to person, place, and time.  Psychiatric:        Mood and Affect: Mood normal.        Behavior: Behavior normal.    Vitals:   10/22/18 1520  BP: 128/64  Pulse: 91  Resp: 20  Temp: 98.7 F (37.1 C)  SpO2: 94%      Assessment & Plan:   Influenza A, cough, chest congestion - patient's point-of-care flu swab is positive for influenza A in clinic.  She will begin Tamiflu for treatment.  Advised to get plenty of rest, do good handwashing and increase fluid intake.  Due to patient's age, chest congestion and lung sounds on exam we will cover her with doxycycline to prevent respiratory infection.  She will also use steroid taper, inhaler to help reduce shortness of breath/wheezing symptoms.  We will have patient follow-up in 1 week to make sure she is improving.  Gave patient and her family member strict return precautions, if breathing worsens they are to call the on-call or go to emergency room right away for evaluation.

## 2018-10-28 ENCOUNTER — Encounter: Payer: Self-pay | Admitting: Internal Medicine

## 2018-10-28 ENCOUNTER — Ambulatory Visit (INDEPENDENT_AMBULATORY_CARE_PROVIDER_SITE_OTHER): Payer: Medicare Other | Admitting: Internal Medicine

## 2018-10-28 VITALS — BP 122/66 | HR 72 | Temp 97.9°F | Wt 184.0 lb

## 2018-10-28 DIAGNOSIS — J111 Influenza due to unidentified influenza virus with other respiratory manifestations: Secondary | ICD-10-CM | POA: Diagnosis not present

## 2018-11-02 ENCOUNTER — Encounter: Payer: Self-pay | Admitting: Internal Medicine

## 2018-11-02 NOTE — Progress Notes (Signed)
HPI  Pt presents to the clinic today with c/o presents to the clinic today for follow-up.  She reports was seen 2/7 and diagnosed with the flu.  She reports she was started on Doxycycline, steroids, Tamiflu and an inhaler.  She reports she had finished the steroids and the Tamiflu but continues to take the Doxycycline as prescribed.  She reports she is feeling some better, but is still experiencing some dizziness and weakness.  She denies fevers or body aches.  She does have a poor appetite, has lost 9 pounds in the last 2 weeks.  Denies syncopal episodes  Review of Systems      Past Medical History:  Diagnosis Date  . Bladder cancer ([place])   . GERD (gastroesophageal reflux disease)   . Hypertension   . Lung cancer ([place])   . Thyroid disease     Family History  Problem Relation Age of Onset  . Stroke Mother   . Heart disease Father   . Cancer Sister        Pancreatic    Social History   Socioeconomic History  . Marital status: Widowed    Spouse name: Not on file  . Number of children: 2  . Years of education: Not on file  . Highest education level: Not on file  Occupational History  . Occupation: Part time Scientist, water quality  Social Needs  . Financial resource strain: Not on file  . Food insecurity:    Worry: Not on file    Inability: Not on file  . Transportation needs:    Medical: Not on file    Non-medical: Not on file  Tobacco Use  . Smoking status: Never Smoker  . Smokeless tobacco: Never Used  Substance and Sexual Activity  . Alcohol use: No    Alcohol/week: 0.0 standard drinks  . Drug use: No  . Sexual activity: Not Currently  Lifestyle  . Physical activity:    Days per week: Not on file    Minutes per session: Not on file  . Stress: Not on file  Relationships  . Social connections:    Talks on phone: Not on file    Gets together: Not on file    Attends religious service: Not on file    Active member of club or organization: Not on file    Attends meetings of  clubs or organizations: Not on file    Relationship status: Not on file  . Intimate partner violence:    Fear of current or ex partner: Not on file    Emotionally abused: Not on file    Physically abused: Not on file    Forced sexual activity: Not on file  Other Topics Concern  . Not on file  Social History Narrative  . Not on file    No Known Allergies   Constitutional:  Denies headache, fatigue, fever or abrupt weight changes.  HEENT:  Positive nasal congestion, sore throat. Denies eye redness, eye pain, pressure behind the eyes, facial pain, nasal congestion, ear pain, ringing in the ears, wax buildup, runny nose or bloody nose. Respiratory: Positive cough. Denies difficulty breathing or shortness of breath.  Cardiovascular: Denies chest pain, chest tightness, palpitations or swelling in the hands or feet.  Neurovascular: She reports dizziness and weakness.  No other specific complaints in a complete review of systems (except as listed in HPI above).  Objective:   BP 122/66   Pulse 72   Temp 97.9 F (36.6 C) (Oral)   Wt  184 lb (83.5 kg)   SpO2 98%   BMI 29.70 kg/m  Wt Readings from Last 3 Encounters:  10/28/18 184 lb (83.5 kg)  10/22/18 192 lb 9.6 oz (87.4 kg)  09/27/18 195 lb (88.5 kg)     General: Appears her stated age, in NAD. HEENT: Head: normal shape and size;  Throat/Mouth: Teeth present, mucosa erythematous and moist, no exudate noted, no lesions or ulcerations noted.  Neck: No cervical lymphadenopathy.  Cardiovascular: Normal rate and rhythm.   Pulmonary/Chest: Normal effort and positive vesicular breath sounds. No respiratory distress. No wheezes, rales or ronchi noted.       Assessment & Plan:   Influenza::  Get some rest and drink plenty of water Clinically appears to be getting better Continue Doxy and Albuterol as prescribed No indication for additional steroids Likely mildly dehydrated, advised her to push fluids, consume boost if appetite has  not returned to normal  RTC as needed or if symptoms persist.   Webb Silversmith, NP

## 2018-11-02 NOTE — Patient Instructions (Signed)

## 2018-12-06 ENCOUNTER — Other Ambulatory Visit: Payer: Self-pay | Admitting: Internal Medicine

## 2018-12-13 ENCOUNTER — Encounter: Payer: Medicare Other | Admitting: Internal Medicine

## 2019-01-05 ENCOUNTER — Other Ambulatory Visit: Payer: Self-pay | Admitting: Internal Medicine

## 2019-02-02 ENCOUNTER — Encounter: Payer: Medicare Other | Admitting: Internal Medicine

## 2019-02-02 ENCOUNTER — Other Ambulatory Visit: Payer: Self-pay | Admitting: Internal Medicine

## 2019-03-07 ENCOUNTER — Other Ambulatory Visit: Payer: Self-pay | Admitting: Internal Medicine

## 2019-03-28 ENCOUNTER — Ambulatory Visit (INDEPENDENT_AMBULATORY_CARE_PROVIDER_SITE_OTHER): Payer: Medicare Other | Admitting: Internal Medicine

## 2019-03-28 ENCOUNTER — Encounter: Payer: Self-pay | Admitting: Internal Medicine

## 2019-03-28 ENCOUNTER — Other Ambulatory Visit: Payer: Self-pay

## 2019-03-28 VITALS — BP 122/62 | HR 77 | Temp 97.9°F | Ht 66.0 in | Wt 179.0 lb

## 2019-03-28 DIAGNOSIS — Z0000 Encounter for general adult medical examination without abnormal findings: Secondary | ICD-10-CM

## 2019-03-28 DIAGNOSIS — I10 Essential (primary) hypertension: Secondary | ICD-10-CM

## 2019-03-28 DIAGNOSIS — E039 Hypothyroidism, unspecified: Secondary | ICD-10-CM | POA: Diagnosis not present

## 2019-03-28 DIAGNOSIS — K219 Gastro-esophageal reflux disease without esophagitis: Secondary | ICD-10-CM | POA: Diagnosis not present

## 2019-03-28 DIAGNOSIS — E119 Type 2 diabetes mellitus without complications: Secondary | ICD-10-CM

## 2019-03-28 DIAGNOSIS — G3184 Mild cognitive impairment, so stated: Secondary | ICD-10-CM

## 2019-03-28 NOTE — Assessment & Plan Note (Signed)
Daughter refusing A1C today- states "I know it's not good and at this age, she should eat what she wants" Discussed possibility of long term complications including death if uncontrolled Encouraged her to consume a low carb diet No micro albumin secondary to ARB therapy Foot exam today Encouraged yearly eye exams Flu and pneumonia vaccines UTD

## 2019-03-28 NOTE — Assessment & Plan Note (Addendum)
Currently takes Namenda.   Consider adding Aricept, if patient/daughter decide to proceed.

## 2019-03-28 NOTE — Assessment & Plan Note (Addendum)
Managed on current dose of Synthroid. Check TSH and Free T4 today.

## 2019-03-28 NOTE — Assessment & Plan Note (Signed)
Currently not an issue 

## 2019-03-28 NOTE — Progress Notes (Signed)
HPI:  Patient presents to the clinic for her subsequent Annual Medicare Wellness Examination.  She is also due for a follow up on chronic conditions.  GERD:  Currently not an issue.  She is not taking any medications for this.  HTN:  Her BP today is 122/72.  She is taking Losartan as prescribed.  ECG from 06/2010 eviewed.  Hypothyroidism:  She denies any issues with her current dose of Synthroid.  She is due for repeat labs today.  MCI:  Trouble with recall and short-term memory.  She is taking Namenda as prescribed.  Her daughter has noticed some improvement in her memory since she started taking this.  DM 2: Her last A1C was 6.6%, 09/2018. She is not taking any oral diabetic medication at this time. She does not check her sugars. She checks her feet routinely. She sees an eye doctor annually.   Past Medical History:  Diagnosis Date  . Bladder cancer ([place])   . GERD (gastroesophageal reflux disease)   . Hypertension   . Lung cancer ([place])   . Thyroid disease     Current Outpatient Medications  Medication Sig Dispense Refill  . albuterol (PROVENTIL HFA;VENTOLIN HFA) 108 (90 Base) MCG/ACT inhaler Inhale 2 puffs into the lungs every 6 (six) hours as needed for wheezing or shortness of breath. 1 Inhaler 0  . benzonatate (TESSALON) 100 MG capsule Take 1 capsule (100 mg total) by mouth 3 (three) times daily as needed for cough. 30 capsule 0  . Cholecalciferol (VITAMIN D3) 1000 UNITS CAPS Take 2 capsules by mouth daily. Reported on 11/15/2015    . doxycycline (VIBRA-TABS) 100 MG tablet Take 1 tablet (100 mg total) by mouth 2 (two) times daily. 20 tablet 0  . levothyroxine (SYNTHROID) 88 MCG tablet TAKE 1 TABLET BY MOUTH ONCE DAILY 30 tablet 0  . losartan (COZAAR) 50 MG tablet TAKE 1 TABLET BY MOUTH ONCE A DAY 90 tablet 0  . memantine (NAMENDA) 10 MG tablet Take 1 tablet twice a day 180 tablet 3  . Omega-3 Fatty Acids (FISH OIL) 1000 MG CAPS Take 1 capsule by mouth daily.     No current  facility-administered medications for this visit.     No Known Allergies  Family History  Problem Relation Age of Onset  . Stroke Mother   . Heart disease Father   . Cancer Sister        Pancreatic    Social History   Socioeconomic History  . Marital status: Widowed    Spouse name: Not on file  . Number of children: 2  . Years of education: Not on file  . Highest education level: Not on file  Occupational History  . Occupation: Part time Scientist, water quality  Social Needs  . Financial resource strain: Not on file  . Food insecurity    Worry: Not on file    Inability: Not on file  . Transportation needs    Medical: Not on file    Non-medical: Not on file  Tobacco Use  . Smoking status: Never Smoker  . Smokeless tobacco: Never Used  Substance and Sexual Activity  . Alcohol use: No    Alcohol/week: 0.0 standard drinks  . Drug use: No  . Sexual activity: Not Currently  Lifestyle  . Physical activity    Days per week: Not on file    Minutes per session: Not on file  . Stress: Not on file  Relationships  . Social Herbalist on phone:  Not on file    Gets together: Not on file    Attends religious service: Not on file    Active member of club or organization: Not on file    Attends meetings of clubs or organizations: Not on file    Relationship status: Not on file  . Intimate partner violence    Fear of current or ex partner: Not on file    Emotionally abused: Not on file    Physically abused: Not on file    Forced sexual activity: Not on file  Other Topics Concern  . Not on file  Social History Narrative  . Not on file    Hospitilizations:  None  Health Maintenance:    Flu:  05/2018  Tetanus: >10 years.  Pneumovax: 11/2017  Prevnar: 10/2016  Zostavax: unsure  Mammogram: no longer screening  Pap Smear: no longer screening  Bone Density: no longer screening  Colon Screening: no longer screening  Eye Doctor: annually  Dental Exam: as  needed   Providers:   PCP: Webb Silversmith, NP     I have personally reviewed and have noted:  1. The patient's medical and social history 2. Their use of alcohol, tobacco or illicit drugs 3. Their current medications and supplements 4. The patient's functional ability including ADL's, fall risks, home safety risks and hearing or visual impairment. 5. Diet and physical activities 6. Evidence for depression or mood disorder  Subjective:   Review of Systems:   Constitutional: Denies fever, malaise, fatigue, headache or abrupt weight changes.  HEENT: Denies eye pain, eye redness, ear pain, ringing in the ears, wax buildup, runny nose, nasal congestion, bloody nose, or sore throat. Respiratory: Denies difficulty breathing, shortness of breath, cough or sputum production.   Cardiovascular: Denies chest pain, chest tightness, palpitations or swelling in the hands or feet.  Gastrointestinal: Denies abdominal pain, bloating, constipation, diarrhea or blood in the stool.  GU: Denies urgency, frequency, pain with urination, burning sensation, blood in urine, odor or discharge. Musculoskeletal: Denies decrease in range of motion, difficulty with gait, muscle pain or joint pain and swelling.  Skin: Denies redness, rashes, lesions or ulcercations.  Neurological: Pt reports difficulty with memory. Denies dizziness, difficulty with speech or problems with balance and coordination.  Psych: Denies anxiety, depression, SI/HI.  No other specific complaints in a complete review of systems (except as listed in HPI above).  Objective:  PE:   BP 122/62 (BP Location: Right Arm, Patient Position: Sitting, Cuff Size: Normal)   Pulse 77   Temp 97.9 F (36.6 C) (Temporal)   Ht 5\' 6"  (1.676 m)   Wt 179 lb (81.2 kg)   SpO2 98%   BMI 28.89 kg/m   Wt Readings from Last 3 Encounters:  10/28/18 184 lb (83.5 kg)  10/22/18 192 lb 9.6 oz (87.4 kg)  09/27/18 195 lb (88.5 kg)    General: Appears  her  stated age, well developed, well nourished in NAD. Skin: Warm, dry and intact. No rashes noted. Skin tear noted to left forearm.  HEENT: Head: normal shape and size; Eyes: sclera white, no icterus, conjunctiva pink, PERRLA and EOMs intact; Ears: Tm's gray and intact, normal light reflex, HOH.  Neck: Neck supple, trachea midline. No masses, lumps present.  Cardiovascular: Normal rate and rhythm. S1,S2 noted.  No murmur, rubs or gallops noted. No JVD or BLE edema. No carotid bruits noted. Pulmonary/Chest: Normal effort and positive vesicular breath sounds. No respiratory distress. No wheezes, rales or ronchi noted.  Abdomen: Soft and nontender. Normal bowel sounds. No distention or masses noted. Liver, spleen and kidneys non palpable. Musculoskeletal:  Strength 5/5 BUE/BLE. No signs of joint swelling.  Neurological: Alert and oriented. Cranial nerves II-XII grossly intact. Coordination normal.  Psychiatric: Mood and affect normal. Behavior is normal. Judgment and thought content normal.     BMET    Component Value Date/Time   NA 138 12/08/2017 1544   NA 140 03/22/2013 0800   K 4.5 12/08/2017 1544   K 4.1 03/22/2013 0800   CL 105 12/08/2017 1544   CL 106 03/22/2013 0800   CO2 25 12/08/2017 1544   CO2 27 03/22/2013 0800   GLUCOSE 147 (H) 12/08/2017 1544   GLUCOSE 104 (H) 03/22/2013 0800   BUN 23 12/08/2017 1544   BUN 15 03/22/2013 0800   CREATININE 0.89 12/08/2017 1544   CREATININE 0.91 (H) 10/24/2016 1502   CALCIUM 9.5 12/08/2017 1544   CALCIUM 9.2 03/22/2013 0800   GFRNONAA 59 (L) 03/22/2013 0800   GFRAA >60 03/22/2013 0800    Lipid Panel     Component Value Date/Time   CHOL 207 (H) 12/08/2017 1544   TRIG 178.0 (H) 12/08/2017 1544   HDL 47.30 12/08/2017 1544   CHOLHDL 4 12/08/2017 1544   VLDL 35.6 12/08/2017 1544   LDLCALC 124 (H) 12/08/2017 1544    CBC    Component Value Date/Time   WBC 8.4 12/08/2017 1544   RBC 4.47 12/08/2017 1544   HGB 14.3 12/08/2017 1544   HGB  13.0 09/22/2012 0934   HCT 42.1 12/08/2017 1544   HCT 39.4 09/22/2012 0934   PLT 277.0 12/08/2017 1544   PLT 253 09/22/2012 0934   MCV 94.2 12/08/2017 1544   MCV 91 09/22/2012 0934   MCH 30.4 10/24/2016 1502   MCHC 33.9 12/08/2017 1544   RDW 12.8 12/08/2017 1544   RDW 13.0 09/22/2012 0934   LYMPHSABS 1.7 09/22/2012 0934   MONOABS 0.7 09/22/2012 0934   EOSABS 0.2 09/22/2012 0934   BASOSABS 0.1 09/22/2012 0934    Hgb A1C Lab Results  Component Value Date   HGBA1C 6.6 (A) 09/27/2018      Assessment and Plan:   Medicare Annual Wellness Visit:  Diet: She eats a variety of foods.  Uses meals on wheels. Physical activity: Sedentary Depression/mood screen: Negative Hearing: Intact to whispered voice Visual acuity: Grossly normal, performs annual eye exam  ADLs: Capable Fall risk: None Home safety: Good Cognitive evaluation: Intact to orientation, naming, and repetition. Trouble with recall EOL planning: Adv directives, full code/ I agree  Preventative Medicine: Encouraged her to get a flu shot in the fall. Advised her if she gets cut or bitten by an animal, she will need a tetanus booster. Pneumovax and prevnar UTD. She declines shingrix. She no longer needs pap smears, mammogram, colon screening or bone density. Encouraged her to consume a balanced diet and exercise regimen. Advised her to see an eye doctor and dentist annually. Will check CBC, CMET, Lipid, TSH and Free T4 today.   Next appointment: 1 year, Medicare Wellness Exam  Webb Silversmith, NP

## 2019-03-28 NOTE — Assessment & Plan Note (Addendum)
Currently maintained on Losartan.   CMET today Reinforced DASH diet

## 2019-03-28 NOTE — Patient Instructions (Signed)
Health Maintenance After Age 83 After age 83, you are at a higher risk for certain long-term diseases and infections as well as injuries from falls. Falls are a major cause of broken bones and head injuries in people who are older than age 83. Getting regular preventive care can help to keep you healthy and well. Preventive care includes getting regular testing and making lifestyle changes as recommended by your health care provider. Talk with your health care provider about:  Which screenings and tests you should have. A screening is a test that checks for a disease when you have no symptoms.  A diet and exercise plan that is right for you. What should I know about screenings and tests to prevent falls? Screening and testing are the best ways to find a health problem early. Early diagnosis and treatment give you the best chance of managing medical conditions that are common after age 83. Certain conditions and lifestyle choices may make you more likely to have a fall. Your health care provider may recommend:  Regular vision checks. Poor vision and conditions such as cataracts can make you more likely to have a fall. If you wear glasses, make sure to get your prescription updated if your vision changes.  Medicine review. Work with your health care provider to regularly review all of the medicines you are taking, including over-the-counter medicines. Ask your health care provider about any side effects that may make you more likely to have a fall. Tell your health care provider if any medicines that you take make you feel dizzy or sleepy.  Osteoporosis screening. Osteoporosis is a condition that causes the bones to get weaker. This can make the bones weak and cause them to break more easily.  Blood pressure screening. Blood pressure changes and medicines to control blood pressure can make you feel dizzy.  Strength and balance checks. Your health care provider may recommend certain tests to check your  strength and balance while standing, walking, or changing positions.  Foot health exam. Foot pain and numbness, as well as not wearing proper footwear, can make you more likely to have a fall.  Depression screening. You may be more likely to have a fall if you have a fear of falling, feel emotionally low, or feel unable to do activities that you used to do.  Alcohol use screening. Using too much alcohol can affect your balance and may make you more likely to have a fall. What actions can I take to lower my risk of falls? General instructions  Talk with your health care provider about your risks for falling. Tell your health care provider if: ? You fall. Be sure to tell your health care provider about all falls, even ones that seem minor. ? You feel dizzy, sleepy, or off-balance.  Take over-the-counter and prescription medicines only as told by your health care provider. These include any supplements.  Eat a healthy diet and maintain a healthy weight. A healthy diet includes low-fat dairy products, low-fat (lean) meats, and fiber from whole grains, beans, and lots of fruits and vegetables. Home safety  Remove any tripping hazards, such as rugs, cords, and clutter.  Install safety equipment such as grab bars in bathrooms and safety rails on stairs.  Keep rooms and walkways well-lit. Activity   Follow a regular exercise program to stay fit. This will help you maintain your balance. Ask your health care provider what types of exercise are appropriate for you.  If you need a cane or   walker, use it as recommended by your health care provider.  Wear supportive shoes that have nonskid soles. Lifestyle  Do not drink alcohol if your health care provider tells you not to drink.  If you drink alcohol, limit how much you have: ? 0-1 drink a day for women. ? 0-2 drinks a day for men.  Be aware of how much alcohol is in your drink. In the [place] one drink equals one typical bottle of beer (12  oz), one-half glass of wine (5 oz), or one shot of hard liquor (1 oz).  Do not use any products that contain nicotine or tobacco, such as cigarettes and e-cigarettes. If you need help quitting, ask your health care provider. Summary  Having a healthy lifestyle and getting preventive care can help to protect your health and wellness after age 83.  Screening and testing are the best way to find a health problem early and help you avoid having a fall. Early diagnosis and treatment give you the best chance for managing medical conditions that are more common for people who are older than age 83.  Falls are a major cause of broken bones and head injuries in people who are older than age 83. Take precautions to prevent a fall at home.  Work with your health care provider to learn what changes you can make to improve your health and wellness and to prevent falls. This information is not intended to replace advice given to you by your health care provider. Make sure you discuss any questions you have with your health care provider. Document Released: 07/15/2017 Document Revised: 12/23/2018 Document Reviewed: 07/15/2017 Elsevier Patient Education  2020 [employer]  

## 2019-03-29 LAB — COMPREHENSIVE METABOLIC PANEL
ALT: 31 U/L (ref 0–35)
AST: 36 U/L (ref 0–37)
Albumin: 3.9 g/dL (ref 3.5–5.2)
Alkaline Phosphatase: 100 U/L (ref 39–117)
BUN: 23 mg/dL (ref 6–23)
CO2: 26 mEq/L (ref 19–32)
Calcium: 9.6 mg/dL (ref 8.4–10.5)
Chloride: 104 mEq/L (ref 96–112)
Creatinine, Ser: 0.91 mg/dL (ref 0.40–1.20)
GFR: 58.29 mL/min — ABNORMAL LOW (ref >60.00)
Glucose, Bld: 110 mg/dL — ABNORMAL HIGH (ref 70–99)
Potassium: 4.2 mEq/L (ref 3.5–5.1)
Sodium: 139 mEq/L (ref 135–145)
Total Bilirubin: 0.6 mg/dL (ref 0.2–1.2)
Total Protein: 7.2 g/dL (ref 6.0–8.3)

## 2019-03-29 LAB — LIPID PANEL
Cholesterol: 194 mg/dL (ref 0–200)
HDL: 43.6 mg/dL (ref >39.00)
NonHDL: 150.82
Total CHOL/HDL Ratio: 4
Triglycerides: 216 mg/dL — ABNORMAL HIGH (ref 0.0–149.0)
VLDL: 43.2 mg/dL — ABNORMAL HIGH (ref 0.0–40.0)

## 2019-03-29 LAB — CBC
HCT: 40.6 % (ref 36.0–46.0)
Hemoglobin: 13.6 g/dL (ref 12.0–15.0)
MCHC: 33.6 g/dL (ref 30.0–36.0)
MCV: 94.7 fl (ref 78.0–100.0)
Platelets: 249 10*3/uL (ref 150.0–400.0)
RBC: 4.28 Mil/uL (ref 3.87–5.11)
RDW: 12.5 % (ref 11.5–15.5)
WBC: 8.3 10*3/uL (ref 4.0–10.5)

## 2019-03-29 LAB — LDL CHOLESTEROL, DIRECT: Direct LDL: 132 mg/dL

## 2019-03-29 LAB — TSH: TSH: 1.61 u[IU]/mL (ref 0.35–4.50)

## 2019-03-29 LAB — VITAMIN D 25 HYDROXY (VIT D DEFICIENCY, FRACTURES): VITD: 37.87 ng/mL (ref 30.00–100.00)

## 2019-04-01 ENCOUNTER — Encounter: Payer: Self-pay | Admitting: Neurology

## 2019-04-11 ENCOUNTER — Other Ambulatory Visit: Payer: Self-pay | Admitting: Internal Medicine

## 2019-04-20 ENCOUNTER — Ambulatory Visit: Payer: Medicare Other | Admitting: Neurology

## 2019-04-22 ENCOUNTER — Telehealth: Payer: Self-pay

## 2019-04-22 NOTE — Telephone Encounter (Signed)
-----   Message from Jearld Fenton, NP sent at 03/28/2019  8:25 PM EDT ----- Need a copy of diabetic eye exam

## 2019-04-22 NOTE — Telephone Encounter (Signed)
DM eye exam was requested from [facility]

## 2019-04-28 ENCOUNTER — Ambulatory Visit: Payer: Medicare Other | Admitting: Neurology

## 2019-05-06 ENCOUNTER — Other Ambulatory Visit: Payer: Self-pay | Admitting: Internal Medicine

## 2019-05-25 ENCOUNTER — Ambulatory Visit: Payer: Medicare Other | Admitting: Neurology

## 2019-06-20 ENCOUNTER — Ambulatory Visit: Payer: Medicare Other | Admitting: Neurology

## 2019-06-24 ENCOUNTER — Ambulatory Visit: Payer: Medicare Other | Admitting: Neurology

## 2019-06-24 ENCOUNTER — Other Ambulatory Visit: Payer: Self-pay

## 2019-06-24 ENCOUNTER — Encounter: Payer: Self-pay | Admitting: Neurology

## 2019-06-24 VITALS — BP 153/68 | HR 72 | Ht 66.0 in | Wt 190.1 lb

## 2019-06-24 DIAGNOSIS — F0390 Unspecified dementia without behavioral disturbance: Secondary | ICD-10-CM

## 2019-06-24 DIAGNOSIS — F03B0 Unspecified dementia, moderate, without behavioral disturbance, psychotic disturbance, mood disturbance, and anxiety: Secondary | ICD-10-CM

## 2019-06-24 MED ORDER — DONEPEZIL HCL 10 MG PO TABS: ORAL_TABLET | ORAL | 11 refills | Status: DC

## 2019-06-24 MED ORDER — MEMANTINE HCL 10 MG PO TABS: ORAL_TABLET | ORAL | 3 refills | Status: DC

## 2019-06-24 NOTE — Progress Notes (Signed)
NEUROLOGY FOLLOW UP OFFICE NOTE  Michelle Vang [id number]  DOB: [birth year]  HISTORY OF PRESENT ILLNESS: I had the pleasure of seeing Michelle Vang in follow-up in the neurology clinic on 06/24/2019. The patient was last seen 9 months ago for dementia. MMSE 27/30 in January 2020 (28/30 in June 2019, 27/30 in June 2018 and November 2017, 25/30 in April 2017). She is again accompanied by her daughter who helps supplement the history today. She states that as long as she is reminded of things, she does well. Her daughter reports worsening of memory. She lives alone, her daughter came by yesterday and found that she had burnt a pot on the stove. She stopped driving last March 0539, her daughter got a call that her mother was at the [address] way down outside of [place] and seemed confused, the sheriff heard her asking people in the parking lot for directions and called her daughter. Her daughter fixes her pillbox and calls her regularly to remind to take it. She is on Memantine 10mg  BID without side effects. She is independent with dressing and bathing. She denies any headaches, vision changes, focal numbness/tingling/weakness, no falls. She occasionally feels dizzy when she gets up in the morning. No paranoia or hallucinations, sleep is good.   History on Initial Assessment 12/24/2015: This is a pleasant 85 yo RH woman with a history of hypertension, hypothyroidism, lung cancer s/p partial lobectomy, bladder cancer, melanoma, who presented for worsening memory. She lives by herself. Her daughter started noticing memory changes around 5-6 months ago, she would ask the same question or say the same information repeatedly. She feels she is losing her memory but does not want to think it is extensive. She has noticed she misplaces things a lot. She denies any missed bill payments, she occasionally forgets to take her medication but states this is not a regular occurrence. Her daughter took over her checkbook  in January because she was very generous giving donations to every cause she got a letter from. She denies leaving the stove on. She continues to drive without getting lost. 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 She is able to keep her hair appointments and pick up her sister without any difficulties. Her daughter denies any personality changes, hygiene is good, no difficulties with ADLs. She was started on Namenda by her PCP 3-4 months ago, increased to 10mg  BID last month because daughter reported no improvement with memory. She denies any side effects to Namenda. She has not tried any other medications such as Aricept. There is frequent constipation. She lost her sense of smell years ago when she had the lung cancer 25 years ago.  She denies any family history of dementia, no history of head injuries or alcohol intake.   PAST MEDICAL HISTORY: Past Medical History:  Diagnosis Date  . Bladder cancer ([place])   . GERD (gastroesophageal reflux disease)   . Hypertension   . Lung cancer ([place])   . Thyroid disease     MEDICATIONS: Current Outpatient Medications on File Prior to Visit  Medication Sig Dispense Refill  . Cholecalciferol (VITAMIN D3) 1000 UNITS CAPS Take 2 capsules by mouth daily. Reported on 11/15/2015    . levothyroxine (SYNTHROID) 88 MCG tablet TAKE 1 TABLET BY MOUTH ONCE DAILY 90 tablet 2  . losartan (COZAAR) 50 MG tablet TAKE 1 TABLET BY MOUTH ONCE DAILY 90 tablet 1  . memantine (NAMENDA) 10  MG tablet Take 1 tablet twice a day 180 tablet 3  . Omega-3 Fatty Acids (FISH OIL) 1000 MG CAPS Take 1 capsule by mouth daily.     No current facility-administered medications on file prior to visit.     ALLERGIES: No Known Allergies  FAMILY HISTORY: Family History  Problem Relation Age of Onset  . Stroke Mother   . Heart disease Father   . Cancer Sister        Pancreatic    SOCIAL HISTORY: Social History   Socioeconomic  History  . Marital status: Widowed    Spouse name: Not on file  . Number of children: 2  . Years of education: Not on file  . Highest education level: Not on file  Occupational History  . Occupation: Part time Scientist, water quality  Social Needs  . Financial resource strain: Not on file  . Food insecurity    Worry: Not on file    Inability: Not on file  . Transportation needs    Medical: Not on file    Non-medical: Not on file  Tobacco Use  . Smoking status: Never Smoker  . Smokeless tobacco: Never Used  Substance and Sexual Activity  . Alcohol use: No    Alcohol/week: 0.0 standard drinks  . Drug use: No  . Sexual activity: Not Currently  Lifestyle  . Physical activity    Days per week: Not on file    Minutes per session: Not on file  . Stress: Not on file  Relationships  . Social Herbalist on phone: Not on file    Gets together: Not on file    Attends religious service: Not on file    Active member of club or organization: Not on file    Attends meetings of clubs or organizations: Not on file    Relationship status: Not on file  . Intimate partner violence    Fear of current or ex partner: Not on file    Emotionally abused: Not on file    Physically abused: Not on file    Forced sexual activity: Not on file  Other Topics Concern  . Not on file  Social History Narrative   Right handed      Completed 12th grade      Lives alone    REVIEW OF SYSTEMS: Constitutional: No fevers, chills, or sweats, no generalized fatigue, change in appetite Eyes: No visual changes, double vision, eye pain Ear, nose and throat: No hearing loss, ear pain, nasal congestion, sore throat Cardiovascular: No chest pain, palpitations Respiratory:  No shortness of breath at rest or with exertion, wheezes GastrointestinaI: No nausea, vomiting, diarrhea, abdominal pain, fecal incontinence Genitourinary:  No dysuria, urinary retention or frequency Musculoskeletal:  No neck pain, back pain  Integumentary: No rash, pruritus, skin lesions Neurological: as above Psychiatric: No depression, insomnia, anxiety Endocrine: No palpitations, fatigue, diaphoresis, mood swings, change in appetite, change in weight, increased thirst Hematologic/Lymphatic:  No anemia, purpura, petechiae. Allergic/Immunologic: no itchy/runny eyes, nasal congestion, recent allergic reactions, rashes  PHYSICAL EXAM: Vitals:   06/24/19 1551  BP: (!) 153/68  Pulse: 72  SpO2: 99%   General: No acute distress Head:  Normocephalic/atraumatic Skin/Extremities: No rash, no edema Neurological Exam: alert and oriented to person, place. No aphasia or dysarthria. Fund of knowledge is appropriate.  Recent and remote memory impaired. Attention and concentration are reduced.Reduced fluency.  Montreal Cognitive Assessment  06/24/2019  Visuospatial/ Executive (0/5) 3  Naming (0/3) 2  Attention:  Read list of digits (0/2) 1  Attention: Read list of letters (0/1) 0  Attention: Serial 7 subtraction starting at 100 (0/3) 2  Language: Repeat phrase (0/2) 1  Language : Fluency (0/1) 0  Abstraction (0/2) 0  Delayed Recall (0/5) 0  Orientation (0/6) 2  Total 11  Adjusted Score (based on education) 12    MMSE - Mini Mental State Exam 09/27/2018 02/19/2018 02/16/2017  Orientation to time 2 3 2   Orientation to Place 5 5 5   Registration 3 3 3   Attention/ Calculation 5 5 5   Recall 3 3 3   Language- name 2 objects 2 2 2   Language- repeat 1 1 1   Language- follow 3 step command 3 3 3   Language- read & follow direction 1 1 1   Write a sentence 1 1 1   Copy design 1 1 1   Total score 27 28 27    Cranial nerves: Pupils equal, round, reactive to light.  Extraocular movements intact with no nystagmus. Visual fields full. Facial sensation intact. No facial asymmetry. Tongue, uvula, palate midline.  Motor: Bulk and tone normal, muscle strength 5/5 throughout with no pronator drift.  Finger to nose testing intact.  Gait slightly  wide-based, no ataxia.  IMPRESSION: This is a pleasant 85 yo RH woman with a history of hypertension, hypothyroidism, lung cancer s/p partial lobectomy, bladder cancer, melanoma, with moderate dementia without behavioral disturbance. We discussed adding on Donepezil to Memantine, side effects and expectations were discussed, start Donepezil 5mg  daily for 2 weeks, then increase to 10mg  daily. Continue Memantine 10mg  BID. We discussed no further driving and increasing supervision at home. We again discussed the importance of control of vascular risk factors, physical exercise, and brain stimulation exercises for brain health. She was encouraged to join [facility] activities. She will follow-up in 6 months and knows to call for any changes.   Thank you for allowing me to participate in her care.  Please do not hesitate to call for any questions or concerns.  The duration of this appointment visit was 30 minutes of face-to-face time with the patient.  Greater than 50% of this time was spent in counseling, explanation of diagnosis, planning of further management, and coordination of care.   Ellouise Newer, M.D.   CC: Webb Silversmith, NP

## 2019-06-24 NOTE — Patient Instructions (Signed)
1. Start Donepezil (Aricept) 10mg : Take 1/2 tablet daily for 2 weeks, then increase to 1 tablet daily  2. Continue Memantine (Namenda) 10mg  twice a day  3. No further driving  4. Increase supervision at home  5. Follow-up in 6 months, call for any changes  FALL PRECAUTIONS: Be cautious when walking. Scan the area for obstacles that may increase the risk of trips and falls. When getting up in the mornings, sit up at the edge of the bed for a few minutes before getting out of bed. Consider elevating the bed at the head end to avoid drop of blood pressure when getting up. Walk always in a well-lit room (use night lights in the walls). Avoid area rugs or power cords from appliances in the middle of the walkways. Use a walker or a cane if necessary and consider physical therapy for balance exercise. Get your eyesight checked regularly.  FINANCIAL OVERSIGHT: Supervision, especially oversight when making financial decisions or transactions is also recommended.  HOME SAFETY: Consider the safety of the kitchen when operating appliances like stoves, microwave oven, and blender. Consider having supervision and share cooking responsibilities until no longer able to participate in those. Accidents with firearms and other hazards in the house should be identified and addressed as well.  DRIVING: Regarding driving, in patients with progressive memory problems, driving will be impaired. We advise to have someone else do the driving if trouble finding directions or if minor accidents are reported. Independent driving assessment is available to determine safety of driving.  ABILITY TO BE LEFT ALONE: If patient is unable to contact 911 operator, consider using LifeLine, or when the need is there, arrange for someone to stay with patients. Smoking is a fire hazard, consider supervision or cessation. Risk of wandering should be assessed by caregiver and if detected at any point, supervision and safe proof  recommendations should be instituted.  MEDICATION SUPERVISION: Inability to self-administer medication needs to be constantly addressed. Implement a mechanism to ensure safe administration of the medications.  RECOMMENDATIONS FOR ALL PATIENTS WITH MEMORY PROBLEMS: 1. Continue to exercise (Recommend 30 minutes of walking everyday, or 3 hours every week) 2. Increase social interactions - continue going to [place] and enjoy social gatherings with friends and family 3. Eat healthy, avoid fried foods and eat more fruits and vegetables 4. Maintain adequate blood pressure, blood sugar, and blood cholesterol level. Reducing the risk of stroke and cardiovascular disease also helps promoting better memory. 5. Avoid stressful situations. Live a simple life and avoid aggravations. Organize your time and prepare for the next day in anticipation. 6. Sleep well, avoid any interruptions of sleep and avoid any distractions in the bedroom that may interfere with adequate sleep quality 7. Avoid sugar, avoid sweets as there is a strong link between excessive sugar intake, diabetes, and cognitive impairment The Mediterranean diet has been shown to help patients reduce the risk of progressive memory disorders and reduces cardiovascular risk. This includes eating fish, eat fruits and green leafy vegetables, nuts like almonds and hazelnuts, walnuts, and also use olive oil. Avoid fast foods and fried foods as much as possible. Avoid sweets and sugar as sugar use has been linked to worsening of memory function.  There is always a concern of gradual progression of memory problems. If this is the case, then we may need to adjust level of care according to patient needs. Support, both to the patient and caregiver, should then be put into place.

## 2019-08-15 ENCOUNTER — Telehealth: Payer: Self-pay | Admitting: Neurology

## 2019-08-15 ENCOUNTER — Ambulatory Visit: Payer: Self-pay | Admitting: *Deleted

## 2019-08-15 NOTE — Telephone Encounter (Signed)
Left message for patient to call back regarding her Donepezil medication.

## 2019-08-15 NOTE — Telephone Encounter (Signed)
Would recommend she call neurology to see if they think it is med related. If not, would recommend UC or ER eval.

## 2019-08-15 NOTE — Telephone Encounter (Signed)
Daughter Allayne Stack calling in.     Her mother is c/o being dizzy and her stomach is upset.   She went to the neurologist and was started on a new medication for her memory.   The neurologist said it can cause upset stomach and dizziness.    She took her last dose Saturday.   I've not given her any since Saturday.    The dizziness has been going on for several months on and off.    She did not eat pumpkin pie on Thanksgiving which is very different for her.    See triage notes for more details.   I don't know what I should do.  Should I call the neurologist or what?    I called into the [employer] and spoke with Levada Dy.   She checked with their nurse and they have asked that I send a message over and they will see what the provider wants to do.  When I went back to [place] the line must have dropped or become disconnected.   I attempted 3 times to call [name] back but got her voicemail all 3 times.  I forwarded these notes to the practice for further disposition.       Reason for Disposition . [1] Caller has URGENT medication question about med that PCP or specialist prescribed AND [2] triager unable to answer question    Neurologist prescribed Donepezil and now it's upsetting her stomach and possibly causing her to be dizzy.   Been dizzy off and on for months per [name], the daughter.  Answer Assessment - Initial Assessment Questions 1.   NAME of MEDICATION: "What medicine are you calling about?"  Daughter, Allayne Stack calling in for her mother.   Donepezil 10 mg 2.   QUESTION: "What is your question?"     The neurologist that prescribed this for her recently said it could cause dizziness and an upset stomach.    My mother is experiencing an upset stomach so much so she didn't eat her pumpkin pie on Thanksgiving which she never skips out on her sweets.    She has been having the dizziness on and off for months. 3.   PRESCRIBING HCP: "Who prescribed it?" Reason: if  prescribed by specialist, call should be referred to that group.     The neurologist Dr. Delice Lesch. 4. SYMPTOMS: "Do you have any symptoms?"     Upset stomach, nausea and not eating much. 5. SEVERITY: If symptoms are present, ask "Are they mild, moderate or severe?"     She has a poor appetite. 6.  PREGNANCY:  "Is there any chance that you are pregnant?" "When was your last menstrual period?"     N/A due to age.  Protocols used: MEDICATION QUESTION CALL-A-AH

## 2019-08-18 ENCOUNTER — Encounter: Payer: Self-pay | Admitting: Emergency Medicine

## 2019-08-18 ENCOUNTER — Telehealth: Payer: Self-pay

## 2019-08-18 ENCOUNTER — Other Ambulatory Visit: Payer: Self-pay

## 2019-08-18 ENCOUNTER — Ambulatory Visit
Admission: EM | Admit: 2019-08-18 | Discharge: 2019-08-18 | Disposition: A | Discharge status: Home / Self Care | Payer: Medicare Other | Attending: Family Medicine | Admitting: Family Medicine

## 2019-08-18 DIAGNOSIS — R42 Dizziness and giddiness: Secondary | ICD-10-CM | POA: Diagnosis not present

## 2019-08-18 DIAGNOSIS — N3001 Acute cystitis with hematuria: Secondary | ICD-10-CM | POA: Insufficient documentation

## 2019-08-18 DIAGNOSIS — E860 Dehydration: Secondary | ICD-10-CM | POA: Insufficient documentation

## 2019-08-18 LAB — POCT URINALYSIS DIP (MANUAL ENTRY)
Glucose, UA: NEGATIVE mg/dL
Ketones, POC UA: NEGATIVE mg/dL
Nitrite, UA: NEGATIVE
Spec Grav, UA: 1.02 (ref 1.010–1.025)
Urobilinogen, UA: 1 E.U./dL
pH, UA: 6 (ref 5.0–8.0)

## 2019-08-18 MED ORDER — CEPHALEXIN 500 MG PO CAPS: 500.0000 mg | ORAL_CAPSULE | Freq: Two times a day (BID) | ORAL | 0 refills | Status: AC

## 2019-08-18 MED ORDER — CEFTRIAXONE SODIUM 1 G IJ SOLR
1.0000 g | Freq: Once | INTRAMUSCULAR | Status: AC
  Administered 2019-08-18: 1 g via INTRAMUSCULAR

## 2019-08-18 MED ORDER — SODIUM CHLORIDE 0.9 % IV BOLUS
500.0000 mL | Freq: Once | INTRAVENOUS | Status: AC
  Administered 2019-08-18: 500 mL via INTRAVENOUS

## 2019-08-18 NOTE — Telephone Encounter (Signed)
Noted. That is a difficulty thing to see virtually and we only have 4:15 available, which I would not recommend her waiting until that time. Will follow up UC notes.

## 2019-08-18 NOTE — Telephone Encounter (Signed)
Michelle Vang, patient's daughter, called stating that patient is still not feeling well and has generalized weakness and dizziness. Her upset stomach has resolved. She has stopped taking the new memory medication. Spoke with Michelle Vang and advised Michelle Vang to take patient to UC or ER. Michelle Vang will take patient to [facility] in [place] and go from there. FYI to PCP

## 2019-08-18 NOTE — ED Triage Notes (Signed)
Per family member, pt has hx of dizziness, states the dizziness has gotten worse over the past week. Family member concerned ffor dehydration.

## 2019-08-18 NOTE — Discharge Instructions (Signed)
You have a urinary tract infection.  We gave you some antibiotics here today in clinic and we will send you home with some more antibiotics. We also gave you some fluids here to rehydrate you You need to go home and drink fluids and rest If your symptoms worsen you will need to go to the hospital.

## 2019-08-18 NOTE — Telephone Encounter (Signed)
Noted, thanks!

## 2019-08-18 NOTE — Telephone Encounter (Signed)
Patient complained of upset stomach.  Patient has stopped medication at this time.  Patient is having general weakness and dizziness.  Advised daughter that is could be she is dehydrated from diarrhea.  Daughter is going to contact PCP or take patient to urgent care.  Virtual appointment offered but declined for now.

## 2019-08-18 NOTE — ED Provider Notes (Signed)
[facility]    CSN: [id number] Arrival date & time: 08/18/19  1408      History   Chief Complaint Chief Complaint  Patient presents with  . Dizziness    HPI ZUZU BEFORT is a 85 y.o. female.   Patient is a 85 year old female with past medical history of GERD, cancer, hypertension, thyroid disease.  She presents today with her daughter.  Says for the past 3 days or so she has been having dizziness/lightheadedness.  Worse when changing positions.  This is been constant and worsening.  She is also had some associated weakness.  Lower abdominal discomfort that has subsided.   Admits to not drinking or eating much recently.  Denies any current pain anywhere.  Denies any nausea, vomiting, diarrhea, cough, chest congestion, fevers.  Denies any urinary symptoms.  ROS per HPI      Past Medical History:  Diagnosis Date  . Bladder cancer ([place])   . GERD (gastroesophageal reflux disease)   . Hypertension   . Lung cancer ([place])   . Thyroid disease     Patient Active Problem List   Diagnosis Date Noted  . Mild cognitive impairment with memory loss 03/28/2019  . Type 2 diabetes mellitus without complication, without long-term current use of insulin ([place]) 12/22/2017  . History of bladder cancer 12/24/2015  . Thyroid activity decreased 10/02/2015  . GERD 01/11/2009  . Essential hypertension 12/12/2008  . NEOPLASM, MALIGNANT, LUNG, HX OF 12/12/2008    Past Surgical History:  Procedure Laterality Date  . CYSTOSCOPY    . LUNG CANCER SURGERY     Removed 1 lobe on Right lung  . SKIN CANCER EXCISION  2014   Melanoma    OB History   No obstetric history on file.      Home Medications    Prior to Admission medications   Medication Sig Start Date End Date Taking? Authorizing Provider  PRESCRIPTION MEDICATION doceptil   Yes [provider]  cephALEXin (KEFLEX) 500 MG capsule Take 1 capsule (500 mg total) by mouth 2 (two) times daily for 7 days. 08/18/19  08/25/19  Loura Halt A, NP  Cholecalciferol (VITAMIN D3) 1000 UNITS CAPS Take 2 capsules by mouth daily. Reported on 11/15/2015    [provider]  levothyroxine (SYNTHROID) 88 MCG tablet TAKE 1 TABLET BY MOUTH ONCE DAILY 04/12/19   Jearld Fenton, NP  losartan (COZAAR) 50 MG tablet TAKE 1 TABLET BY MOUTH ONCE DAILY 05/06/19   Jearld Fenton, NP  memantine [facility]) 10 MG tablet Take 1 tablet twice a day 06/24/19   Cameron Sprang, MD  Omega-3 Fatty Acids (FISH OIL) 1000 MG CAPS Take 1 capsule by mouth daily.    [provider]    Family History Family History  Problem Relation Age of Onset  . Stroke Mother   . Heart disease Father   . Cancer Sister        Pancreatic    Social History Social History   Tobacco Use  . Smoking status: Never Smoker  . Smokeless tobacco: Never Used  Substance Use Topics  . Alcohol use: No    Alcohol/week: 0.0 standard drinks  . Drug use: No     Allergies   Aricept [donepezil hcl]   Review of Systems Review of Systems   Physical Exam Triage Vital Signs ED Triage Vitals  Enc Vitals Group     BP 08/18/19 1415 113/73     Pulse Rate 08/18/19 1415 98  Resp 08/18/19 1415 16     Temp 08/18/19 1415 (!) 97.4 F (36.3 C)     Temp src --      SpO2 08/18/19 1415 97 %     Weight --      Height --      Head Circumference --      Peak Flow --      Pain Score 08/18/19 1412 0     Pain Loc --      Pain Edu? --      Excl. in [place]? --    Orthostatic VS for the past 24 hrs:  BP- Lying Pulse- Lying BP- Sitting Pulse- Sitting BP- Standing at 0 minutes Pulse- Standing at 0 minutes  08/18/19 1416 113/73 100 98/64 94 (!) 83/53 100    Updated Vital Signs BP 113/73   Pulse 98   Temp (!) 97.4 F (36.3 C)   Resp 16   SpO2 97%   Visual Acuity Right Eye Distance:   Left Eye Distance:   Bilateral Distance:    Right Eye Near:   Left Eye Near:    Bilateral Near:     Physical Exam Vitals signs and nursing note reviewed.   Constitutional:      General: She is not in acute distress.    Appearance: She is not ill-appearing, toxic-appearing or diaphoretic.     Comments: HOH   HENT:     Head: Normocephalic and atraumatic.     Nose: Nose normal.     Mouth/Throat:     Mouth: Mucous membranes are dry.  Eyes:     Extraocular Movements: Extraocular movements intact.     Conjunctiva/sclera: Conjunctivae normal.     Pupils: Pupils are equal, round, and reactive to light.  Neck:     Musculoskeletal: Normal range of motion.  Cardiovascular:     Rate and Rhythm: Normal rate and regular rhythm.  Pulmonary:     Effort: Pulmonary effort is normal.     Breath sounds: Normal breath sounds.  Abdominal:     Palpations: Abdomen is soft.     Tenderness: There is no abdominal tenderness.  Musculoskeletal: Normal range of motion.  Skin:    General: Skin is warm and dry.  Neurological:     General: No focal deficit present.     Mental Status: She is alert. Mental status is at baseline.     Motor: Weakness present.     Comments: Generalized weakness No facial droop, slurred speech or unilateral weakness   Psychiatric:        Mood and Affect: Mood normal.      UC Treatments / Results  Labs (all labs ordered are listed, but only abnormal results are displayed) Labs Reviewed  POCT URINALYSIS DIP (MANUAL ENTRY) - Abnormal; Notable for the following components:      Result Value   Clarity, UA cloudy (*)    Bilirubin, UA small (*)    Blood, UA trace-intact (*)    Protein Ur, POC trace (*)    Leukocytes, UA Large (3+) (*)    All other components within normal limits  URINE CULTURE    EKG   Radiology No results found.  Procedures Procedures (including critical care time)  Medications Ordered in UC Medications  cefTRIAXone (ROCEPHIN) injection 1 g (1 g Intramuscular Given 08/18/19 1614)  sodium chloride 0.9 % bolus 500 mL (500 mLs Intravenous New Bag/Given 08/18/19 1614)    Initial Impression /  Assessment and Plan / UC  Course  I have reviewed the triage vital signs and the nursing notes.  Pertinent labs & imaging results that were available during my care of the patient were reviewed by me and considered in my medical decision making (see chart for details).     Patient is an 85 year old female that presents with daughter today for generalized weakness, lightheadedness for the past few days.  Per daughter she probably is not drinking enough fluids.  Urine here revealed large leuks and blood and cloudy.  Will send for culture. Urinary tract infection would explain her symptoms. She is mildly dehydrated with orthostatic changes here today We will rehydrate her with 500 of normal saline and give her 1 g of Rocephin to treat the urinary tract infection. Sending home with Keflex for further treatment of UTI. Recommended to daughter that if she does not improve and worsens she will need to go to the ER. They are understanding and agree. Final Clinical Impressions(s) / UC Diagnoses   Final diagnoses:  Acute cystitis with hematuria  Dehydration     Discharge Instructions     You have a urinary tract infection.  We gave you some antibiotics here today in clinic and we will send you home with some more antibiotics. We also gave you some fluids here to rehydrate you You need to go home and drink fluids and rest If your symptoms worsen you will need to go to the hospital.    ED Prescriptions    Medication Sig Dispense Auth. Provider   cephALEXin (KEFLEX) 500 MG capsule Take 1 capsule (500 mg total) by mouth 2 (two) times daily for 7 days. 14 capsule Hardie Veltre A, NP     PDMP not reviewed this encounter.   Orvan July, NP 08/18/19 1641

## 2019-08-19 LAB — URINE CULTURE

## 2019-08-24 NOTE — Telephone Encounter (Signed)
There was correspondence with Neuro and pt went to ED 1 week ago

## 2019-09-19 ENCOUNTER — Other Ambulatory Visit: Payer: Self-pay | Admitting: Internal Medicine

## 2019-12-01 ENCOUNTER — Ambulatory Visit: Payer: Medicare Other | Attending: Internal Medicine

## 2019-12-01 DIAGNOSIS — Z23 Encounter for immunization: Secondary | ICD-10-CM

## 2019-12-01 NOTE — Progress Notes (Signed)
   Covid-19 Vaccination Clinic  Name:  Michelle Vang    MRN: [id number] DOB: 1928-11-26  12/01/2019  [name] was observed post Covid-19 immunization for 15 minutes without incident. She was provided with Vaccine Information Sheet and instruction to access the V-Safe system.   [name] was instructed to call 911 with any severe reactions post vaccine: Marland Kitchen Difficulty breathing  . Swelling of face and throat  . A fast heartbeat  . A bad rash all over body  . Dizziness and weakness   Immunizations Administered    Name Date Dose VIS Date Route   Pfizer COVID-19 Vaccine 12/01/2019 11:47 AM 0.3 mL 08/26/2019 Intramuscular   Manufacturer: [place]   Lot: [id number]   [place]: [id number]

## 2019-12-13 ENCOUNTER — Other Ambulatory Visit: Payer: Self-pay | Admitting: Internal Medicine

## 2019-12-27 ENCOUNTER — Ambulatory Visit: Payer: Medicare Other | Attending: Internal Medicine

## 2019-12-27 DIAGNOSIS — Z23 Encounter for immunization: Secondary | ICD-10-CM

## 2019-12-27 NOTE — Progress Notes (Signed)
   Covid-19 Vaccination Clinic  Name:  HERAN CAMPAU    MRN: [id number] DOB: October 23, 1928  12/27/2019  Ms. Stantz was observed post Covid-19 immunization for 15 minutes without incident. She was provided with Vaccine Information Sheet and instruction to access the V-Safe system.   Ms. Roselli was instructed to call 911 with any severe reactions post vaccine: Marland Kitchen Difficulty breathing  . Swelling of face and throat  . A fast heartbeat  . A bad rash all over body  . Dizziness and weakness   Immunizations Administered    Name Date Dose VIS Date Route   Pfizer COVID-19 Vaccine 12/27/2019 10:15 AM 0.3 mL 08/26/2019 Intramuscular   Manufacturer: [place]   Lot: [id number]   [place]: [id number]

## 2020-01-23 ENCOUNTER — Encounter: Payer: Self-pay | Admitting: Neurology

## 2020-01-23 ENCOUNTER — Ambulatory Visit (INDEPENDENT_AMBULATORY_CARE_PROVIDER_SITE_OTHER): Payer: Medicare Other | Admitting: Internal Medicine

## 2020-01-23 ENCOUNTER — Other Ambulatory Visit: Payer: Self-pay

## 2020-01-23 ENCOUNTER — Ambulatory Visit: Payer: Medicare Other | Admitting: Neurology

## 2020-01-23 ENCOUNTER — Encounter: Payer: Self-pay | Admitting: Internal Medicine

## 2020-01-23 VITALS — BP 100/62 | HR 112 | Temp 98.0°F | Ht 66.0 in | Wt 197.0 lb

## 2020-01-23 DIAGNOSIS — M545 Low back pain, unspecified: Secondary | ICD-10-CM

## 2020-01-23 DIAGNOSIS — R350 Frequency of micturition: Secondary | ICD-10-CM

## 2020-01-23 DIAGNOSIS — F0390 Unspecified dementia without behavioral disturbance: Secondary | ICD-10-CM | POA: Diagnosis not present

## 2020-01-23 DIAGNOSIS — F03B0 Unspecified dementia, moderate, without behavioral disturbance, psychotic disturbance, mood disturbance, and anxiety: Secondary | ICD-10-CM

## 2020-01-23 LAB — POCT URINALYSIS DIPSTICK
Bilirubin, UA: NEGATIVE
Blood, UA: NEGATIVE
Glucose, UA: POSITIVE — AB
Ketones, UA: NEGATIVE
Nitrite, UA: NEGATIVE
Protein, UA: NEGATIVE
Spec Grav, UA: 1.015 (ref 1.010–1.025)
Urobilinogen, UA: 0.2 E.U./dL
pH, UA: 6 (ref 5.0–8.0)

## 2020-01-23 MED ORDER — MEMANTINE HCL 10 MG PO TABS: ORAL_TABLET | ORAL | 3 refills | Status: DC

## 2020-01-23 MED ORDER — NITROFURANTOIN MONOHYD MACRO 100 MG PO CAPS: 100.0000 mg | ORAL_CAPSULE | Freq: Two times a day (BID) | ORAL | 0 refills | Status: DC

## 2020-01-23 NOTE — Patient Instructions (Signed)
1. Continue Memantine 10mg  twice a day  2. Continue close supervision  3. Follow-up in 6 months, call for any changes  FALL PRECAUTIONS: Be cautious when walking. Scan the area for obstacles that may increase the risk of trips and falls. When getting up in the mornings, sit up at the edge of the bed for a few minutes before getting out of bed. Consider elevating the bed at the head end to avoid drop of blood pressure when getting up. Walk always in a well-lit room (use night lights in the walls). Avoid area rugs or power cords from appliances in the middle of the walkways. Use a walker or a cane if necessary and consider physical therapy for balance exercise. Get your eyesight checked regularly.   HOME SAFETY: Consider the safety of the kitchen when operating appliances like stoves, microwave oven, and blender. Consider having supervision and share cooking responsibilities until no longer able to participate in those. Accidents with firearms and other hazards in the house should be identified and addressed as well.   ABILITY TO BE LEFT ALONE: If patient is unable to contact 911 operator, consider using LifeLine, or when the need is there, arrange for someone to stay with patients. Smoking is a fire hazard, consider supervision or cessation. Risk of wandering should be assessed by caregiver and if detected at any point, supervision and safe proof recommendations should be instituted.  MEDICATION SUPERVISION: Inability to self-administer medication needs to be constantly addressed. Implement a mechanism to ensure safe administration of the medications.  RECOMMENDATIONS FOR ALL PATIENTS WITH MEMORY PROBLEMS: 1. Continue to exercise (Recommend 30 minutes of walking everyday, or 3 hours every week) 2. Increase social interactions - continue going to [place] and enjoy social gatherings with friends and family 3. Eat healthy, avoid fried foods and eat more fruits and vegetables 4. Maintain adequate blood  pressure, blood sugar, and blood cholesterol level. Reducing the risk of stroke and cardiovascular disease also helps promoting better memory. 5. Avoid stressful situations. Live a simple life and avoid aggravations. Organize your time and prepare for the next day in anticipation. 6. Sleep well, avoid any interruptions of sleep and avoid any distractions in the bedroom that may interfere with adequate sleep quality 7. Avoid sugar, avoid sweets as there is a strong link between excessive sugar intake, diabetes, and cognitive impairment The Mediterranean diet has been shown to help patients reduce the risk of progressive memory disorders and reduces cardiovascular risk. This includes eating fish, eat fruits and green leafy vegetables, nuts like almonds and hazelnuts, walnuts, and also use olive oil. Avoid fast foods and fried foods as much as possible. Avoid sweets and sugar as sugar use has been linked to worsening of memory function.  There is always a concern of gradual progression of memory problems. If this is the case, then we may need to adjust level of care according to patient needs. Support, both to the patient and caregiver, should then be put into place.

## 2020-01-23 NOTE — Patient Instructions (Signed)
Urinary Tract Infection, Adult A urinary tract infection (UTI) is an infection of any part of the urinary tract. The urinary tract includes:  The kidneys.  The ureters.  The bladder.  The urethra. These organs make, store, and get rid of pee (urine) in the body. What are the causes? This is caused by germs (bacteria) in your genital area. These germs grow and cause swelling (inflammation) of your urinary tract. What increases the risk? You are more likely to develop this condition if:  You have a small, thin tube (catheter) to drain pee.  You cannot control when you pee or poop (incontinence).  You are female, and: ? You use these methods to prevent pregnancy:  A medicine that kills sperm (spermicide).  A device that blocks sperm (diaphragm). ? You have low levels of a female hormone (estrogen). ? You are pregnant.  You have genes that add to your risk.  You are sexually active.  You take antibiotic medicines.  You have trouble peeing because of: ? A prostate that is bigger than normal, if you are female. ? A blockage in the part of your body that drains pee from the bladder (urethra). ? A kidney stone. ? A nerve condition that affects your bladder (neurogenic bladder). ? Not getting enough to drink. ? Not peeing often enough.  You have other conditions, such as: ? Diabetes. ? A weak disease-fighting system (immune system). ? Sickle cell disease. ? Gout. ? Injury of the spine. What are the signs or symptoms? Symptoms of this condition include:  Needing to pee right away (urgently).  Peeing often.  Peeing small amounts often.  Pain or burning when peeing.  Blood in the pee.  Pee that smells bad or not like normal.  Trouble peeing.  Pee that is cloudy.  Fluid coming from the vagina, if you are female.  Pain in the belly or lower back. Other symptoms include:  Throwing up (vomiting).  No urge to eat.  Feeling mixed up (confused).  Being tired  and grouchy (irritable).  A fever.  Watery poop (diarrhea). How is this treated? This condition may be treated with:  Antibiotic medicine.  Other medicines.  Drinking enough water. Follow these instructions at home:  Medicines  Take over-the-counter and prescription medicines only as told by your doctor.  If you were prescribed an antibiotic medicine, take it as told by your doctor. Do not stop taking it even if you start to feel better. General instructions  Make sure you: ? Pee until your bladder is empty. ? Do not hold pee for a long time. ? Empty your bladder after sex. ? Wipe from front to back after pooping if you are a female. Use each tissue one time when you wipe.  Drink enough fluid to keep your pee pale yellow.  Keep all follow-up visits as told by your doctor. This is important. Contact a doctor if:  You do not get better after 1-2 days.  Your symptoms go away and then come back. Get help right away if:  You have very bad back pain.  You have very bad pain in your lower belly.  You have a fever.  You are sick to your stomach (nauseous).  You are throwing up. Summary  A urinary tract infection (UTI) is an infection of any part of the urinary tract.  This condition is caused by germs in your genital area.  There are many risk factors for a UTI. These include having a small, thin   tube to drain pee and not being able to control when you pee or poop.  Treatment includes antibiotic medicines for germs.  Drink enough fluid to keep your pee pale yellow. This information is not intended to replace advice given to you by your health care provider. Make sure you discuss any questions you have with your health care provider. Document Revised: 08/19/2018 Document Reviewed: 03/11/2018 Elsevier Patient Education  2020 [employer]  

## 2020-01-23 NOTE — Progress Notes (Signed)
HPI  Pt presents to the clinic today with c/o urinary frequency and low back pain. This started about 1 week ago. She denies urgency, dysuria or blood in her urine. She denies fever, chills, nausea. She denies vaginal complaints. She has not tried anything OTC for her symptoms.    Review of Systems  Past Medical History:  Diagnosis Date  . Bladder cancer ([place])   . GERD (gastroesophageal reflux disease)   . Hypertension   . Lung cancer ([place])   . Thyroid disease     Family History  Problem Relation Age of Onset  . Stroke Mother   . Heart disease Father   . Cancer Sister        Pancreatic    Social History   Socioeconomic History  . Marital status: Widowed    Spouse name: Not on file  . Number of children: 2  . Years of education: Not on file  . Highest education level: Not on file  Occupational History  . Occupation: Part time Scientist, water quality  Tobacco Use  . Smoking status: Never Smoker  . Smokeless tobacco: Never Used  Substance and Sexual Activity  . Alcohol use: No    Alcohol/week: 0.0 standard drinks  . Drug use: No  . Sexual activity: Not Currently  Other Topics Concern  . Not on file  Social History Narrative   Right handed      Completed 12th grade      Lives alone   Social Determinants of Health   Financial Resource Strain:   . Difficulty of Paying Living Expenses:   Food Insecurity:   . Worried About Charity fundraiser in the Last Year:   . Arboriculturist in the Last Year:   Transportation Needs:   . Film/video editor (Medical):   Marland Kitchen Lack of Transportation (Non-Medical):   Physical Activity:   . Days of Exercise per Week:   . Minutes of Exercise per Session:   Stress:   . Feeling of Stress :   Social Connections:   . Frequency of Communication with Friends and Family:   . Frequency of Social Gatherings with Friends and Family:   . Attends Religious Services:   . Active Member of Clubs or Organizations:   . Attends Archivist  Meetings:   Marland Kitchen Marital Status:   Intimate Partner Violence:   . Fear of Current or Ex-Partner:   . Emotionally Abused:   Marland Kitchen Physically Abused:   . Sexually Abused:     Allergies  Allergen Reactions  . Aricept [Donepezil Hcl] Diarrhea     Constitutional: Denies fever, malaise, fatigue, headache or abrupt weight changes.   GU: Pt reports frequency and low back pain. Denies urgency, dysuria, burning sensation, blood in urine, odor or discharge. Skin: Denies redness, rashes, lesions or ulcercations.   No other specific complaints in a complete review of systems (except as listed in HPI above).    Objective:   Physical Exam  BP 100/62 (BP Location: Right Arm, Patient Position: Sitting, Cuff Size: Large)   Pulse (!) 112   Temp 98 F (36.7 C) (Other (Comment))   Ht 5\' 6"  (1.676 m)   Wt 197 lb (89.4 kg)   SpO2 93%   BMI 31.80 kg/m   Wt Readings from Last 3 Encounters:  06/24/19 190 lb 2 oz (86.2 kg)  03/28/19 179 lb (81.2 kg)  10/28/18 184 lb (83.5 kg)    General: Appears her stated age, obese, in NAD.  Cardiovascular: Tachycardic with normal rhythm. S1,S2 noted.   Pulmonary/Chest: Normal effort and positive vesicular breath sounds. No respiratory distress. No wheezes, rales or ronchi noted.  Abdomen: Soft and nontender. Normal bowel sounds. No distention or masses noted.  No CVA tenderness.        Assessment & Plan:   Frequency, Low Back Pain:  Urinalysis: 2+ leuks Will send urine culture eRx sent if for Macrobid 100 mg BID x 5 days OK to take AZO OTC Drink plenty of fluids  RTC as needed or if symptoms persist. Webb Silversmith, NP This visit occurred during the SARS-CoV-2 public health emergency.  Safety protocols were in place, including screening questions prior to the visit, additional usage of staff PPE, and extensive cleaning of exam room while observing appropriate contact time as indicated for disinfecting solutions.

## 2020-01-23 NOTE — Progress Notes (Signed)
NEUROLOGY FOLLOW UP OFFICE NOTE  Michelle Vang [id number] August 24, 84 1930  HISTORY OF PRESENT ILLNESS: I had the pleasure of seeing Michelle Vang in follow-up in the neurology clinic on 01/23/2020.  The patient was last seen 7 months ago for dementia. She is again accompanied by her daughter who helps supplement the history today. MMSE 27/30 in January 2020. On her last visit, her daughter reported continued progression, we discussed adding on Donepezil to Memantine, however she had upset stomach, diarrhea, dizziness, weakness. She is back to taking Memantine 10mg  BID without side effects. She continues to live alone. She does not drive. Her daughter comes 3-4 times a week and she has an aide coming a few hours a week. Her daughter calls daily to make sure she takes her medications, when she checks, her daughter notes the pillbox is empty. Last week her other daughter visited her and she said everything was okay, then asked if something was wrong to make her visit. She occasionally cooks and has left the stove on. She does not drive. Her daughter manages finances. She is independent with dressing and bathing. She denies any headaches, focal numbness/tingling/weakness. She feels her balance is off sometimes, her daughter has noticed she holds on to walls objects more. No falls. Her daughter denies any clear hallucinations, but there have been 2-3 times where her daughter would think "why would she say that?" They report sleep is good.   History on Initial Assessment 12/24/2015: This is a pleasant 86 yo RH woman with a history of hypertension, hypothyroidism, lung cancer s/p partial lobectomy, bladder cancer, melanoma, who presented for worsening memory. She lives by herself. Her daughter started noticing memory changes around 5-6 months ago, she would ask the same question or say the same information repeatedly. She feels she is losing her memory but does not want to think it is extensive. She has noticed she  misplaces things a lot. She denies any missed bill payments, she occasionally forgets to take her medication but states this is not a regular occurrence. Her daughter took over her checkbook in January because she was very generous giving donations to every cause she got a letter from. She denies leaving the stove on. She continues to drive without getting lost. 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 She is able to keep her hair appointments and pick up her sister without any difficulties. Her daughter denies any personality changes, hygiene is good, no difficulties with ADLs. She was started on Namenda by her PCP 3-4 months ago, increased to 10mg  BID last month because daughter reported no improvement with memory. She denies any side effects to Namenda. She has not tried any other medications such as Aricept. There is frequent constipation. She lost her sense of smell years ago when she had the lung cancer 25 years ago.  She denies any family history of dementia, no history of head injuries or alcohol intake.    PAST MEDICAL HISTORY: Past Medical History:  Diagnosis Date  . Bladder cancer ([place])   . GERD (gastroesophageal reflux disease)   . Hypertension   . Lung cancer ([place])   . Thyroid disease     MEDICATIONS: Current Outpatient Medications on File Prior to Visit  Medication Sig Dispense Refill  . Cholecalciferol (VITAMIN D3) 1000 UNITS CAPS Take 1 capsule by mouth daily. Reported on 11/15/2015    . levothyroxine (SYNTHROID) 88 MCG tablet TAKE 1 TAB BY  MOUTH ONCE DAILY. TAKE ON AN EMPTY STOMACH WITH A GLASS OF WATER ATLEAST 30-60 MINUTES BEFORE BREAKFAST 90 tablet 0  . losartan (COZAAR) 50 MG tablet TAKE 1 TABLET BY MOUTH ONCE DAILY 90 tablet 1  . memantine (NAMENDA) 10 MG tablet Take 1 tablet twice a day 180 tablet 3  . Omega-3 Fatty Acids (FISH OIL) 1000 MG CAPS Take 1 capsule by mouth daily.     No current facility-administered  medications on file prior to visit.   ALLERGIES: Allergies  Allergen Reactions  . Aricept [Donepezil Hcl] Diarrhea    FAMILY HISTORY: Family History  Problem Relation Age of Onset  . Stroke Mother   . Heart disease Father   . Cancer Sister        Pancreatic    SOCIAL HISTORY: Social History   Socioeconomic History  . Marital status: Widowed    Spouse name: Not on file  . Number of children: 2  . Years of education: Not on file  . Highest education level: Not on file  Occupational History  . Occupation: Part time Scientist, water quality  Tobacco Use  . Smoking status: Never Smoker  . Smokeless tobacco: Never Used  Substance and Sexual Activity  . Alcohol use: No    Alcohol/week: 0.0 standard drinks  . Drug use: No  . Sexual activity: Not Currently  Other Topics Concern  . Not on file  Social History Narrative   Right handed      Completed 12th grade      Lives alone   Social Determinants of Health   Financial Resource Strain:   . Difficulty of Paying Living Expenses:   Food Insecurity:   . Worried About Charity fundraiser in the Last Year:   . Arboriculturist in the Last Year:   Transportation Needs:   . Film/video editor (Medical):   Marland Kitchen Lack of Transportation (Non-Medical):   Physical Activity:   . Days of Exercise per Week:   . Minutes of Exercise per Session:   Stress:   . Feeling of Stress :   Social Connections:   . Frequency of Communication with Friends and Family:   . Frequency of Social Gatherings with Friends and Family:   . Attends Religious Services:   . Active Member of Clubs or Organizations:   . Attends Archivist Meetings:   Marland Kitchen Marital Status:   Intimate Partner Violence:   . Fear of Current or Ex-Partner:   . Emotionally Abused:   Marland Kitchen Physically Abused:   . Sexually Abused:     REVIEW OF SYSTEMS: Constitutional: No fevers, chills, or sweats, no generalized fatigue, change in appetite Eyes: No visual changes, double vision, eye  pain Ear, nose and throat: No hearing loss, ear pain, nasal congestion, sore throat Cardiovascular: No chest pain, palpitations Respiratory:  No shortness of breath at rest or with exertion, wheezes GastrointestinaI: No nausea, vomiting, diarrhea, abdominal pain, fecal incontinence Genitourinary:  No dysuria, urinary retention or frequency Musculoskeletal:  No neck pain, back pain Integumentary: No rash, pruritus, skin lesions Neurological: as above Psychiatric: No depression, insomnia, anxiety Endocrine: No palpitations, fatigue, diaphoresis, mood swings, change in appetite, change in weight, increased thirst Hematologic/Lymphatic:  No anemia, purpura, petechiae. Allergic/Immunologic: no itchy/runny eyes, nasal congestion, recent allergic reactions, rashes  PHYSICAL EXAM: Vitals:   01/23/20 1504  BP: 109/71  Pulse: (!) 106  SpO2: 97%   General: No acute distress Head:  Normocephalic/atraumatic Skin/Extremities: No rash, no edema Neurological Exam:  alert and oriented to person, place. States it is Feb 2020, Fall. No aphasia or dysarthria. Fund of knowledge is reduced.  Recent and remote memory are impaired.  Attention and concentration are normal.    Able to name objects and repeat phrases. MMSE 22/30 MMSE - Mini Mental State Exam 01/23/2020 09/27/2018 02/19/2018  Orientation to time 0 2 3  Orientation to Place 4 5 5   Registration 3 3 3   Attention/ Calculation 5 5 5   Recall 1 3 3   Language- name 2 objects 2 2 2   Language- repeat 1 1 1   Language- follow 3 step command 3 3 3   Language- read & follow direction 1 1 1   Write a sentence 1 1 1   Copy design 1 1 1   Total score 22 27 28    Cranial nerves: Pupils equal, round, reactive to light. Extraocular movements intact with no nystagmus. Visual fields full. No facial asymmetry. Motor: Bulk and tone normal, muscle strength 5/5 throughout with no pronator drift.   Finger to nose testing intact.  Gait slow and cautious, no  ataxia   IMPRESSION: This is a pleasant 86 yo RH woman with a history of hypertension, hypothyroidism, lung cancer s/p partial lobectomy, bladder cancer, melanoma, with moderate dementia without behavioral disturbance. MMSE today 22/30. She had side effects on Donepezil, currently on Memantine 10mg  BID. Her daughter reports increasing concern about living alone and would like information about ALF, we discussed the need for increased supervision which the patient does not feel she needs. She does not drive. Continue close supervision. She will follow-up in 6 months and knows to call for any changes.   Thank you for allowing me to participate in her care.  Please do not hesitate to call for any questions or concerns.   Ellouise Newer, M.D.   CC: Webb Silversmith, NP

## 2020-01-25 LAB — CULTURE, URINE COMPREHENSIVE
MICRO NUMBER:: 10458648
RESULT:: NO GROWTH
SPECIMEN QUALITY:: ADEQUATE

## 2020-03-21 ENCOUNTER — Encounter: Payer: Self-pay | Admitting: Internal Medicine

## 2020-03-21 ENCOUNTER — Ambulatory Visit (INDEPENDENT_AMBULATORY_CARE_PROVIDER_SITE_OTHER)
Admission: RE | Admit: 2020-03-21 | Discharge: 2020-03-21 | Disposition: A | Discharge status: Home / Self Care | Payer: Medicare Other | Source: Ambulatory Visit | Attending: Internal Medicine | Admitting: Internal Medicine

## 2020-03-21 ENCOUNTER — Other Ambulatory Visit: Payer: Self-pay

## 2020-03-21 ENCOUNTER — Ambulatory Visit (INDEPENDENT_AMBULATORY_CARE_PROVIDER_SITE_OTHER): Payer: Medicare Other | Admitting: Internal Medicine

## 2020-03-21 VITALS — BP 118/78 | HR 103 | Temp 97.8°F | Wt 197.0 lb

## 2020-03-21 DIAGNOSIS — M546 Pain in thoracic spine: Secondary | ICD-10-CM | POA: Diagnosis not present

## 2020-03-21 NOTE — Progress Notes (Signed)
Subjective:    Patient ID: Michelle Vang, female    DOB: 26-Sep-1928, 86 y.o.   MRN: [id number]  HPI  Pt presents to the clinic today with c/o back pain. This started 5-6 weeks ago. She describes the pain as achy. The pain does not radiate. The pain is worse with movement and usually first thing in the morning. The pain improves throughout the day. She has gotten a new mattress. She denies recent falls or injury to the area .She has tried Tylenol OTC with minimal relief of symptoms.  Review of Systems      Past Medical History:  Diagnosis Date  . Bladder cancer ([place])   . GERD (gastroesophageal reflux disease)   . Hypertension   . Lung cancer ([place])   . Thyroid disease     Current Outpatient Medications  Medication Sig Dispense Refill  . Cholecalciferol (VITAMIN D3) 1000 UNITS CAPS Take 1 capsule by mouth daily. Reported on 11/15/2015    . levothyroxine (SYNTHROID) 88 MCG tablet TAKE 1 TAB BY MOUTH ONCE DAILY. TAKE ON AN EMPTY STOMACH WITH A GLASS OF WATER ATLEAST 30-60 MINUTES BEFORE BREAKFAST 90 tablet 0  . losartan (COZAAR) 50 MG tablet TAKE 1 TABLET BY MOUTH ONCE DAILY 90 tablet 1  . memantine (NAMENDA) 10 MG tablet Take 1 tablet twice a day 180 tablet 3  . nitrofurantoin, macrocrystal-monohydrate, (MACROBID) 100 MG capsule Take 1 capsule (100 mg total) by mouth 2 (two) times daily. (Patient not taking: Reported on 01/23/2020) 10 capsule 0  . Omega-3 Fatty Acids (FISH OIL) 1000 MG CAPS Take 1 capsule by mouth daily.     No current facility-administered medications for this visit.    Allergies  Allergen Reactions  . Aricept [Donepezil Hcl] Diarrhea    Family History  Problem Relation Age of Onset  . Stroke Mother   . Heart disease Father   . Cancer Sister        Pancreatic    Social History   Socioeconomic History  . Marital status: Widowed    Spouse name: Not on file  . Number of children: 2  . Years of education: Not on file  . Highest education level: Not on  file  Occupational History  . Occupation: Part time Scientist, water quality  Tobacco Use  . Smoking status: Never Smoker  . Smokeless tobacco: Never Used  Vaping Use  . Vaping Use: Never used  Substance and Sexual Activity  . Alcohol use: No    Alcohol/week: 0.0 standard drinks  . Drug use: No  . Sexual activity: Not Currently  Other Topics Concern  . Not on file  Social History Narrative   Right handed      Completed 12th grade      Lives alone   Social Determinants of Health   Financial Resource Strain:   . Difficulty of Paying Living Expenses:   Food Insecurity:   . Worried About Charity fundraiser in the Last Year:   . Arboriculturist in the Last Year:   Transportation Needs:   . Film/video editor (Medical):   Marland Kitchen Lack of Transportation (Non-Medical):   Physical Activity:   . Days of Exercise per Week:   . Minutes of Exercise per Session:   Stress:   . Feeling of Stress :   Social Connections:   . Frequency of Communication with Friends and Family:   . Frequency of Social Gatherings with Friends and Family:   . Attends Religious Services:   .  Active Member of Clubs or Organizations:   . Attends Archivist Meetings:   Marland Kitchen Marital Status:   Intimate Partner Violence:   . Fear of Current or Ex-Partner:   . Emotionally Abused:   Marland Kitchen Physically Abused:   . Sexually Abused:      Constitutional: Denies fever, malaise, fatigue, headache or abrupt weight changes.  Respiratory: Denies difficulty breathing, shortness of breath, cough or sputum production.   Cardiovascular: Denies chest pain, chest tightness, palpitations or swelling in the hands or feet.  Gastrointestinal: Denies abdominal pain, bloating, constipation, diarrhea or blood in the stool.  GU: Denies urgency, frequency, pain with urination, burning sensation, blood in urine, odor or discharge. Musculoskeletal: Pt reports back pain. Denies decrease in range of motion, difficulty with gait, muscle pain or joint  swelling.  Skin: Denies redness, rashes, lesions or ulcercations.   No other specific complaints in a complete review of systems (except as listed in HPI above).  Objective:   Physical Exam BP 118/78   Pulse (!) 103   Temp 97.8 F (36.6 C) (Temporal)   Wt 197 lb (89.4 kg)   SpO2 96%   BMI 31.80 kg/m   Wt Readings from Last 3 Encounters:  01/23/20 197 lb 9.6 oz (89.6 kg)  01/23/20 197 lb (89.4 kg)  06/24/19 190 lb 2 oz (86.2 kg)    General: Appears her stated age, obese, in NAD. Skin: Warm, dry and intact. No rashes noted. Cardiovascular: Tachycardic with normal rhythm. S1,S2 noted.  No murmur, rubs or gallops noted. Radial pulses 2+ bilaterally. Pulmonary/Chest: Normal effort and positive vesicular breath sounds. No respiratory distress. No wheezes, rales or ronchi noted.  Musculoskeletal: Normal flexion, extension, rotation and lateral bending of the spine. Bony tenderness noted over the thoracic spine. Strength 5/5 BUE. Shuffling gait, but steady without device. Neurological: Alert and oriented.     BMET    Component Value Date/Time   NA 139 03/28/2019 1539   NA 140 03/22/2013 0800   K 4.2 03/28/2019 1539   K 4.1 03/22/2013 0800   CL 104 03/28/2019 1539   CL 106 03/22/2013 0800   CO2 26 03/28/2019 1539   CO2 27 03/22/2013 0800   GLUCOSE 110 (H) 03/28/2019 1539   GLUCOSE 104 (H) 03/22/2013 0800   BUN 23 03/28/2019 1539   BUN 15 03/22/2013 0800   CREATININE 0.91 03/28/2019 1539   CREATININE 0.91 (H) 10/24/2016 1502   CALCIUM 9.6 03/28/2019 1539   CALCIUM 9.2 03/22/2013 0800   GFRNONAA 59 (L) 03/22/2013 0800   GFRAA >60 03/22/2013 0800    Lipid Panel     Component Value Date/Time   CHOL 194 03/28/2019 1539   TRIG 216.0 (H) 03/28/2019 1539   HDL 43.60 03/28/2019 1539   CHOLHDL 4 03/28/2019 1539   VLDL 43.2 (H) 03/28/2019 1539   LDLCALC 124 (H) 12/08/2017 1544    CBC    Component Value Date/Time   WBC 8.3 03/28/2019 1539   RBC 4.28 03/28/2019 1539    HGB 13.6 03/28/2019 1539   HGB 13.0 09/22/2012 0934   HCT 40.6 03/28/2019 1539   HCT 39.4 09/22/2012 0934   PLT 249.0 03/28/2019 1539   PLT 253 09/22/2012 0934   MCV 94.7 03/28/2019 1539   MCV 91 09/22/2012 0934   MCH 30.4 10/24/2016 1502   MCHC 33.6 03/28/2019 1539   RDW 12.5 03/28/2019 1539   RDW 13.0 09/22/2012 0934   LYMPHSABS 1.7 09/22/2012 0934   MONOABS 0.7 09/22/2012 0934  EOSABS 0.2 09/22/2012 0934   BASOSABS 0.1 09/22/2012 0934    Hgb A1C Lab Results  Component Value Date   HGBA1C 6.6 (A) 09/27/2018           Assessment & Plan:   Thoracic Back Pain:  OA vs nontraumatic compression fracture Xray thoracic spine today Could take Tylenol Arthritis 650 mg at bedtime  Will follow up after xray, return precautions discussed  Webb Silversmith, NP This visit occurred during the SARS-CoV-2 public health emergency.  Safety protocols were in place, including screening questions prior to the visit, additional usage of staff PPE, and extensive cleaning of exam room while observing appropriate contact time as indicated for disinfecting solutions.

## 2020-03-21 NOTE — Patient Instructions (Signed)
Acute Back Pain, Adult Acute back pain is sudden and usually short-lived. It is often caused by an injury to the muscles and tissues in the back. The injury may result from:  A muscle or ligament getting overstretched or torn (strained). Ligaments are tissues that connect bones to each other. Lifting something improperly can cause a back strain.  Wear and tear (degeneration) of the spinal disks. Spinal disks are circular tissue that provides cushioning between the bones of the spine (vertebrae).  Twisting motions, such as while playing sports or doing yard work.  A hit to the back.  Arthritis. You may have a physical exam, lab tests, and imaging tests to find the cause of your pain. Acute back pain usually goes away with rest and home care. Follow these instructions at home: Managing pain, stiffness, and swelling  Take over-the-counter and prescription medicines only as told by your health care provider.  Your health care provider may recommend applying ice during the first 24-48 hours after your pain starts. To do this: ? Put ice in a plastic bag. ? Place a towel between your skin and the bag. ? Leave the ice on for 20 minutes, 2-3 times a day.  If directed, apply heat to the affected area as often as told by your health care provider. Use the heat source that your health care provider recommends, such as a moist heat pack or a heating pad. ? Place a towel between your skin and the heat source. ? Leave the heat on for 20-30 minutes. ? Remove the heat if your skin turns bright red. This is especially important if you are unable to feel pain, heat, or cold. You have a greater risk of getting burned. Activity   Do not stay in bed. Staying in bed for more than 1-2 days can delay your recovery.  Sit up and stand up straight. Avoid leaning forward when you sit, or hunching over when you stand. ? If you work at a desk, sit close to it so you do not need to lean over. Keep your chin tucked  in. Keep your neck drawn back, and keep your elbows bent at a right angle. Your arms should look like the letter "L." ? Sit high and close to the steering wheel when you drive. Add lower back (lumbar) support to your car seat, if needed.  Take short walks on even surfaces as soon as you are able. Try to increase the length of time you walk each day.  Do not sit, drive, or stand in one place for more than 30 minutes at a time. Sitting or standing for long periods of time can put stress on your back.  Do not drive or use heavy machinery while taking prescription pain medicine.  Use proper lifting techniques. When you bend and lift, use positions that put less stress on your back: ? Bend your knees. ? Keep the load close to your body. ? Avoid twisting.  Exercise regularly as told by your health care provider. Exercising helps your back heal faster and helps prevent back injuries by keeping muscles strong and flexible.  Work with a physical therapist to make a safe exercise program, as recommended by your health care provider. Do any exercises as told by your physical therapist. Lifestyle  Maintain a healthy weight. Extra weight puts stress on your back and makes it difficult to have good posture.  Avoid activities or situations that make you feel anxious or stressed. Stress and anxiety increase muscle   tension and can make back pain worse. Learn ways to manage anxiety and stress, such as through exercise. General instructions  Sleep on a firm mattress in a comfortable position. Try lying on your side with your knees slightly bent. If you lie on your back, put a pillow under your knees.  Follow your treatment plan as told by your health care provider. This may include: ? Cognitive or behavioral therapy. ? Acupuncture or massage therapy. ? Meditation or yoga. Contact a health care provider if:  You have pain that is not relieved with rest or medicine.  You have increasing pain going down  into your legs or buttocks.  Your pain does not improve after 2 weeks.  You have pain at night.  You lose weight without trying.  You have a fever or chills. Get help right away if:  You develop new bowel or bladder control problems.  You have unusual weakness or numbness in your arms or legs.  You develop nausea or vomiting.  You develop abdominal pain.  You feel faint. Summary  Acute back pain is sudden and usually short-lived.  Use proper lifting techniques. When you bend and lift, use positions that put less stress on your back.  Take over-the-counter and prescription medicines and apply heat or ice as directed by your health care provider. This information is not intended to replace advice given to you by your health care provider. Make sure you discuss any questions you have with your health care provider. Document Revised: 12/21/2018 Document Reviewed: 04/15/2017 Elsevier Patient Education  2020 [employer]  

## 2020-03-23 ENCOUNTER — Telehealth: Payer: Self-pay

## 2020-03-23 NOTE — Telephone Encounter (Signed)
Patient's daughter called asking for recent xray results. I have reviewed these results with her and gave her Michelle Vang's recommendations. She verbalized understanding. Nothing further needed. Thanks.

## 2020-03-29 ENCOUNTER — Encounter: Payer: Medicare Other | Admitting: Internal Medicine

## 2020-05-08 ENCOUNTER — Other Ambulatory Visit: Payer: Self-pay | Admitting: Internal Medicine

## 2020-05-10 ENCOUNTER — Ambulatory Visit (INDEPENDENT_AMBULATORY_CARE_PROVIDER_SITE_OTHER): Payer: Medicare Other | Admitting: Internal Medicine

## 2020-05-10 ENCOUNTER — Other Ambulatory Visit: Payer: Self-pay

## 2020-05-10 ENCOUNTER — Encounter: Payer: Self-pay | Admitting: Internal Medicine

## 2020-05-10 VITALS — BP 118/74 | HR 105 | Ht 66.0 in | Wt 198.0 lb

## 2020-05-10 DIAGNOSIS — I10 Essential (primary) hypertension: Secondary | ICD-10-CM

## 2020-05-10 DIAGNOSIS — E119 Type 2 diabetes mellitus without complications: Secondary | ICD-10-CM

## 2020-05-10 DIAGNOSIS — K219 Gastro-esophageal reflux disease without esophagitis: Secondary | ICD-10-CM

## 2020-05-10 DIAGNOSIS — G3184 Mild cognitive impairment, so stated: Secondary | ICD-10-CM | POA: Diagnosis not present

## 2020-05-10 DIAGNOSIS — E039 Hypothyroidism, unspecified: Secondary | ICD-10-CM | POA: Diagnosis not present

## 2020-05-10 DIAGNOSIS — Z0000 Encounter for general adult medical examination without abnormal findings: Secondary | ICD-10-CM | POA: Diagnosis not present

## 2020-05-10 DIAGNOSIS — E559 Vitamin D deficiency, unspecified: Secondary | ICD-10-CM | POA: Diagnosis not present

## 2020-05-10 NOTE — Assessment & Plan Note (Signed)
TSH and Free T4 today Will adjust Levothyroxine if needed based on labs

## 2020-05-10 NOTE — Progress Notes (Signed)
HPI:  Pt presents to the clinic today for her subsequent annual Medicare Wellness Exam. She is also due to follow up chronic conditions.  Hypothyroidism: She denies any issues on her current dose of Levothyroxine. She does not follow with endocrinology.   HTN: Her BP today is 118/74. She is taking Losartan as prescribed. ECG from 06/2010 reviewed.  GERD: Currently not an issue. She is not taking any medication for this. There is no upper GI on file.  MCI: Persistent short term memory loss, not significantly worse. She follows with neurology.  DM 2: Her last A1C was 6.6%, 09/2018. She is not taking any diabetic medication at this time. She does not check her sugars. She checks her feet routinely.  Past Medical History:  Diagnosis Date  . Bladder cancer ([place])   . GERD (gastroesophageal reflux disease)   . Hypertension   . Lung cancer ([place])   . Thyroid disease     Current Outpatient Medications  Medication Sig Dispense Refill  . Cholecalciferol (VITAMIN D3) 1000 UNITS CAPS Take 1 capsule by mouth daily. Reported on 11/15/2015    . levothyroxine (SYNTHROID) 88 MCG tablet TAKE 1 TAB BY MOUTH ONCE DAILY. TAKE ON AN EMPTY STOMACH WITH A GLASS OF WATER ATLEAST 30-60 MINUTES BEFORE BREAKFAST 90 tablet 0  . losartan (COZAAR) 50 MG tablet TAKE 1 TABLET BY MOUTH ONCE DAILY 90 tablet 0  . memantine (NAMENDA) 10 MG tablet Take 1 tablet twice a day 180 tablet 3  . Omega-3 Fatty Acids (FISH OIL) 1000 MG CAPS Take 1 capsule by mouth daily.     No current facility-administered medications for this visit.    Allergies  Allergen Reactions  . Aricept [Donepezil Hcl] Diarrhea    Family History  Problem Relation Age of Onset  . Stroke Mother   . Heart disease Father   . Cancer Sister        Pancreatic    Social History   Socioeconomic History  . Marital status: Widowed    Spouse name: Not on file  . Number of children: 2  . Years of education: Not on file  . Highest education level: Not  on file  Occupational History  . Occupation: Part time Scientist, water quality  Tobacco Use  . Smoking status: Never Smoker  . Smokeless tobacco: Never Used  Vaping Use  . Vaping Use: Never used  Substance and Sexual Activity  . Alcohol use: No    Alcohol/week: 0.0 standard drinks  . Drug use: No  . Sexual activity: Not Currently  Other Topics Concern  . Not on file  Social History Narrative   Right handed      Completed 12th grade      Lives alone   Social Determinants of Health   Financial Resource Strain:   . Difficulty of Paying Living Expenses: Not on file  Food Insecurity:   . Worried About Charity fundraiser in the Last Year: Not on file  . Ran Out of Food in the Last Year: Not on file  Transportation Needs:   . Lack of Transportation (Medical): Not on file  . Lack of Transportation (Non-Medical): Not on file  Physical Activity:   . Days of Exercise per Week: Not on file  . Minutes of Exercise per Session: Not on file  Stress:   . Feeling of Stress : Not on file  Social Connections:   . Frequency of Communication with Friends and Family: Not on file  . Frequency of Social  Gatherings with Friends and Family: Not on file  . Attends Religious Services: Not on file  . Active Member of Clubs or Organizations: Not on file  . Attends Archivist Meetings: Not on file  . Marital Status: Not on file  Intimate Partner Violence:   . Fear of Current or Ex-Partner: Not on file  . Emotionally Abused: Not on file  . Physically Abused: Not on file  . Sexually Abused: Not on file    Hospitiliaztions: 08/2019 ER visit UTI  Health Maintenance:    Flu: 05/2019  Tetanus: > 10 years ago  Pneumovax: 11/2017  Prevnar: 10/2016  Zostavax: had it, date unkown  Shingrix: never  Covid: Pfizer  Mammogram: no longer screening  Pap Smear: no longer screeing  Bone Density: no longer screening  Colon Screening: no longer screening  Eye Doctor: annually  Dental Exam:  biannually   Providers:   PCP: Webb Silversmith, NP   Neurology: Dr. Delice Lesch   I have personally reviewed and have noted:  1. The patient's medical and social history 2. Their use of alcohol, tobacco or illicit drugs 3. Their current medications and supplements 4. The patient's functional ability including ADL's, fall risks, home safety risks and hearing or visual impairment. 5. Diet and physical activities 6. Evidence for depression or mood disorder  Subjective:   Review of Systems:   Constitutional: Pt reports weight gain. Denies fever, malaise, fatigue, headache.  HEENT: Pt reports difficulty hearing. Denies eye pain, eye redness, ear pain, ringing in the ears, wax buildup, runny nose, nasal congestion, bloody nose, or sore throat. Respiratory: Denies difficulty breathing, shortness of breath, cough or sputum production.   Cardiovascular: Denies chest pain, chest tightness, palpitations or swelling in the hands or feet.  Gastrointestinal: Denies abdominal pain, bloating, constipation, diarrhea or blood in the stool.  GU: Denies urgency, frequency, pain with urination, burning sensation, blood in urine, odor or discharge. Musculoskeletal: Denies decrease in range of motion, difficulty with gait, muscle pain or joint pain and swelling.  Skin: Denies redness, rashes, lesions or ulcercations.  Neurological: Pt reports difficulty with memory. Denies dizziness,  difficulty with speech or problems with balance and coordination.  Psych: Denies anxiety, depression, SI/HI.  No other specific complaints in a complete review of systems (except as listed in HPI above).  Objective:  PE:   BP 118/74   Pulse (!) 105   Ht 5\' 6"  (1.676 m)   Wt 198 lb (89.8 kg)   SpO2 95%   BMI 31.96 kg/m  Wt Readings from Last 3 Encounters:  05/10/20 198 lb (89.8 kg)  03/21/20 197 lb (89.4 kg)  01/23/20 197 lb 9.6 oz (89.6 kg)    General: Appears her stated age, obese, in NAD. Skin: Warm, dry and  intact. No ulcerations noted. HEENT: Head: normal shape and size; Eyes: sclera white, no icterus, conjunctiva pink, PERRLA and EOMs intact;  Neck: Neck supple, trachea midline. No masses, lumps present.  Cardiovascular: Normal rate and rhythm. S1,S2 noted.  No murmur, rubs or gallops noted. No JVD or BLE edema. No carotid bruits noted. Pulmonary/Chest: Normal effort and positive vesicular breath sounds. No respiratory distress. No wheezes, rales or ronchi noted.  Abdomen: Soft and nontender. Normal bowel sounds. No distention or masses noted. Liver, spleen and kidneys non palpable. Musculoskeletal: Difficulty getting from a sitting to standing position. Strength 5/5 BUE/BLE. No signs of joint swelling.  Neurological: Alert and oriented. Cranial nerves II-XII grossly intact. Coordination normal.  Psychiatric: Mood and  affect normal. Behavior is normal. Judgment and thought content normal.    BMET    Component Value Date/Time   NA 139 03/28/2019 1539   NA 140 03/22/2013 0800   K 4.2 03/28/2019 1539   K 4.1 03/22/2013 0800   CL 104 03/28/2019 1539   CL 106 03/22/2013 0800   CO2 26 03/28/2019 1539   CO2 27 03/22/2013 0800   GLUCOSE 110 (H) 03/28/2019 1539   GLUCOSE 104 (H) 03/22/2013 0800   BUN 23 03/28/2019 1539   BUN 15 03/22/2013 0800   CREATININE 0.91 03/28/2019 1539   CREATININE 0.91 (H) 10/24/2016 1502   CALCIUM 9.6 03/28/2019 1539   CALCIUM 9.2 03/22/2013 0800   GFRNONAA 59 (L) 03/22/2013 0800   GFRAA >60 03/22/2013 0800    Lipid Panel     Component Value Date/Time   CHOL 194 03/28/2019 1539   TRIG 216.0 (H) 03/28/2019 1539   HDL 43.60 03/28/2019 1539   CHOLHDL 4 03/28/2019 1539   VLDL 43.2 (H) 03/28/2019 1539   LDLCALC 124 (H) 12/08/2017 1544    CBC    Component Value Date/Time   WBC 8.3 03/28/2019 1539   RBC 4.28 03/28/2019 1539   HGB 13.6 03/28/2019 1539   HGB 13.0 09/22/2012 0934   HCT 40.6 03/28/2019 1539   HCT 39.4 09/22/2012 0934   PLT 249.0 03/28/2019  1539   PLT 253 09/22/2012 0934   MCV 94.7 03/28/2019 1539   MCV 91 09/22/2012 0934   MCH 30.4 10/24/2016 1502   MCHC 33.6 03/28/2019 1539   RDW 12.5 03/28/2019 1539   RDW 13.0 09/22/2012 0934   LYMPHSABS 1.7 09/22/2012 0934   MONOABS 0.7 09/22/2012 0934   EOSABS 0.2 09/22/2012 0934   BASOSABS 0.1 09/22/2012 0934    Hgb A1C Lab Results  Component Value Date   HGBA1C 6.6 (A) 09/27/2018      Assessment and Plan:   Medicare Annual Wellness Visit:  Diet: She does eat meat. Sheconsumes some fruits and veggies. She does eat fried foods. She drinks moslty water, coffee, pepsi Physical activity: Sedentary Depression/mood screen: Negative, PHQ 9 score of 0 Hearing: Intact to whispered voice Visual acuity: Grossly normal, performs annual eye exam  ADLs: Capable Fall risk: None Home safety: Good Cognitive evaluation: Trouble with recall. Intact to orientation, naming, and repetition EOL planning: Adv directives, full code/ I agree  Preventative Medicine: Encouraged her to get a flu shot in the fall. Advised her if she gets bitten or cut she will need to go get a tetanus vaccine. Pneumovax, prevnar, covid vaccine UTD. She has had zostovax, will hold off on Shingrix. She no longer wants mammograms, pap smears, bone density or colon cancer screening. Encouraged her to consume a balanced diet and exercise regimen. Advised her to see an eye doctor and dentist annually. Will check CBC, CMET, TSH, Free T4, Lipid, A1C and Vit D today. Due dates for screening exam given to patient as part of her AVS.   Next appointment: 1 year, Medicare Wellness Exam   Webb Silversmith, NP  This visit occurred during the SARS-CoV-2 public health emergency.  Safety protocols were in place, including screening questions prior to the visit, additional usage of staff PPE, and extensive cleaning of exam room while observing appropriate contact time as indicated for disinfecting solutions.

## 2020-05-10 NOTE — Assessment & Plan Note (Signed)
Continue Losartan CMET today Will monitor

## 2020-05-10 NOTE — Assessment & Plan Note (Signed)
A1C today No urine microalbumin secondary to ARB therapy Encouraged her to consume a low carb diet Encouraged yearly eye exam Encouraged her to check her feet  Immunizations UTD

## 2020-05-10 NOTE — Patient Instructions (Signed)

## 2020-05-10 NOTE — Assessment & Plan Note (Signed)
Will monitor

## 2020-05-10 NOTE — Assessment & Plan Note (Signed)
Continue Namenda She will continue to follow with neurology

## 2020-05-11 LAB — CBC
HCT: 42.7 % (ref 36.0–46.0)
Hemoglobin: 14.5 g/dL (ref 12.0–15.0)
MCHC: 34 g/dL (ref 30.0–36.0)
MCV: 96.9 fl (ref 78.0–100.0)
Platelets: 235 10*3/uL (ref 150.0–400.0)
RBC: 4.41 Mil/uL (ref 3.87–5.11)
RDW: 13.1 % (ref 11.5–15.5)
WBC: 8.3 10*3/uL (ref 4.0–10.5)

## 2020-05-11 LAB — TSH: TSH: 2.47 u[IU]/mL (ref 0.35–4.50)

## 2020-05-11 LAB — COMPREHENSIVE METABOLIC PANEL
ALT: 30 U/L (ref 0–35)
AST: 32 U/L (ref 0–37)
Albumin: 3.8 g/dL (ref 3.5–5.2)
Alkaline Phosphatase: 125 U/L — ABNORMAL HIGH (ref 39–117)
BUN: 30 mg/dL — ABNORMAL HIGH (ref 6–23)
CO2: 27 mEq/L (ref 19–32)
Calcium: 9.8 mg/dL (ref 8.4–10.5)
Chloride: 102 mEq/L (ref 96–112)
Creatinine, Ser: 1.27 mg/dL — ABNORMAL HIGH (ref 0.40–1.20)
GFR: 39.57 mL/min — ABNORMAL LOW (ref >60.00)
Glucose, Bld: 264 mg/dL — ABNORMAL HIGH (ref 70–99)
Potassium: 5.3 mEq/L — ABNORMAL HIGH (ref 3.5–5.1)
Sodium: 137 mEq/L (ref 135–145)
Total Bilirubin: 0.7 mg/dL (ref 0.2–1.2)
Total Protein: 7.1 g/dL (ref 6.0–8.3)

## 2020-05-11 LAB — HEMOGLOBIN A1C: Hgb A1c MFr Bld: 8.9 % — ABNORMAL HIGH (ref 4.6–6.5)

## 2020-05-11 LAB — LIPID PANEL
Cholesterol: 235 mg/dL — ABNORMAL HIGH (ref 0–200)
HDL: 38.9 mg/dL — ABNORMAL LOW (ref >39.00)
Total CHOL/HDL Ratio: 6
Triglycerides: 402 mg/dL — ABNORMAL HIGH (ref 0.0–149.0)

## 2020-05-11 LAB — VITAMIN D 25 HYDROXY (VIT D DEFICIENCY, FRACTURES): VITD: 29.52 ng/mL — ABNORMAL LOW (ref 30.00–100.00)

## 2020-05-11 LAB — LDL CHOLESTEROL, DIRECT: Direct LDL: 143 mg/dL

## 2020-05-17 MED ORDER — ATORVASTATIN CALCIUM 10 MG PO TABS: 10.0000 mg | ORAL_TABLET | Freq: Every day | ORAL | 2 refills | Status: DC

## 2020-05-17 MED ORDER — GLIPIZIDE ER 2.5 MG PO TB24: 2.5000 mg | ORAL_TABLET | Freq: Every day | ORAL | 2 refills | Status: DC

## 2020-05-17 NOTE — Addendum Note (Signed)
Addended by: Jearld Fenton on: 05/17/2020 08:06 AM   Modules accepted: Orders

## 2020-07-06 ENCOUNTER — Other Ambulatory Visit: Payer: Self-pay | Admitting: Internal Medicine

## 2020-08-07 ENCOUNTER — Ambulatory Visit: Payer: Medicare Other | Admitting: Neurology

## 2020-08-08 ENCOUNTER — Other Ambulatory Visit: Payer: Self-pay | Admitting: Internal Medicine

## 2020-08-16 ENCOUNTER — Other Ambulatory Visit: Payer: Self-pay

## 2020-08-16 ENCOUNTER — Ambulatory Visit (INDEPENDENT_AMBULATORY_CARE_PROVIDER_SITE_OTHER): Payer: Medicare Other | Admitting: Internal Medicine

## 2020-08-16 ENCOUNTER — Encounter: Payer: Self-pay | Admitting: Internal Medicine

## 2020-08-16 DIAGNOSIS — E119 Type 2 diabetes mellitus without complications: Secondary | ICD-10-CM

## 2020-08-16 DIAGNOSIS — E039 Hypothyroidism, unspecified: Secondary | ICD-10-CM

## 2020-08-16 DIAGNOSIS — G3184 Mild cognitive impairment, so stated: Secondary | ICD-10-CM

## 2020-08-16 DIAGNOSIS — K219 Gastro-esophageal reflux disease without esophagitis: Secondary | ICD-10-CM

## 2020-08-16 DIAGNOSIS — E7800 Pure hypercholesterolemia, unspecified: Secondary | ICD-10-CM

## 2020-08-16 DIAGNOSIS — I10 Essential (primary) hypertension: Secondary | ICD-10-CM

## 2020-08-16 DIAGNOSIS — E785 Hyperlipidemia, unspecified: Secondary | ICD-10-CM | POA: Insufficient documentation

## 2020-08-16 LAB — LIPID PANEL
Cholesterol: 144 mg/dL (ref 0–200)
HDL: 41.2 mg/dL (ref >39.00)
LDL Cholesterol: 63 mg/dL (ref 0–99)
NonHDL: 102.32
Total CHOL/HDL Ratio: 3
Triglycerides: 196 mg/dL — ABNORMAL HIGH (ref 0.0–149.0)
VLDL: 39.2 mg/dL (ref 0.0–40.0)

## 2020-08-16 LAB — COMPREHENSIVE METABOLIC PANEL
ALT: 25 U/L (ref 0–35)
AST: 29 U/L (ref 0–37)
Albumin: 3.6 g/dL (ref 3.5–5.2)
Alkaline Phosphatase: 99 U/L (ref 39–117)
BUN: 36 mg/dL — ABNORMAL HIGH (ref 6–23)
CO2: 25 mEq/L (ref 19–32)
Calcium: 9.4 mg/dL (ref 8.4–10.5)
Chloride: 101 mEq/L (ref 96–112)
Creatinine, Ser: 1.45 mg/dL — ABNORMAL HIGH (ref 0.40–1.20)
GFR: 31.92 mL/min — ABNORMAL LOW (ref >60.00)
Glucose, Bld: 265 mg/dL — ABNORMAL HIGH (ref 70–99)
Potassium: 4.9 mEq/L (ref 3.5–5.1)
Sodium: 136 mEq/L (ref 135–145)
Total Bilirubin: 1 mg/dL (ref 0.2–1.2)
Total Protein: 7 g/dL (ref 6.0–8.3)

## 2020-08-16 LAB — HEMOGLOBIN A1C: Hgb A1c MFr Bld: 8.2 % — ABNORMAL HIGH (ref 4.6–6.5)

## 2020-08-16 NOTE — Assessment & Plan Note (Signed)
Will decrease Losartan to 25 mg daily Will monitor CMET today

## 2020-08-16 NOTE — Assessment & Plan Note (Signed)
CMET and lipid profile today Encouraged her to consume a low fat diet Continue Atorvastatin

## 2020-08-16 NOTE — Addendum Note (Signed)
Addended by: Jearld Fenton on: 08/16/2020 12:23 PM   Modules accepted: Orders

## 2020-08-16 NOTE — Assessment & Plan Note (Signed)
Continue Namenda She will continue to follow with neurology

## 2020-08-16 NOTE — Patient Instructions (Signed)

## 2020-08-16 NOTE — Assessment & Plan Note (Signed)
CMET, A1C and lipid today Encouraged her to consume a low carb, low fat diet Continue Glipizide for now Encouraged routine foot exams Encouraged routine eye exams Immunizations UTD

## 2020-08-16 NOTE — Progress Notes (Signed)
Subjective:    Patient ID: Michelle Vang, female    DOB: Feb 08, 1929, 86 y.o.   MRN: [id number]  HPI  Pt presents to the clinic today for 3 month follow up of chronic conditions.  Hypothyroidism: She denies any issues on her current dose of Levothyroxine. She does not follow with endocrinology.  HTN: Her BP today is 102/62. She is taking Losartan as prescribed. ECG from 06/2010 reviewed.  GERD: Currently not an issues off meds. There is no upper GI on file.  MCI: Persistent short term memory loss. She is taking Namenda as prescribed. She follows with neurology.  DM 2: Her last A1C was 8.9%, 04/2020. She was started on Glipizide and has been taking the medication as prescribed. She does not check her sugars. She checks her feet routinely. Her last eye exam was. Flu 05/2020. Pneumovax 11/2017. Prevnar 10/2016. [employer].  HLD: Her last LDL was 143, 04/2020. She denies myalgias on Atorvastatin. She tries to consume a low fat diet.   Review of Systems  Past Medical History:  Diagnosis Date  . Bladder cancer ([place])   . GERD (gastroesophageal reflux disease)   . Hypertension   . Lung cancer ([place])   . Thyroid disease     Current Outpatient Medications  Medication Sig Dispense Refill  . atorvastatin (LIPITOR) 10 MG tablet TAKE 1 TABLET BY MOUTH ONCE A DAY 30 tablet 0  . Cholecalciferol (VITAMIN D3) 1000 UNITS CAPS Take 1 capsule by mouth daily. Reported on 11/15/2015    . glipiZIDE (GLUCOTROL XL) 2.5 MG 24 hr tablet Take 1 tablet (2.5 mg total) by mouth daily with breakfast. 30 tablet 2  . levothyroxine (SYNTHROID) 88 MCG tablet TAKE 1 TAB BY MOUTH ONCE DAILY. TAKE ON AN EMPTY STOMACH WITH A GLASS OF WATER ATLEAST 30-60 MINUTES BEFORE BREAKFAST 90 tablet 0  . losartan (COZAAR) 50 MG tablet TAKE 1 TABLET BY MOUTH ONCE DAILY 90 tablet 0  . memantine (NAMENDA) 10 MG tablet Take 1 tablet twice a day 180 tablet 3  . Omega-3 Fatty Acids (FISH OIL) 1000 MG CAPS Take 1 capsule by mouth daily.       No current facility-administered medications for this visit.    Allergies  Allergen Reactions  . Aricept [Donepezil Hcl] Diarrhea    Family History  Problem Relation Age of Onset  . Stroke Mother   . Heart disease Father   . Cancer Sister        Pancreatic    Social History   Socioeconomic History  . Marital status: Widowed    Spouse name: Not on file  . Number of children: 2  . Years of education: Not on file  . Highest education level: Not on file  Occupational History  . Occupation: Part time Scientist, water quality  Tobacco Use  . Smoking status: Never Smoker  . Smokeless tobacco: Never Used  Vaping Use  . Vaping Use: Never used  Substance and Sexual Activity  . Alcohol use: No    Alcohol/week: 0.0 standard drinks  . Drug use: No  . Sexual activity: Not Currently  Other Topics Concern  . Not on file  Social History Narrative   Right handed      Completed 12th grade      Lives alone   Social Determinants of Health   Financial Resource Strain:   . Difficulty of Paying Living Expenses: Not on file  Food Insecurity:   . Worried About Charity fundraiser in the Last  Year: Not on file  . Ran Out of Food in the Last Year: Not on file  Transportation Needs:   . Lack of Transportation (Medical): Not on file  . Lack of Transportation (Non-Medical): Not on file  Physical Activity:   . Days of Exercise per Week: Not on file  . Minutes of Exercise per Session: Not on file  Stress:   . Feeling of Stress : Not on file  Social Connections:   . Frequency of Communication with Friends and Family: Not on file  . Frequency of Social Gatherings with Friends and Family: Not on file  . Attends Religious Services: Not on file  . Active Member of Clubs or Organizations: Not on file  . Attends Archivist Meetings: Not on file  . Marital Status: Not on file  Intimate Partner Violence:   . Fear of Current or Ex-Partner: Not on file  . Emotionally Abused: Not on file  .  Physically Abused: Not on file  . Sexually Abused: Not on file     Constitutional: Denies fever, malaise, fatigue, headache or abrupt weight changes.  Respiratory: Denies difficulty breathing, shortness of breath, cough or sputum production.   Cardiovascular: Denies chest pain, chest tightness, palpitations or swelling in the hands or feet.  Gastrointestinal: Denies abdominal pain, bloating, constipation, diarrhea or blood in the stool.  GU: Denies urgency, frequency, pain with urination, burning sensation, blood in urine, odor or discharge. Musculoskeletal: Pt reports intermittent weakness. Denies decrease in range of motion, difficulty with gait, muscle pain or joint pain and swelling.  Skin: Denies redness, rashes, lesions or ulcercations.  Neurological: Pt reports difficulty with memory, dizziness. Denies difficulty with speech or problems with balance and coordination.    No other specific complaints in a complete review of systems (except as listed in HPI above).     Objective:   Physical Exam  BP 102/62   Pulse (!) 105   Temp (!) 96.9 F (36.1 C) (Temporal)   Wt 201 lb (91.2 kg)   SpO2 97%   BMI 32.44 kg/m   Wt Readings from Last 3 Encounters:  05/10/20 198 lb (89.8 kg)  03/21/20 197 lb (89.4 kg)  01/23/20 197 lb 9.6 oz (89.6 kg)    General: Appears her stated age, obese, in NAD. Skin: Warm, dry and intact. No ulcerations noted. HEENT: Head: normal shape and size; Eyes: sclera white, no icterus, conjunctiva pink, PERRLA and EOMs intact;  Cardiovascular: Normal rate and rhythm. S1,S2 noted.  No murmur, rubs or gallops noted. No JVD or BLE edema. No carotid bruits noted. Pulmonary/Chest: Normal effort and positive vesicular breath sounds. No respiratory distress. No wheezes, rales or ronchi noted.  Neurological: Alert and oriented. [facility]   BMET    Component Value Date/Time   NA 137 05/10/2020 1542   NA 140 03/22/2013 0800   K 5.3 (H) 05/10/2020 1542   K 4.1  03/22/2013 0800   CL 102 05/10/2020 1542   CL 106 03/22/2013 0800   CO2 27 05/10/2020 1542   CO2 27 03/22/2013 0800   GLUCOSE 264 (H) 05/10/2020 1542   GLUCOSE 104 (H) 03/22/2013 0800   BUN 30 (H) 05/10/2020 1542   BUN 15 03/22/2013 0800   CREATININE 1.27 (H) 05/10/2020 1542   CREATININE 0.91 (H) 10/24/2016 1502   CALCIUM 9.8 05/10/2020 1542   CALCIUM 9.2 03/22/2013 0800   GFRNONAA 59 (L) 03/22/2013 0800   GFRAA >60 03/22/2013 0800    Lipid Panel  Component Value Date/Time   CHOL 235 (H) 05/10/2020 1542   TRIG (H) 05/10/2020 1542    402.0 Triglyceride is over 400; calculations on Lipids are invalid.   HDL 38.90 (L) 05/10/2020 1542   CHOLHDL 6 05/10/2020 1542   VLDL 43.2 (H) 03/28/2019 1539   LDLCALC 124 (H) 12/08/2017 1544    CBC    Component Value Date/Time   WBC 8.3 05/10/2020 1542   RBC 4.41 05/10/2020 1542   HGB 14.5 05/10/2020 1542   HGB 13.0 09/22/2012 0934   HCT 42.7 05/10/2020 1542   HCT 39.4 09/22/2012 0934   PLT 235.0 05/10/2020 1542   PLT 253 09/22/2012 0934   MCV 96.9 05/10/2020 1542   MCV 91 09/22/2012 0934   MCH 30.4 10/24/2016 1502   MCHC 34.0 05/10/2020 1542   RDW 13.1 05/10/2020 1542   RDW 13.0 09/22/2012 0934   LYMPHSABS 1.7 09/22/2012 0934   MONOABS 0.7 09/22/2012 0934   EOSABS 0.2 09/22/2012 0934   BASOSABS 0.1 09/22/2012 0934    Hgb A1C Lab Results  Component Value Date   HGBA1C 8.9 (H) 05/10/2020           Assessment & Plan:    Webb Silversmith, NP This visit occurred during the SARS-CoV-2 public health emergency.  Safety protocols were in place, including screening questions prior to the visit, additional usage of staff PPE, and extensive cleaning of exam room while observing appropriate contact time as indicated for disinfecting solutions.

## 2020-08-16 NOTE — Assessment & Plan Note (Signed)
Will check TSH and Free T4 in about 3 months Continue Levothyroxine

## 2020-08-16 NOTE — Assessment & Plan Note (Signed)
Currently not an issue Will monitor 

## 2020-08-17 NOTE — Progress Notes (Signed)
Kidney function worsening, liver function normal. Cholesterol looks goo. A1C down to 8.2%, reasonable given her age. We could increase Glipizide, but I'm concerned this may cause worsening dizziness. Lets plan to repeat A1C in 3 months, lab only.

## 2020-08-17 NOTE — Addendum Note (Signed)
Addended by: Jearld Fenton on: 08/17/2020 01:48 PM   Modules accepted: Orders

## 2020-08-20 MED ORDER — GLIPIZIDE ER 2.5 MG PO TB24: 2.5000 mg | ORAL_TABLET | Freq: Every day | ORAL | 0 refills | Status: DC

## 2020-08-20 MED ORDER — ATORVASTATIN CALCIUM 10 MG PO TABS: 10.0000 mg | ORAL_TABLET | Freq: Every day | ORAL | 0 refills | Status: DC

## 2020-08-20 NOTE — Addendum Note (Signed)
Addended by: Lurlean Nanny on: 08/20/2020 12:31 PM   Modules accepted: Orders

## 2020-11-05 ENCOUNTER — Other Ambulatory Visit: Payer: Self-pay | Admitting: Internal Medicine

## 2020-11-15 ENCOUNTER — Other Ambulatory Visit (INDEPENDENT_AMBULATORY_CARE_PROVIDER_SITE_OTHER): Payer: Medicare Other

## 2020-11-15 ENCOUNTER — Other Ambulatory Visit: Payer: Self-pay

## 2020-11-15 ENCOUNTER — Other Ambulatory Visit: Payer: Self-pay | Admitting: Internal Medicine

## 2020-11-15 DIAGNOSIS — E119 Type 2 diabetes mellitus without complications: Secondary | ICD-10-CM | POA: Diagnosis not present

## 2020-11-15 LAB — POCT GLYCOSYLATED HEMOGLOBIN (HGB A1C): Hemoglobin A1C: 8.7 % — AB (ref 4.0–5.6)

## 2020-11-22 ENCOUNTER — Other Ambulatory Visit: Payer: Self-pay | Admitting: Internal Medicine

## 2020-11-22 MED ORDER — GLIPIZIDE ER 5 MG PO TB24: 5.0000 mg | ORAL_TABLET | Freq: Every day | ORAL | 1 refills | Status: DC

## 2020-11-22 NOTE — Addendum Note (Signed)
Addended by: Lurlean Nanny on: 11/22/2020 05:06 PM   Modules accepted: Orders

## 2021-01-31 ENCOUNTER — Other Ambulatory Visit: Payer: Self-pay | Admitting: Internal Medicine

## 2021-02-05 ENCOUNTER — Ambulatory Visit (INDEPENDENT_AMBULATORY_CARE_PROVIDER_SITE_OTHER): Payer: Medicare Other | Admitting: Internal Medicine

## 2021-02-05 ENCOUNTER — Other Ambulatory Visit: Payer: Self-pay

## 2021-02-05 ENCOUNTER — Encounter: Payer: Self-pay | Admitting: Internal Medicine

## 2021-02-05 VITALS — BP 86/56 | HR 115 | Temp 97.7°F | Resp 18 | Ht 66.0 in | Wt 198.0 lb

## 2021-02-05 DIAGNOSIS — I951 Orthostatic hypotension: Secondary | ICD-10-CM | POA: Diagnosis not present

## 2021-02-05 DIAGNOSIS — I10 Essential (primary) hypertension: Secondary | ICD-10-CM

## 2021-02-05 DIAGNOSIS — G3184 Mild cognitive impairment, so stated: Secondary | ICD-10-CM | POA: Diagnosis not present

## 2021-02-05 DIAGNOSIS — E119 Type 2 diabetes mellitus without complications: Secondary | ICD-10-CM | POA: Diagnosis not present

## 2021-02-05 DIAGNOSIS — R0600 Dyspnea, unspecified: Secondary | ICD-10-CM | POA: Diagnosis not present

## 2021-02-05 DIAGNOSIS — R0609 Other forms of dyspnea: Secondary | ICD-10-CM

## 2021-02-05 DIAGNOSIS — I4891 Unspecified atrial fibrillation: Secondary | ICD-10-CM

## 2021-02-05 DIAGNOSIS — R42 Dizziness and giddiness: Secondary | ICD-10-CM

## 2021-02-05 DIAGNOSIS — R531 Weakness: Secondary | ICD-10-CM

## 2021-02-05 DIAGNOSIS — E039 Hypothyroidism, unspecified: Secondary | ICD-10-CM

## 2021-02-05 DIAGNOSIS — E7800 Pure hypercholesterolemia, unspecified: Secondary | ICD-10-CM | POA: Diagnosis not present

## 2021-02-05 DIAGNOSIS — R5381 Other malaise: Secondary | ICD-10-CM

## 2021-02-05 DIAGNOSIS — R5383 Other fatigue: Secondary | ICD-10-CM | POA: Diagnosis not present

## 2021-02-05 DIAGNOSIS — K219 Gastro-esophageal reflux disease without esophagitis: Secondary | ICD-10-CM

## 2021-02-05 DIAGNOSIS — I499 Cardiac arrhythmia, unspecified: Secondary | ICD-10-CM

## 2021-02-05 LAB — POCT GLYCOSYLATED HEMOGLOBIN (HGB A1C): Hemoglobin A1C: 9.4 % — AB (ref 4.0–5.6)

## 2021-02-05 MED ORDER — METOPROLOL SUCCINATE ER 25 MG PO TB24: 25.0000 mg | ORAL_TABLET | Freq: Every day | ORAL | 0 refills | Status: DC

## 2021-02-05 MED ORDER — APIXABAN 2.5 MG PO TABS: 2.5000 mg | ORAL_TABLET | Freq: Two times a day (BID) | ORAL | 0 refills | Status: DC

## 2021-02-05 MED ORDER — GLIPIZIDE ER 5 MG PO TB24: 10.0000 mg | ORAL_TABLET | Freq: Every day | ORAL | 0 refills | Status: DC

## 2021-02-05 NOTE — Patient Instructions (Signed)

## 2021-02-05 NOTE — Telephone Encounter (Signed)
Patient daughter called in to inquire of Webb Silversmith about the request for a refill on the levothyroxine (SYNTHROID) 88 MCG tablet would like this Rx called in to the Pharmacy please. Have been trying for a few days to get this medication

## 2021-02-05 NOTE — Progress Notes (Signed)
Subjective:    Patient ID: Michelle Vang, female    DOB: [birth year], 87 y.o.   MRN: [id number]  HPI  Patient presents the clinic today for follow-up of chronic conditions.  Hypothyroidism.  She has been feeling more fatigued lately.  She is taking Levothyroxine as prescribed.  She does not follow with endocrinology.  HTN: Her BP today is 102/64.  She is not Losartan as prescribed due to low blood pressures.  ECG from 06/2010 reviewed.  GERD: She reports this is not currently an issue.  She is not taking any medications for this.  There is no upper GI on file.  MCI: Persistent short-term memory loss.  She is taking Memantine as prescribed.  She follows with neurology.  DM2: Her last A1c was 8.7%, 11/2020.  She is taking Glipizide as prescribed.  She does not check her sugars.  She checks her feet routinely.  Flu 05/2020.  Pneumovax 11/2017.  Prevnar 2 12/2016.  COVID-[employer] x2.  HLD: Her last LDL was 63, triglycerides 196, 08/2020.  She denies myalgias on Atorvastatin.  She tries to consume a low-fat diet.  Her daughter has noted a decline in her status over the last week.  She is more fatigued sleeping more throughout the day.  She reports she overall just does not feel well.  She has had intermittent dizziness and shortness of breath with exertion but denies chest pain.  She has had a few falls over the last few months without injury.  She does live alone.  Review of Systems      Past Medical History:  Diagnosis Date  . Bladder cancer ([place])   . GERD (gastroesophageal reflux disease)   . Hypertension   . Lung cancer ([place])   . Thyroid disease     Current Outpatient Medications  Medication Sig Dispense Refill  . atorvastatin (LIPITOR) 10 MG tablet TAKE 1 TABLET BY MOUTH ONCE A DAY 90 tablet 1  . Cholecalciferol (VITAMIN D3) 1000 UNITS CAPS Take 1 capsule by mouth daily. Reported on 11/15/2015    . glipiZIDE (GLUCOTROL XL) 5 MG 24 hr tablet Take 1 tablet (5 mg total) by mouth daily  with breakfast. 90 tablet 1  . levothyroxine (SYNTHROID) 88 MCG tablet TAKE 1 TAB BY MOUTH ONCE DAILY. TAKE ON AN EMPTY STOMACH WITH A GLASS OF WATER ATLEAST 30-60 MINUTES BEFORE BREAKFAST 90 tablet 0  . losartan (COZAAR) 50 MG tablet TAKE 1 TABLET BY MOUTH ONCE DAILY 90 tablet 0  . memantine (NAMENDA) 10 MG tablet Take 1 tablet twice a day 180 tablet 3  . Omega-3 Fatty Acids (FISH OIL) 1000 MG CAPS Take 1 capsule by mouth daily.     No current facility-administered medications for this visit.    Allergies  Allergen Reactions  . Aricept [Donepezil Hcl] Diarrhea    Family History  Problem Relation Age of Onset  . Stroke Mother   . Heart disease Father   . Cancer Sister        Pancreatic    Social History   Socioeconomic History  . Marital status: Widowed    Spouse name: Not on file  . Number of children: 2  . Years of education: Not on file  . Highest education level: Not on file  Occupational History  . Occupation: Part time Scientist, water quality  Tobacco Use  . Smoking status: Never Smoker  . Smokeless tobacco: Never Used  Vaping Use  . Vaping Use: Never used  Substance and Sexual Activity  .  Alcohol use: No    Alcohol/week: 0.0 standard drinks  . Drug use: No  . Sexual activity: Not Currently  Other Topics Concern  . Not on file  Social History Narrative   Right handed      Completed 12th grade      Lives alone   Social Determinants of Health   Financial Resource Strain: Not on file  Food Insecurity: Not on file  Transportation Needs: Not on file  Physical Activity: Not on file  Stress: Not on file  Social Connections: Not on file  Intimate Partner Violence: Not on file     Constitutional: Pt reports fatigue, malaise. Denies fever, headache or abrupt weight changes.  HEENT: Patient is hard of hearing.  Denies eye pain, eye redness, ear pain, ringing in the ears, wax buildup, runny nose, nasal congestion, bloody nose, or sore throat. Respiratory: Patient reports  shortness of breath with exertion.  Denies difficulty breathing, cough or sputum production.   Cardiovascular: Denies chest pain, chest tightness, palpitations or swelling in the hands or feet.  Gastrointestinal: Patient reports poor appetite.  Denies abdominal pain, bloating, constipation, diarrhea or blood in the stool.  GU: Denies urgency, frequency, pain with urination, burning sensation, blood in urine, odor or discharge. Musculoskeletal: Patient reports generalized weakness.  Denies decrease in range of motion, difficulty with gait, muscle pain or joint pain and swelling.  Skin: Denies redness, rashes, lesions or ulcercations.  Neurological: Pt report difficulty with memory, intermittent dizziness. Denies difficulty with speech or problems with balance and coordination.  Psych: Denies anxiety, depression, SI/HI.  No other specific complaints in a complete review of systems (except as listed in HPI above).  Objective:   Physical Exam  BP (!) 86/56 (BP Location: Right Arm, Patient Position: Standing, Cuff Size: Normal)   Pulse (!) 115   Temp 97.7 F (36.5 C) (Temporal)   Resp 18   Ht 5\' 6"  (1.676 m)   Wt 198 lb (89.8 kg)   SpO2 100%   BMI 31.96 kg/m   Wt Readings from Last 3 Encounters:  08/16/20 201 lb (91.2 kg)  05/10/20 198 lb (89.8 kg)  03/21/20 197 lb (89.4 kg)    General: Appears her stated age, well developed, well nourished, appears unwell but in NAD. Skin: Warm, dry and intact.  Pale. HEENT: Head: normal shape and size; Eyes: sclera white and EOMs intact; Neck:  Neck supple, trachea midline. No masses, lumps or thyromegaly present.  Cardiovascular: Tachycardic with a regular rhythm.  No JVD or BLE edema. No carotid bruits noted. Pulmonary/Chest: Normal effort and positive vesicular breath sounds. No respiratory distress. No wheezes, rales or ronchi noted.  Abdomen: Soft and nontender. Normal bowel sounds. No distention or masses noted. Liver, spleen and kidneys non  palpable. Musculoskeletal: Generally weak, in wheelchair. Neurological: Alert and oriented to person and place.   BMET    Component Value Date/Time   NA 136 08/16/2020 1218   NA 140 03/22/2013 0800   K 4.9 08/16/2020 1218   K 4.1 03/22/2013 0800   CL 101 08/16/2020 1218   CL 106 03/22/2013 0800   CO2 25 08/16/2020 1218   CO2 27 03/22/2013 0800   GLUCOSE 265 (H) 08/16/2020 1218   GLUCOSE 104 (H) 03/22/2013 0800   BUN 36 (H) 08/16/2020 1218   BUN 15 03/22/2013 0800   CREATININE 1.45 (H) 08/16/2020 1218   CREATININE 0.91 (H) 10/24/2016 1502   CALCIUM 9.4 08/16/2020 1218   CALCIUM 9.2 03/22/2013 0800  GFRNONAA 59 (L) 03/22/2013 0800   GFRAA >60 03/22/2013 0800    Lipid Panel     Component Value Date/Time   CHOL 144 08/16/2020 1218   TRIG 196.0 (H) 08/16/2020 1218   HDL 41.20 08/16/2020 1218   CHOLHDL 3 08/16/2020 1218   VLDL 39.2 08/16/2020 1218   LDLCALC 63 08/16/2020 1218    CBC    Component Value Date/Time   WBC 8.3 05/10/2020 1542   RBC 4.41 05/10/2020 1542   HGB 14.5 05/10/2020 1542   HGB 13.0 09/22/2012 0934   HCT 42.7 05/10/2020 1542   HCT 39.4 09/22/2012 0934   PLT 235.0 05/10/2020 1542   PLT 253 09/22/2012 0934   MCV 96.9 05/10/2020 1542   MCV 91 09/22/2012 0934   MCH 30.4 10/24/2016 1502   MCHC 34.0 05/10/2020 1542   RDW 13.1 05/10/2020 1542   RDW 13.0 09/22/2012 0934   LYMPHSABS 1.7 09/22/2012 0934   MONOABS 0.7 09/22/2012 0934   EOSABS 0.2 09/22/2012 0934   BASOSABS 0.1 09/22/2012 0934    Hgb A1C Lab Results  Component Value Date   HGBA1C 8.7 (A) 11/15/2020          Assessment & Plan:   Fatigue, Malaise, Generalized Weakness, Shortness of Breath with Exertion, Dizziness, Irregular Heart Rhythm:  She appears to be in A. Fib Indication for ECG: Irregular heart rhythm Interpretation of ECG: A. fib with RVR Comparison of ECG: 06/2010, new onset A. Fib Orthostatics positive CHA2DS2-VASc 2 score of 7.2% for stroke Rx for Metoprolol  25 mg XR 1 tab p.o. daily Rx for Eliquis 2.5 mg twice daily  Urgent referral to cardiology, appointment scheduled for 5/26 at 1:30 PM  RTC in 3 months, follow-up chronic conditions.  ER precautions discussed  Webb Silversmith, NP This visit occurred during the SARS-CoV-2 public health emergency.  Safety protocols were in place, including screening questions prior to the visit, additional usage of staff PPE, and extensive cleaning of exam room while observing appropriate contact time as indicated for disinfecting solutions.

## 2021-02-06 ENCOUNTER — Encounter: Payer: Self-pay | Admitting: Internal Medicine

## 2021-02-06 LAB — COMPREHENSIVE METABOLIC PANEL
AG Ratio: 1.2 (calc) (ref 1.0–2.5)
ALT: 24 U/L (ref 6–29)
AST: 28 U/L (ref 10–35)
Albumin: 3.8 g/dL (ref 3.6–5.1)
Alkaline phosphatase (APISO): 106 U/L (ref 37–153)
BUN/Creatinine Ratio: 20 (calc) (ref 6–22)
BUN: 25 mg/dL (ref 7–25)
CO2: 25 mmol/L (ref 20–32)
Calcium: 9.7 mg/dL (ref 8.6–10.4)
Chloride: 102 mmol/L (ref 98–110)
Creat: 1.25 mg/dL — ABNORMAL HIGH (ref 0.60–0.88)
Globulin: 3.1 g/dL (calc) (ref 1.9–3.7)
Glucose, Bld: 197 mg/dL — ABNORMAL HIGH (ref 65–99)
Potassium: 5 mmol/L (ref 3.5–5.3)
Sodium: 137 mmol/L (ref 135–146)
Total Bilirubin: 1.4 mg/dL — ABNORMAL HIGH (ref 0.2–1.2)
Total Protein: 6.9 g/dL (ref 6.1–8.1)

## 2021-02-06 LAB — CBC
HCT: 47.3 % — ABNORMAL HIGH (ref 35.0–45.0)
Hemoglobin: 15.6 g/dL — ABNORMAL HIGH (ref 11.7–15.5)
MCH: 31.4 pg (ref 27.0–33.0)
MCHC: 33 g/dL (ref 32.0–36.0)
MCV: 95.2 fL (ref 80.0–100.0)
MPV: 10.6 fL (ref 7.5–12.5)
Platelets: 275 10*3/uL (ref 140–400)
RBC: 4.97 10*6/uL (ref 3.80–5.10)
RDW: 11.8 % (ref 11.0–15.0)
WBC: 10.2 10*3/uL (ref 3.8–10.8)

## 2021-02-06 LAB — T4, FREE: Free T4: 1.7 ng/dL (ref 0.8–1.8)

## 2021-02-06 LAB — TSH: TSH: 2.48 mIU/L (ref 0.40–4.50)

## 2021-02-06 NOTE — Assessment & Plan Note (Signed)
POCT A1c 9.4% Encouraged her to consume a low-carb diet Increase glipizide to 10 mg XL daily, Rx sent to pharmacy Encourage routine eye exams Encourage routine foot exams Encouraged her to get her COVID booster Flu, Pneumovax and Prevnar UTD

## 2021-02-06 NOTE — Assessment & Plan Note (Signed)
Continue Memantine She will continue to see neurology, will follow

## 2021-02-06 NOTE — Assessment & Plan Note (Signed)
Orthostatic off Losartan Referral to cardiology for further evaluation and treatment C-Met today

## 2021-02-06 NOTE — Assessment & Plan Note (Signed)
No issues off meds Will monitor CBC and c-Met today

## 2021-02-06 NOTE — Assessment & Plan Note (Signed)
C-Met and lipid profile today Encouraged her to consume a low-fat diet Continue atorvastatin 

## 2021-02-06 NOTE — Assessment & Plan Note (Signed)
TSH and free T4 today Will adjust Levothyroxine if needed based on labs

## 2021-02-07 ENCOUNTER — Encounter: Payer: Self-pay | Admitting: Internal Medicine

## 2021-02-07 ENCOUNTER — Ambulatory Visit: Payer: Medicare Other | Admitting: Internal Medicine

## 2021-02-07 ENCOUNTER — Emergency Department: Payer: Medicare Other

## 2021-02-07 ENCOUNTER — Inpatient Hospital Stay
Admission: EM | Admit: 2021-02-07 | Discharge: 2021-02-14 | DRG: 064 | Disposition: A | Discharge status: Home Health | Payer: Medicare Other | Attending: Internal Medicine | Admitting: Internal Medicine

## 2021-02-07 ENCOUNTER — Other Ambulatory Visit: Payer: Self-pay

## 2021-02-07 VITALS — BP 120/70 | HR 80 | Ht 62.0 in | Wt 200.0 lb

## 2021-02-07 DIAGNOSIS — E860 Dehydration: Secondary | ICD-10-CM | POA: Diagnosis present

## 2021-02-07 DIAGNOSIS — F419 Anxiety disorder, unspecified: Secondary | ICD-10-CM | POA: Diagnosis present

## 2021-02-07 DIAGNOSIS — K219 Gastro-esophageal reflux disease without esophagitis: Secondary | ICD-10-CM | POA: Diagnosis present

## 2021-02-07 DIAGNOSIS — U071 COVID-19: Secondary | ICD-10-CM | POA: Diagnosis not present

## 2021-02-07 DIAGNOSIS — R29702 NIHSS score 2: Secondary | ICD-10-CM | POA: Diagnosis not present

## 2021-02-07 DIAGNOSIS — I10 Essential (primary) hypertension: Secondary | ICD-10-CM | POA: Diagnosis not present

## 2021-02-07 DIAGNOSIS — R296 Repeated falls: Secondary | ICD-10-CM | POA: Diagnosis present

## 2021-02-07 DIAGNOSIS — R531 Weakness: Secondary | ICD-10-CM | POA: Insufficient documentation

## 2021-02-07 DIAGNOSIS — Z823 Family history of stroke: Secondary | ICD-10-CM

## 2021-02-07 DIAGNOSIS — G319 Degenerative disease of nervous system, unspecified: Secondary | ICD-10-CM | POA: Diagnosis not present

## 2021-02-07 DIAGNOSIS — Z66 Do not resuscitate: Secondary | ICD-10-CM | POA: Diagnosis not present

## 2021-02-07 DIAGNOSIS — G3184 Mild cognitive impairment, so stated: Secondary | ICD-10-CM | POA: Diagnosis not present

## 2021-02-07 DIAGNOSIS — I152 Hypertension secondary to endocrine disorders: Secondary | ICD-10-CM | POA: Diagnosis present

## 2021-02-07 DIAGNOSIS — Z8673 Personal history of transient ischemic attack (TIA), and cerebral infarction without residual deficits: Secondary | ICD-10-CM | POA: Diagnosis not present

## 2021-02-07 DIAGNOSIS — I639 Cerebral infarction, unspecified: Secondary | ICD-10-CM

## 2021-02-07 DIAGNOSIS — R5383 Other fatigue: Secondary | ICD-10-CM | POA: Insufficient documentation

## 2021-02-07 DIAGNOSIS — G8194 Hemiplegia, unspecified affecting left nondominant side: Secondary | ICD-10-CM | POA: Diagnosis present

## 2021-02-07 DIAGNOSIS — R29898 Other symptoms and signs involving the musculoskeletal system: Secondary | ICD-10-CM | POA: Diagnosis not present

## 2021-02-07 DIAGNOSIS — E7800 Pure hypercholesterolemia, unspecified: Secondary | ICD-10-CM

## 2021-02-07 DIAGNOSIS — Z85118 Personal history of other malignant neoplasm of bronchus and lung: Secondary | ICD-10-CM

## 2021-02-07 DIAGNOSIS — I4819 Other persistent atrial fibrillation: Secondary | ICD-10-CM | POA: Diagnosis not present

## 2021-02-07 DIAGNOSIS — Z6831 Body mass index (BMI) 31.0-31.9, adult: Secondary | ICD-10-CM

## 2021-02-07 DIAGNOSIS — Z7984 Long term (current) use of oral hypoglycemic drugs: Secondary | ICD-10-CM

## 2021-02-07 DIAGNOSIS — E119 Type 2 diabetes mellitus without complications: Secondary | ICD-10-CM

## 2021-02-07 DIAGNOSIS — E669 Obesity, unspecified: Secondary | ICD-10-CM | POA: Diagnosis present

## 2021-02-07 DIAGNOSIS — E1169 Type 2 diabetes mellitus with other specified complication: Secondary | ICD-10-CM | POA: Diagnosis not present

## 2021-02-07 DIAGNOSIS — E871 Hypo-osmolality and hyponatremia: Secondary | ICD-10-CM | POA: Diagnosis not present

## 2021-02-07 DIAGNOSIS — I129 Hypertensive chronic kidney disease with stage 1 through stage 4 chronic kidney disease, or unspecified chronic kidney disease: Secondary | ICD-10-CM | POA: Diagnosis not present

## 2021-02-07 DIAGNOSIS — Z8551 Personal history of malignant neoplasm of bladder: Secondary | ICD-10-CM | POA: Diagnosis not present

## 2021-02-07 DIAGNOSIS — Z85820 Personal history of malignant melanoma of skin: Secondary | ICD-10-CM | POA: Diagnosis not present

## 2021-02-07 DIAGNOSIS — I4891 Unspecified atrial fibrillation: Secondary | ICD-10-CM

## 2021-02-07 DIAGNOSIS — E039 Hypothyroidism, unspecified: Secondary | ICD-10-CM | POA: Diagnosis present

## 2021-02-07 DIAGNOSIS — W19XXXA Unspecified fall, initial encounter: Secondary | ICD-10-CM | POA: Diagnosis not present

## 2021-02-07 DIAGNOSIS — I63511 Cerebral infarction due to unspecified occlusion or stenosis of right middle cerebral artery: Principal | ICD-10-CM | POA: Diagnosis present

## 2021-02-07 DIAGNOSIS — N1831 Chronic kidney disease, stage 3a: Secondary | ICD-10-CM | POA: Diagnosis present

## 2021-02-07 DIAGNOSIS — F0390 Unspecified dementia without behavioral disturbance: Secondary | ICD-10-CM | POA: Diagnosis present

## 2021-02-07 DIAGNOSIS — Z888 Allergy status to other drugs, medicaments and biological substances status: Secondary | ICD-10-CM | POA: Diagnosis not present

## 2021-02-07 DIAGNOSIS — Z7901 Long term (current) use of anticoagulants: Secondary | ICD-10-CM | POA: Diagnosis not present

## 2021-02-07 DIAGNOSIS — J9811 Atelectasis: Secondary | ICD-10-CM | POA: Diagnosis not present

## 2021-02-07 DIAGNOSIS — Z79899 Other long term (current) drug therapy: Secondary | ICD-10-CM

## 2021-02-07 DIAGNOSIS — W19XXXD Unspecified fall, subsequent encounter: Secondary | ICD-10-CM | POA: Diagnosis not present

## 2021-02-07 DIAGNOSIS — Z79890 Hormone replacement therapy (postmenopausal): Secondary | ICD-10-CM

## 2021-02-07 DIAGNOSIS — E785 Hyperlipidemia, unspecified: Secondary | ICD-10-CM | POA: Diagnosis not present

## 2021-02-07 DIAGNOSIS — R29701 NIHSS score 1: Secondary | ICD-10-CM | POA: Diagnosis present

## 2021-02-07 DIAGNOSIS — E1122 Type 2 diabetes mellitus with diabetic chronic kidney disease: Secondary | ICD-10-CM | POA: Diagnosis not present

## 2021-02-07 DIAGNOSIS — F05 Delirium due to known physiological condition: Secondary | ICD-10-CM | POA: Diagnosis present

## 2021-02-07 DIAGNOSIS — R4781 Slurred speech: Secondary | ICD-10-CM | POA: Diagnosis present

## 2021-02-07 DIAGNOSIS — R5381 Other malaise: Secondary | ICD-10-CM | POA: Diagnosis present

## 2021-02-07 DIAGNOSIS — Z8249 Family history of ischemic heart disease and other diseases of the circulatory system: Secondary | ICD-10-CM

## 2021-02-07 DIAGNOSIS — E1159 Type 2 diabetes mellitus with other circulatory complications: Secondary | ICD-10-CM | POA: Diagnosis present

## 2021-02-07 HISTORY — DX: Type 2 diabetes mellitus without complications: E11.9

## 2021-02-07 LAB — COMPREHENSIVE METABOLIC PANEL
ALT: 29 U/L (ref 0–44)
AST: 51 U/L — ABNORMAL HIGH (ref 15–41)
Albumin: 3.7 g/dL (ref 3.5–5.0)
Alkaline Phosphatase: 94 U/L (ref 38–126)
Anion gap: 10 (ref 5–15)
BUN: 29 mg/dL — ABNORMAL HIGH (ref 8–23)
CO2: 20 mmol/L — ABNORMAL LOW (ref 22–32)
Calcium: 8.7 mg/dL — ABNORMAL LOW (ref 8.9–10.3)
Chloride: 100 mmol/L (ref 98–111)
Creatinine, Ser: 1.32 mg/dL — ABNORMAL HIGH (ref 0.44–1.00)
GFR, Estimated: 38 mL/min — ABNORMAL LOW (ref >60)
Glucose, Bld: 227 mg/dL — ABNORMAL HIGH (ref 70–99)
Potassium: 4.1 mmol/L (ref 3.5–5.1)
Sodium: 130 mmol/L — ABNORMAL LOW (ref 135–145)
Total Bilirubin: 2.4 mg/dL — ABNORMAL HIGH (ref 0.3–1.2)
Total Protein: 7.2 g/dL (ref 6.5–8.1)

## 2021-02-07 LAB — URINALYSIS, COMPLETE (UACMP) WITH MICROSCOPIC
Bilirubin Urine: NEGATIVE
Glucose, UA: 50 mg/dL — AB
Hgb urine dipstick: NEGATIVE
Ketones, ur: NEGATIVE mg/dL
Leukocytes,Ua: NEGATIVE
Nitrite: NEGATIVE
Protein, ur: NEGATIVE mg/dL
Specific Gravity, Urine: 1.019 (ref 1.005–1.030)
pH: 5 (ref 5.0–8.0)

## 2021-02-07 LAB — CBG MONITORING, ED: Glucose-Capillary: 181 mg/dL — ABNORMAL HIGH (ref 70–99)

## 2021-02-07 LAB — CBC
HCT: 43.6 % (ref 36.0–46.0)
Hemoglobin: 15.4 g/dL — ABNORMAL HIGH (ref 12.0–15.0)
MCH: 32.8 pg (ref 26.0–34.0)
MCHC: 35.3 g/dL (ref 30.0–36.0)
MCV: 92.8 fL (ref 80.0–100.0)
Platelets: 278 10*3/uL (ref 150–400)
RBC: 4.7 MIL/uL (ref 3.87–5.11)
RDW: 12.6 % (ref 11.5–15.5)
WBC: 12.3 10*3/uL — ABNORMAL HIGH (ref 4.0–10.5)
nRBC: 0 % (ref 0.0–0.2)

## 2021-02-07 LAB — TROPONIN I (HIGH SENSITIVITY): Troponin I (High Sensitivity): 11 ng/L (ref <18)

## 2021-02-07 MED ORDER — SODIUM CHLORIDE 0.9 % IV SOLN: INTRAVENOUS | Status: DC

## 2021-02-07 MED ORDER — MEMANTINE HCL 5 MG PO TABS
10.0000 mg | ORAL_TABLET | Freq: Two times a day (BID) | ORAL | Status: DC
  Administered 2021-02-07 – 2021-02-14 (×14): 10 mg via ORAL
  Filled 2021-02-07 (×15): qty 2

## 2021-02-07 MED ORDER — ACETAMINOPHEN 325 MG PO TABS
650.0000 mg | ORAL_TABLET | ORAL | Status: DC | PRN
Start: 2021-02-07 — End: 2021-02-14
  Administered 2021-02-08 – 2021-02-10 (×5): 650 mg via ORAL
  Filled 2021-02-07 (×5): qty 2

## 2021-02-07 MED ORDER — ACETAMINOPHEN 160 MG/5ML PO SOLN
650.0000 mg | ORAL | Status: DC | PRN
  Filled 2021-02-07: qty 20.3

## 2021-02-07 MED ORDER — APIXABAN 2.5 MG PO TABS
2.5000 mg | ORAL_TABLET | Freq: Two times a day (BID) | ORAL | Status: DC
  Administered 2021-02-07 – 2021-02-09 (×4): 2.5 mg via ORAL
  Filled 2021-02-07 (×6): qty 1

## 2021-02-07 MED ORDER — METOPROLOL SUCCINATE ER 25 MG PO TB24
25.0000 mg | ORAL_TABLET | Freq: Every day | ORAL | Status: DC
  Administered 2021-02-08 – 2021-02-14 (×7): 25 mg via ORAL
  Filled 2021-02-07 (×7): qty 1

## 2021-02-07 MED ORDER — LORAZEPAM 2 MG/ML IJ SOLN
1.0000 mg | Freq: Once | INTRAMUSCULAR | Status: AC
  Administered 2021-02-08: 1 mg via INTRAVENOUS
  Filled 2021-02-07: qty 1

## 2021-02-07 MED ORDER — SENNOSIDES-DOCUSATE SODIUM 8.6-50 MG PO TABS
1.0000 | ORAL_TABLET | Freq: Every evening | ORAL | Status: DC | PRN
  Administered 2021-02-09: 09:00:00 1 via ORAL
  Filled 2021-02-07: qty 1

## 2021-02-07 MED ORDER — VITAMIN D3 25 MCG (1000 UNIT) PO TABS
1000.0000 [IU] | ORAL_TABLET | Freq: Every day | ORAL | Status: DC
  Administered 2021-02-08 – 2021-02-14 (×7): 1000 [IU] via ORAL
  Filled 2021-02-07 (×12): qty 1

## 2021-02-07 MED ORDER — INSULIN ASPART 100 UNIT/ML IJ SOLN
0.0000 [IU] | Freq: Three times a day (TID) | INTRAMUSCULAR | Status: DC
  Administered 2021-02-07: 2 [IU] via SUBCUTANEOUS
  Administered 2021-02-09: 17:00:00 3 [IU] via SUBCUTANEOUS
  Administered 2021-02-09: 1 [IU] via SUBCUTANEOUS
  Administered 2021-02-10 (×2): 2 [IU] via SUBCUTANEOUS
  Administered 2021-02-10: 7 [IU] via SUBCUTANEOUS
  Administered 2021-02-11 (×2): 3 [IU] via SUBCUTANEOUS
  Administered 2021-02-11: 1 [IU] via SUBCUTANEOUS
  Administered 2021-02-12: 3 [IU] via SUBCUTANEOUS
  Administered 2021-02-12: 5 [IU] via SUBCUTANEOUS
  Administered 2021-02-12 – 2021-02-13 (×3): 2 [IU] via SUBCUTANEOUS
  Administered 2021-02-13 – 2021-02-14 (×2): 7 [IU] via SUBCUTANEOUS
  Administered 2021-02-14: 2 [IU] via SUBCUTANEOUS
  Filled 2021-02-07 (×17): qty 1

## 2021-02-07 MED ORDER — LEVOTHYROXINE SODIUM 88 MCG PO TABS
88.0000 ug | ORAL_TABLET | Freq: Every day | ORAL | Status: DC
  Administered 2021-02-08 – 2021-02-14 (×7): 88 ug via ORAL
  Filled 2021-02-07 (×8): qty 1

## 2021-02-07 MED ORDER — ACETAMINOPHEN 650 MG RE SUPP: 650.0000 mg | RECTAL | Status: DC | PRN

## 2021-02-07 MED ORDER — STROKE: EARLY STAGES OF RECOVERY BOOK: Freq: Once | Status: DC

## 2021-02-07 MED ORDER — PANTOPRAZOLE SODIUM 40 MG PO TBEC
40.0000 mg | DELAYED_RELEASE_TABLET | Freq: Every day | ORAL | Status: DC
  Administered 2021-02-07 – 2021-02-14 (×8): 40 mg via ORAL
  Filled 2021-02-07 (×8): qty 1

## 2021-02-07 MED ORDER — SODIUM CHLORIDE 0.9 % IV BOLUS
500.0000 mL | Freq: Once | INTRAVENOUS | Status: AC
  Administered 2021-02-07: 500 mL via INTRAVENOUS

## 2021-02-07 MED ORDER — ATORVASTATIN CALCIUM 20 MG PO TABS
10.0000 mg | ORAL_TABLET | Freq: Every day | ORAL | Status: DC
  Administered 2021-02-08 – 2021-02-14 (×7): 10 mg via ORAL
  Filled 2021-02-07 (×9): qty 1

## 2021-02-07 NOTE — ED Notes (Signed)
This RN called EVS supervisor states bed is being cleaned at this time.

## 2021-02-07 NOTE — ED Provider Notes (Signed)
[facility] Emergency Department Provider Note ____________________________________________   Event Date/Time   First MD Initiated Contact with Patient 02/07/21 1639     (approximate)  I have reviewed the triage vital signs and the nursing notes.   HISTORY  Chief Complaint Altered Mental Status  EM caveat: poor memory, somewhat poor historian  HPI Michelle Vang is a 87 y.o. female a history of mild cognitive memory issues per daughter, bladder cancer diabetes hypertension thyroid disease  Patient over the last 3 to 4 weeks has had a rather significant decline, she has been "lethargic" with low energy levels per her daughter.  About 2 or 3 days ago daughter noticed she seemed a little more weak in her left arm and left leg and also seemed at times like her speech is just been a little bit different.  She was recently diagnosed also with new onset A. fib.  She saw Dr. Saunders Revel in cardiology today  I spoke with Dr. Saunders Revel and he reports that the daughter has come home and found the patient on the floor a couple of times recently, also she is having multiple falls up to 6 or 8 falls in the last few weeks and seems to be generally worsening.  Dr. Into my conversation thought that the patient would probably warrant admission to the hospital but further work-up needs to be performed    Past Medical History:  Diagnosis Date  . Bladder cancer ([place])   . Diabetes mellitus without complication ([place])   . GERD (gastroesophageal reflux disease)   . Hypertension   . Lung cancer ([place])   . Thyroid disease     Patient Active Problem List   Diagnosis Date Noted  . Atrial fibrillation ([place]) 02/07/2021  . Lethargy 02/07/2021  . Weakness 02/07/2021  . Falls 02/07/2021  . HLD (hyperlipidemia) 08/16/2020  . Mild cognitive impairment with memory loss 03/28/2019  . Type 2 diabetes mellitus without complication, without long-term current use of insulin ([place]) 12/22/2017  . Thyroid  activity decreased 10/02/2015  . GERD 01/11/2009  . Essential hypertension 12/12/2008    Past Surgical History:  Procedure Laterality Date  . CYSTOSCOPY    . LUNG CANCER SURGERY     Removed 1 lobe on Right lung  . SKIN CANCER EXCISION  2014   Melanoma    Prior to Admission medications   Medication Sig Start Date End Date Taking? Authorizing Provider  apixaban (ELIQUIS) 2.5 MG TABS tablet Take 1 tablet (2.5 mg total) by mouth 2 (two) times daily. 02/05/21   Jearld Fenton, NP  atorvastatin (LIPITOR) 10 MG tablet TAKE 1 TABLET BY MOUTH ONCE A DAY 11/23/20   Jearld Fenton, NP  Cholecalciferol (VITAMIN D3) 1000 UNITS CAPS Take 1 capsule by mouth daily. Reported on 11/15/2015    [provider]  fluocinonide (LIDEX) 0.05 % external solution Apply 1 application topically as needed. 09/12/20   [provider]  glipiZIDE (GLUCOTROL XL) 5 MG 24 hr tablet Take 2 tablets (10 mg total) by mouth daily with breakfast. 02/05/21   Jearld Fenton, NP  levothyroxine (SYNTHROID) 88 MCG tablet TAKE 1 TAB BY MOUTH ONCE DAILY. TAKE ON AN EMPTY STOMACH WITH A GLASS OF WATER ATLEAST 30-60 MINUTES BEFORE BREAKFAST 02/05/21   Jearld Fenton, NP  memantine [facility]) 10 MG tablet Take 1 tablet twice a day 01/23/20   Cameron Sprang, MD  metoprolol succinate (TOPROL-XL) 25 MG 24 hr tablet Take 1 tablet (25 mg  total) by mouth daily. 02/05/21   Jearld Fenton, NP  Omega-3 Fatty Acids (FISH OIL) 1000 MG CAPS Take 1 capsule by mouth daily.    [provider]    Allergies Aricept [donepezil hcl]  Family History  Problem Relation Age of Onset  . Stroke Mother   . Heart disease Father   . Cancer Sister        Pancreatic    Social History Social History   Tobacco Use  . Smoking status: Never Smoker  . Smokeless tobacco: Never Used  Vaping Use  . Vaping Use: Never used  Substance Use Topics  . Alcohol use: No    Alcohol/week: 0.0 standard drinks  . Drug use: No    Review of  Systems Constitutional: No fever/chills Eyes: No visual changes. ENT: No sore throat. Cardiovascular: Denies chest pain. Respiratory: Denies shortness of breath. Gastrointestinal: No abdominal pain.   Genitourinary: Negative for dysuria. Musculoskeletal: No pain anywhere Skin: Negative for rash. Neurological: Negative for headaches.  Daughter concerned the patient seems a little bit weaker on her left arm and left leg at home and has had to have some assistance with elevating the left leg at times.    ____________________________________________   PHYSICAL EXAM:  VITAL SIGNS: ED Triage Vitals  Enc Vitals Group     BP 02/07/21 1407 108/63     Pulse Rate 02/07/21 1407 78     Resp 02/07/21 1407 20     Temp 02/07/21 1406 97.8 F (36.6 C)     Temp Source 02/07/21 1406 Oral     SpO2 02/07/21 1407 97 %     Weight 02/07/21 1406 198 lb 6.6 oz (90 kg)     Height 02/07/21 1406 5\' 2"  (1.575 m)     Head Circumference --      Peak Flow --      Pain Score --      Pain Loc --      Pain Edu? --      Excl. in [place]? --     Constitutional: Alert and oriented.  Fatigued but in no acute distress.  Not somnolent. Eyes: Conjunctivae are normal. Head: Atraumatic. Nose: No congestion/rhinnorhea. Mouth/Throat: Mucous membranes are somewhat dry Neck: No stridor.  Cardiovascular: Normal rate, irregular rhythm. Grossly normal heart sounds.  Good peripheral circulation. Respiratory: Normal respiratory effort.  No retractions. Lungs CTAB. Gastrointestinal: Soft and nontender. No distention. Musculoskeletal: No lower extremity tenderness nor edema.  Generally weak in all extremities not obviously having focal deficits. Neurologic:  Normal speech and language. No gross focal neurologic deficits are appreciated.  Seems generally weak.  I do not see any obvious unilateral deficits.  Cranial nerve exams grossly normal.  Patient has some difficulty in participating throughout the whole exam Skin:  Skin is  warm, dry and intact. No rash noted. Psychiatric: Mood and affect are calm, little flat. Speech and behavior are short but words are discernible I do not obviously detect any slurring.  ____________________________________________   LABS (all labs ordered are listed, but only abnormal results are displayed)  Labs Reviewed  COMPREHENSIVE METABOLIC PANEL - Abnormal; Notable for the following components:      Result Value   Sodium 130 (*)    CO2 20 (*)    Glucose, Bld 227 (*)    BUN 29 (*)    Creatinine, Ser 1.32 (*)    Calcium 8.7 (*)    AST 51 (*)    Total Bilirubin 2.4 (*)  GFR, Estimated 38 (*)    All other components within normal limits  CBC - Abnormal; Notable for the following components:   WBC 12.3 (*)    Hemoglobin 15.4 (*)    All other components within normal limits  URINALYSIS, COMPLETE (UACMP) WITH MICROSCOPIC - Abnormal; Notable for the following components:   Color, Urine AMBER (*)    APPearance HAZY (*)    Glucose, UA 50 (*)    Bacteria, UA RARE (*)    All other components within normal limits  URINE CULTURE  SARS CORONAVIRUS 2 (TAT 6-24 HRS)  HEMOGLOBIN A1C  LIPID PANEL  TROPONIN I (HIGH SENSITIVITY)   ____________________________________________  EKG  Reviewed interpreted at 1700 Heart rate 90 QRS 90 QTc 440 Atrial fibrillation, left anterior fascicular block.  No evidence of acute ischemia ____________________________________________  RADIOLOGY  CT Head Wo Contrast  Result Date: 02/07/2021 CLINICAL DATA:  Generalized weakness and lethargy. EXAM: CT HEAD WITHOUT CONTRAST TECHNIQUE: Contiguous axial images were obtained from the base of the skull through the vertex without intravenous contrast. COMPARISON:  No comparison studies available. FINDINGS: Brain: There is no evidence for acute hemorrhage, hydrocephalus, mass lesion, or abnormal extra-axial fluid collection. No definite CT evidence for acute infarction. Patchy low attenuation in the deep  hemispheric and periventricular white matter is nonspecific, but likely reflects chronic microvascular ischemic demyelination. Vascular: No hyperdense vessel or unexpected calcification. Skull: No evidence for fracture. No worrisome lytic or sclerotic lesion. Sinuses/Orbits: The visualized paranasal sinuses and mastoid air cells are clear. Visualized portions of the globes and intraorbital fat are unremarkable. Other: None. IMPRESSION: 1. No acute intracranial abnormality. 2. Chronic small vessel white matter ischemic disease. Electronically Signed   By: Misty Stanley M.D.   On: 02/07/2021 14:59   DG Chest Portable 1 View  Result Date: 02/07/2021 CLINICAL DATA:  Weakness.  Multiple falls.  Lethargy. EXAM: PORTABLE CHEST 1 VIEW COMPARISON:  Chest radiograph 12/12/2008 FINDINGS: Low lung volumes with bronchovascular crowding. Chain sutures noted at the right hilum with slight elevation of right hemidiaphragm. Normal heart size for technique. Minor left basilar atelectasis. No confluent consolidation, pulmonary edema, or pleural effusion. No acute osseous abnormalities are seen. IMPRESSION: Low lung volumes with bronchovascular crowding and left basilar atelectasis. Electronically Signed   By: Keith Rake M.D.   On: 02/07/2021 17:14   CT of the head negative for acute.  Chest x-ray reviewed negative for acute finding.  Low lung volume. ____________________________________________   PROCEDURES  Procedure(s) performed: None  Procedures  Critical Care performed: No  ____________________________________________   INITIAL IMPRESSION / ASSESSMENT AND PLAN / ED COURSE  Pertinent labs & imaging results that were available during my care of the patient were reviewed by me and considered in my medical decision making (see chart for details).   Patient presents for worsening over the last 3 or so weeks.  Some concern from the daughter about some slight weakness on the left side but I do not  obviously appreciate this, but certainly these could be evidence of something like a small stroke which might not be radiographically apparent on CT.  She is anticoagulated now for the last couple of days.  Additionally has had several falls, and I discussed this with Dr. Saunders Revel on the phone as well and he thinks it would be reasonable to stop anticoagulation at this point unless she was to have had a stroke, at least temporarily, in light of her multiple falls and this worsening situation  Outpatient work-up  including TSH was reviewed.  We will add urinalysis.  Mild hyponatremia is now notable as well.  Will hydrate, suspect this is likely due to hypovolemia but not yet certain.   Admitted to hospitalist service Dr.  Louanne Belton.  Did discuss desire to obtain MRI, unable to obtain in tonight patient did not tolerate.  I did notify hospitalist of this.  I do not believe that the patient necessarily necessitates emergent MRI at this point, her symptoms have been ongoing now for at least 48 to 72 hours of slight left lower extremity weakness, well outside any tPA or LVO treatment windows.  Patient will be monitored closely in the hospital.  Additional work-up is required, and there is no obvious focal finding on my exam.    ____________________________________________   FINAL CLINICAL IMPRESSION(S) / ED DIAGNOSES  Final diagnoses:  Dehydration  General weakness  Persistent atrial fibrillation ([place])        Note:  This document was prepared using Dragon voice recognition software and may include unintentional dictation errors       Delman Kitten, MD 02/07/21 2002

## 2021-02-07 NOTE — Progress Notes (Signed)
Called and spoke with Colletta Maryland RN to let her know patient was brought down for possible stroke and afib with symptoms lasting the last few days. She has patient on the board with no further questions.

## 2021-02-07 NOTE — ED Triage Notes (Signed)
Pt to ED POV with daughter for altered mental status x 2 weeks . Daughter reports multiple falls, pt lethargic and not interacting appropriately. Daughter states pt has not hit head to her knowledge. Hx dementia Pt not talking to this RN  Just started eliquis and metoprolol

## 2021-02-07 NOTE — ED Notes (Signed)
XR at bedside

## 2021-02-07 NOTE — ED Notes (Signed)
Requested medications from pharmacy

## 2021-02-07 NOTE — ED Notes (Signed)
Patient transported to MRI 

## 2021-02-07 NOTE — ED Notes (Signed)
Per MRI patient confused and not tolerating MRI at this time. Will bring back to ER, we will attempt to do scan later.

## 2021-02-07 NOTE — Progress Notes (Signed)
New Outpatient Visit Date: 02/07/2021  Referring Provider: Jearld Fenton, NP [place],  [place] [address]  Chief Complaint: Atrial fibrillation  HPI:  Michelle Vang is a 87 y.o. female who is being seen today for urgent evaluation of newly diagnosed atrial fibrillation at the request of Michelle Vang. She has a history of hypertension, hyperlipidemia, type 2 diabetes mellitus, hypothyroidism, lung cancer, bladder cancer, and GERD.  She was seen by Michelle Vang two days ago and was noted to be in atrial fibrillation.  Her daughter reported increasing fatigue and somnolence over the preceding week.  She was started on apixaban 2.5 mg twice daily by Michelle Vang and metoprolol succinate 25 mg daily.  Today, Michelle Vang presents with her daughter, who provides most of the history.  At baseline, Michelle Vang has some generalized weakness and lethargy, though this has been significantly more pronounced over the last 3 to 4 weeks.  She has also fallen between 6 and 8 times during this interval.  Beginning yesterday, Michelle Vang's daughter also noticed some weakness in the left leg as well as slurred speech, which in hindsight, may have been present for 2 to 3 days.  She had another fall yesterday, though the patient's daughter does not believe that she struck her head.  She began taking apixaban and metoprolol yesterday.  She has been visibly short of breath with minimal activity for the last few weeks but has not complained of any localizing pain such as chest pain.  She does note frequent dizziness, which Michelle Vang's daughter believes is contributing to her falls.  --------------------------------------------------------------------------------------------------  Cardiovascular History & Procedures: Cardiovascular Problems:  Atrial fibrillation  Risk Factors:  Hypertension, hyperlipidemia, diabetes mellitus, obesity, and age greater than 68  Cath/PCI:  None  CV Surgery:  None  EP Procedures and  Devices:  None  Non-Invasive Evaluation(s):  None  Recent CV Pertinent Labs: Lab Results  Component Value Date   CHOL 144 08/16/2020   HDL 41.20 08/16/2020   LDLCALC 63 08/16/2020   LDLDIRECT 143.0 05/10/2020   TRIG 196.0 (H) 08/16/2020   CHOLHDL 3 08/16/2020   K 5.0 02/05/2021   K 4.1 03/22/2013   BUN 25 02/05/2021   BUN 15 03/22/2013   CREATININE 1.25 (H) 02/05/2021    --------------------------------------------------------------------------------------------------  Past Medical History:  Diagnosis Date  . Bladder cancer ([place])   . GERD (gastroesophageal reflux disease)   . Hypertension   . Lung cancer ([place])   . Thyroid disease     Past Surgical History:  Procedure Laterality Date  . CYSTOSCOPY    . LUNG CANCER SURGERY     Removed 1 lobe on Right lung  . SKIN CANCER EXCISION  2014   Melanoma    Current Meds  Medication Sig  . apixaban (ELIQUIS) 2.5 MG TABS tablet Take 1 tablet (2.5 mg total) by mouth 2 (two) times daily.  Marland Kitchen atorvastatin (LIPITOR) 10 MG tablet TAKE 1 TABLET BY MOUTH ONCE A DAY  . Cholecalciferol (VITAMIN D3) 1000 UNITS CAPS Take 1 capsule by mouth daily. Reported on 11/15/2015  . fluocinonide (LIDEX) 0.05 % external solution Apply 1 application topically as needed.  Marland Kitchen glipiZIDE (GLUCOTROL XL) 5 MG 24 hr tablet Take 2 tablets (10 mg total) by mouth daily with breakfast.  . levothyroxine (SYNTHROID) 88 MCG tablet TAKE 1 TAB BY MOUTH ONCE DAILY. TAKE ON AN EMPTY STOMACH WITH A GLASS OF WATER ATLEAST 30-60 MINUTES BEFORE BREAKFAST  . memantine (NAMENDA) 10 MG  tablet Take 1 tablet twice a day  . metoprolol succinate (TOPROL-XL) 25 MG 24 hr tablet Take 1 tablet (25 mg total) by mouth daily.  . Omega-3 Fatty Acids (FISH OIL) 1000 MG CAPS Take 1 capsule by mouth daily.    Allergies: Aricept [donepezil hcl]  Social History   Tobacco Use  . Smoking status: Never Smoker  . Smokeless tobacco: Never Used  Vaping Use  . Vaping Use: Never used   Substance Use Topics  . Alcohol use: No    Alcohol/week: 0.0 standard drinks  . Drug use: No    Family History  Problem Relation Age of Onset  . Stroke Mother   . Heart disease Father   . Cancer Sister        Pancreatic    Review of Systems: A 12-system review of systems was performed and was negative except as noted in the HPI.  --------------------------------------------------------------------------------------------------  Physical Exam: BP 120/70 (BP Location: Right Arm, Patient Position: Sitting, Cuff Size: Large)   Pulse 80   Ht 5\' 2"  (1.575 m)   Wt 200 lb (90.7 kg)   BMI 36.58 kg/m   General: Elderly woman seated in a wheelchair.  She is accompanied by her daughter. HEENT: No conjunctival pallor or scleral icterus. Facemask in place. Neck: Supple without lymphadenopathy, thyromegaly, JVD, or HJR. No carotid bruit. Lungs: Normal work of breathing. Clear to auscultation bilaterally without wheezes or crackles. Heart: Irregularly irregular rhythm with 1/6 systolic murmur.. Non-displaced PMI. Abd: Bowel sounds present. Soft, NT/ND without hepatosplenomegaly Ext: No lower extremity edema.  2+ radial and pedal pulses bilaterally. Skin: Warm and dry without rash. Neuro: Cranial nerves grossly intact.  No facial droop noted.  4/5 upper and lower extremity strength bilaterally, though exam is somewhat limited by patient's difficulty following commands.  She does not speak. Psych: Normal mood and affect.  EKG: None available, as tracing obtained by Michelle Vang earlier this week has not been loaded into epic and has not been forwarded to [place].  Patient's daughter declined repeat EKG today.  Lab Results  Component Value Date   WBC 10.2 02/05/2021   HGB 15.6 (H) 02/05/2021   HCT 47.3 (H) 02/05/2021   MCV 95.2 02/05/2021   PLT 275 02/05/2021    Lab Results  Component Value Date   NA 137 02/05/2021   K 5.0 02/05/2021   CL 102 02/05/2021   CO2 25 02/05/2021   BUN 25  02/05/2021   CREATININE 1.25 (H) 02/05/2021   GLUCOSE 197 (H) 02/05/2021   ALT 24 02/05/2021    Lab Results  Component Value Date   CHOL 144 08/16/2020   HDL 41.20 08/16/2020   LDLCALC 63 08/16/2020   LDLDIRECT 143.0 05/10/2020   TRIG 196.0 (H) 08/16/2020   CHOLHDL 3 08/16/2020     --------------------------------------------------------------------------------------------------  ASSESSMENT AND PLAN: Atrial fibrillation: Exam is notable for irregularly irregular rhythm which would go along with atrial fibrillation.  Unfortunately, I do not have an EKG to review today, as recent tracing by Michelle Vang has not been made available for my review and the patient's daughter declined repeat EKG today.  Given our plan to transfer Michelle Vang to the emergency department, I think it is reasonable to defer repeat EKG here in the office as it can be performed in the ED.  She seems to be reasonably well rate controlled at this time.  My concern is that her worsening lethargy and slurred speech accompanied by subjective left-sided weakness noted by her  daughter, could indicate recent stroke.  Further ED work-up is indicated.  Anticoagulation is indicated based on a CHA2DS2-VASc score of at least 5, though history of recurrent falls makes me leery.  Should anticoagulation be continued, apixaban 5 mg twice daily would be the appropriate dosing (not 2.5 mg twice daily as currently prescribed) based on the patient's weight and creatinine.  I will defer obtaining an echocardiogram to the inpatient setting, as the patient will likely be admitted.  Lethargy and weakness: Worse over the last few weeks and potentially related to atrial fibrillation though my main concern with daughters report of focal weakness and slurred speech over the last few days would be a subacute stroke.  Patient has been referred to the emergency department for further evaluation.  Given time course and ongoing anticoagulation, she would not  be a tPA candidate.  Hypertension: Blood pressure is normal today.  Defer medication changes at this time.  Follow-up: To be determined based on outcome of the ED visit/hospitalization.  Nelva Bush, MD 02/07/2021 1:37 PM

## 2021-02-07 NOTE — H&P (Addendum)
[facility] History and Physical  Michelle Vang OFB:[id number] DOB: 12/10/1928 DOA: 02/07/2021  Referring physician: ED  PCP: Jearld Fenton, NP   Patient is coming from: Home  Chief Complaint: Lethargic with left-sided weakness  HPI: Michelle Vang is a 87 y.o. female with past medical history of atrial fibrillation was newly diagnosed, hyperlipidemia, type 2 diabetes, hypothyroidism, history of lung cancer, bladder cancer and GERD had initially presented to cardiology clinic with increasing fatigue and somnolence over the week.    Patient had been more somnolent and weak over the last 1 week with multiple episodes of falls at least 6-8 times.  Patient's daughter had found her on the floor and noticed some left arm and leg weakness as well including slurred speech.  She was supposed to start taking apixaban and metoprolol yesterday.  She did have dizziness and lightheadedness before she falls.  She does have a lot of balance problem and the daughter states that she is unable to manage her at this time.  No history of nausea vomiting diarrhea.  She does not eat well.  No fever, chills or rigor.  No urinary urgency frequency or dysuria as per the patient's daughter.  ED Course: In the ED, UA was negative.  Troponin was negative as well Sodium was 130.  Bicarb of 20.  Creatinine was slightly elevated at 1.3.  Cytosis with WBC of 12.3.  EKG done in the ED showed atrial fibrillation.  Chest x-ray showed low lung volumes with bronchovascular crowding and left basilar atelectasis.  Skin showed no acute intracranial abnormality.  There was evidence of chronic small vessel disease.  Patient was then admitted to the hospital for further evaluation of falls, lethargic and questionable left-sided weakness.  Review of Systems:  All systems were reviewed and were negative unless otherwise mentioned in the HPI  Past Medical History:  Diagnosis Date  . Bladder cancer ([place])   . Diabetes mellitus  without complication ([place])   . GERD (gastroesophageal reflux disease)   . Hypertension   . Lung cancer ([place])   . Thyroid disease    Past Surgical History:  Procedure Laterality Date  . CYSTOSCOPY    . LUNG CANCER SURGERY     Removed 1 lobe on Right lung  . SKIN CANCER EXCISION  2014   Melanoma    Social History:  reports that she has never smoked. She has never used smokeless tobacco. She reports that she does not drink alcohol and does not use drugs.  Allergies  Allergen Reactions  . Aricept [Donepezil Hcl] Diarrhea    Family History  Problem Relation Age of Onset  . Stroke Mother   . Heart disease Father   . Cancer Sister        Pancreatic     Prior to Admission medications   Medication Sig Start Date End Date Taking? Authorizing Provider  apixaban (ELIQUIS) 2.5 MG TABS tablet Take 1 tablet (2.5 mg total) by mouth 2 (two) times daily. 02/05/21   Jearld Fenton, NP  atorvastatin (LIPITOR) 10 MG tablet TAKE 1 TABLET BY MOUTH ONCE A DAY 11/23/20   Jearld Fenton, NP  Cholecalciferol (VITAMIN D3) 1000 UNITS CAPS Take 1 capsule by mouth daily. Reported on 11/15/2015    [provider]  fluocinonide (LIDEX) 0.05 % external solution Apply 1 application topically as needed. 09/12/20   [provider]  glipiZIDE (GLUCOTROL XL) 5 MG 24 hr tablet Take 2 tablets (10 mg total) by mouth daily  with breakfast. 02/05/21   Jearld Fenton, NP  levothyroxine (SYNTHROID) 88 MCG tablet TAKE 1 TAB BY MOUTH ONCE DAILY. TAKE ON AN EMPTY STOMACH WITH A GLASS OF WATER ATLEAST 30-60 MINUTES BEFORE BREAKFAST 02/05/21   Jearld Fenton, NP  memantine [facility]) 10 MG tablet Take 1 tablet twice a day 01/23/20   Cameron Sprang, MD  metoprolol succinate (TOPROL-XL) 25 MG 24 hr tablet Take 1 tablet (25 mg total) by mouth daily. 02/05/21   Jearld Fenton, NP  Omega-3 Fatty Acids (FISH OIL) 1000 MG CAPS Take 1 capsule by mouth daily.    [provider]    Physical Exam: Vitals:    02/07/21 1406 02/07/21 1407  BP:  108/63  Pulse:  78  Resp:  20  Temp: 97.8 F (36.6 C)   TempSrc: Oral   SpO2:  97%  Weight: 90 kg   Height: 5\' 2"  (1.575 m)    Wt Readings from Last 3 Encounters:  02/07/21 90 kg  02/07/21 90.7 kg  02/05/21 89.8 kg   Body mass index is 36.29 kg/m.  General: Obese, alert awake and disoriented, communicative at the time of my exam HENT: Normocephalic, pupils equally reacting to light and accommodation.  No scleral pallor or icterus noted. Oral mucosa is moist.  Chest:  Clear breath sounds.  Diminished breath sounds bilaterally. No crackles or wheezes.  CVS: S1 &S2 heard. No murmur.  Irregularly irregular rhythm Abdomen: Soft, nontender, nondistended.  Bowel sounds are heard.  Liver is not palpable, no abdominal mass palpated Extremities: No cyanosis, clubbing or edema.  Peripheral pulses are palpable.  Moves all extremities.  Psych:Dementia CNS:  No cranial nerve deficits.  Questionable left upper extremity weakness  Skin: Warm and dry.  No rashes noted.  Labs on Admission:   CBC: Recent Labs  Lab 02/05/21 1346 02/07/21 1412  WBC 10.2 12.3*  HGB 15.6* 15.4*  HCT 47.3* 43.6  MCV 95.2 92.8  PLT 275 035    Basic Metabolic Panel: Recent Labs  Lab 02/05/21 1346 02/07/21 1412  NA 137 130*  K 5.0 4.1  CL 102 100  CO2 25 20*  GLUCOSE 197* 227*  BUN 25 29*  CREATININE 1.25* 1.32*  CALCIUM 9.7 8.7*    Liver Function Tests: Recent Labs  Lab 02/05/21 1346 02/07/21 1412  AST 28 51*  ALT 24 29  ALKPHOS  --  94  BILITOT 1.4* 2.4*  PROT 6.9 7.2  ALBUMIN  --  3.7   No results for input(s): LIPASE, AMYLASE in the last 168 hours. No results for input(s): AMMONIA in the last 168 hours.  Cardiac Enzymes: No results for input(s): CKTOTAL, CKMB, CKMBINDEX, TROPONINI in the last 168 hours.  BNP (last 3 results) No results for input(s): BNP in the last 8760 hours.  ProBNP (last 3 results) No results for input(s): PROBNP in the  last 8760 hours.  CBG: No results for input(s): GLUCAP in the last 168 hours.  Lipase  No results found for: LIPASE   Urinalysis    Component Value Date/Time   COLORURINE AMBER (A) 02/07/2021 1727   APPEARANCEUR HAZY (A) 02/07/2021 1727   LABSPEC 1.019 02/07/2021 1727   PHURINE 5.0 02/07/2021 1727   GLUCOSEU 50 (A) 02/07/2021 1727   HGBUR NEGATIVE 02/07/2021 1727   BILIRUBINUR NEGATIVE 02/07/2021 1727   BILIRUBINUR Negative 01/23/2020 [place] 02/07/2021 1727   PROTEINUR NEGATIVE 02/07/2021 1727   UROBILINOGEN 0.2 01/23/2020 1423   NITRITE NEGATIVE  02/07/2021 [place] 02/07/2021 1727     Drugs of Abuse  No results found for: LABOPIA, COCAINSCRNUR, LABBENZ, AMPHETMU, THCU, LABBARB    Radiological Exams on Admission: CT Head Wo Contrast  Result Date: 02/07/2021 CLINICAL DATA:  Generalized weakness and lethargy. EXAM: CT HEAD WITHOUT CONTRAST TECHNIQUE: Contiguous axial images were obtained from the base of the skull through the vertex without intravenous contrast. COMPARISON:  No comparison studies available. FINDINGS: Brain: There is no evidence for acute hemorrhage, hydrocephalus, mass lesion, or abnormal extra-axial fluid collection. No definite CT evidence for acute infarction. Patchy low attenuation in the deep hemispheric and periventricular white matter is nonspecific, but likely reflects chronic microvascular ischemic demyelination. Vascular: No hyperdense vessel or unexpected calcification. Skull: No evidence for fracture. No worrisome lytic or sclerotic lesion. Sinuses/Orbits: The visualized paranasal sinuses and mastoid air cells are clear. Visualized portions of the globes and intraorbital fat are unremarkable. Other: None. IMPRESSION: 1. No acute intracranial abnormality. 2. Chronic small vessel white matter ischemic disease. Electronically Signed   By: Misty Stanley M.D.   On: 02/07/2021 14:59   DG Chest Portable 1 View  Result  Date: 02/07/2021 CLINICAL DATA:  Weakness.  Multiple falls.  Lethargy. EXAM: PORTABLE CHEST 1 VIEW COMPARISON:  Chest radiograph 12/12/2008 FINDINGS: Low lung volumes with bronchovascular crowding. Chain sutures noted at the right hilum with slight elevation of right hemidiaphragm. Normal heart size for technique. Minor left basilar atelectasis. No confluent consolidation, pulmonary edema, or pleural effusion. No acute osseous abnormalities are seen. IMPRESSION: Low lung volumes with bronchovascular crowding and left basilar atelectasis. Electronically Signed   By: Keith Rake M.D.   On: 02/07/2021 17:14    EKG: Personally reviewed by me which shows atrial fibrillation.  Assessment/Plan Principal Problem:   Falls Active Problems:   Essential hypertension   GERD   Type 2 diabetes mellitus without complication, without long-term current use of insulin (HCC)   Mild cognitive impairment with memory loss   HLD (hyperlipidemia)   Atrial fibrillation (HCC)   Lethargy   Weakness  Confusion, intermittent lethargic, weakness questionable left-sided weakness..  Worsening over the last week or 2.  Concern for some slurred speech and left-sided weakness as per the daughter.  Patient with history of A. fib just started on Eliquis and metoprolol yesterday.  CT head scan was negative.  EKG showed atrial fibrillation.  MRI could not be performed though attempted might need to perform under sedation if clinically indicated.  Continue Lipitor.  Stroke protocol followed.   Mild volume depletion. Continue IVF x 1 day, reassess in am.   Recently diagnosed atrial fibrillation.  Was seen at the cardiology clinic today.  Was supposed to start on Eliquis and metoprolol since yesterday.  We will continue Eliquis for now.  Patient does have multiple falls so we might have to discuss about whether to pursue anticoagulation in the long-term or not.  Essential hypertension.  Continue metoprolol.  Continue to monitor  blood pressure closely.  Will allow permissive hypertension at this time  Mild dementia, cognitive dysfunction.  On memantine.  We will continue with that.    GERD.  Protonix.  Hypothyroidism.  Continue Synthroid..  Type 2 diabetes mellitus.  On glipizide at home.  We will put the patient on sliding scale insulin., diabetic diet.  Patient hemoglobin A1c 2 days back was 9.4.  History of lung cancer, bladder cancer-  In remission.  Recurrent falls, deconditioning, debility.  Will get PT OT evaluation.  Fall precautions.  DVT Prophylaxis: Eliquis  Consultant: Neurology   Code Status: DNR, spoke with the patient's daughter about it.  Microbiology none  Antibiotics: None   Family Communication:   Patients' condition and plan of care including tests being ordered have been discussed with the patient and the daughter who indicate understanding and agree with the plan.   Status is: Observation  The patient remains OBS appropriate and will d/c before 2 midnights.  Dispo: The patient is from: Home lives alone, daughters, caregivers provide care.              Anticipated d/c is to: Undetermined.  Home with home health versus rehabilitation.  Will need PT evaluation                Severity of Illness: The appropriate patient status for this patient is OBSERVATION. Observation status is judged to be reasonable and necessary in order to provide the required intensity of service to ensure the patient's safety. The patient's presenting symptoms, physical exam findings, and initial radiographic and laboratory data in the context of their medical condition is felt to place them at decreased risk for further clinical deterioration. Furthermore, it is anticipated that the patient will be medically stable for discharge from the hospital within 2 midnights of admission.   Signed, Flora Lipps, MD [facility] 02/07/2021

## 2021-02-08 ENCOUNTER — Encounter: Payer: Self-pay | Admitting: Internal Medicine

## 2021-02-08 ENCOUNTER — Observation Stay: Payer: Medicare Other

## 2021-02-08 DIAGNOSIS — I4819 Other persistent atrial fibrillation: Secondary | ICD-10-CM | POA: Diagnosis not present

## 2021-02-08 DIAGNOSIS — R5383 Other fatigue: Secondary | ICD-10-CM | POA: Diagnosis not present

## 2021-02-08 DIAGNOSIS — W19XXXD Unspecified fall, subsequent encounter: Secondary | ICD-10-CM | POA: Diagnosis not present

## 2021-02-08 DIAGNOSIS — R531 Weakness: Secondary | ICD-10-CM | POA: Diagnosis not present

## 2021-02-08 DIAGNOSIS — I10 Essential (primary) hypertension: Secondary | ICD-10-CM | POA: Diagnosis not present

## 2021-02-08 DIAGNOSIS — G319 Degenerative disease of nervous system, unspecified: Secondary | ICD-10-CM | POA: Diagnosis not present

## 2021-02-08 DIAGNOSIS — E1169 Type 2 diabetes mellitus with other specified complication: Secondary | ICD-10-CM | POA: Diagnosis not present

## 2021-02-08 DIAGNOSIS — I639 Cerebral infarction, unspecified: Secondary | ICD-10-CM | POA: Diagnosis not present

## 2021-02-08 DIAGNOSIS — E785 Hyperlipidemia, unspecified: Secondary | ICD-10-CM | POA: Diagnosis not present

## 2021-02-08 DIAGNOSIS — Z8673 Personal history of transient ischemic attack (TIA), and cerebral infarction without residual deficits: Secondary | ICD-10-CM | POA: Diagnosis not present

## 2021-02-08 DIAGNOSIS — R29898 Other symptoms and signs involving the musculoskeletal system: Secondary | ICD-10-CM | POA: Diagnosis not present

## 2021-02-08 LAB — CBC
HCT: 41.9 % (ref 36.0–46.0)
Hemoglobin: 14.1 g/dL (ref 12.0–15.0)
MCH: 31.4 pg (ref 26.0–34.0)
MCHC: 33.7 g/dL (ref 30.0–36.0)
MCV: 93.3 fL (ref 80.0–100.0)
Platelets: 250 10*3/uL (ref 150–400)
RBC: 4.49 MIL/uL (ref 3.87–5.11)
RDW: 12.4 % (ref 11.5–15.5)
WBC: 11.1 10*3/uL — ABNORMAL HIGH (ref 4.0–10.5)
nRBC: 0 % (ref 0.0–0.2)

## 2021-02-08 LAB — LIPID PANEL
Cholesterol: 126 mg/dL (ref 0–200)
HDL: 35 mg/dL — ABNORMAL LOW (ref >40)
LDL Cholesterol: 70 mg/dL (ref 0–99)
Total CHOL/HDL Ratio: 3.6 RATIO
Triglycerides: 103 mg/dL (ref <150)
VLDL: 21 mg/dL (ref 0–40)

## 2021-02-08 LAB — BASIC METABOLIC PANEL
Anion gap: 9 (ref 5–15)
BUN: 28 mg/dL — ABNORMAL HIGH (ref 8–23)
CO2: 22 mmol/L (ref 22–32)
Calcium: 8.5 mg/dL — ABNORMAL LOW (ref 8.9–10.3)
Chloride: 107 mmol/L (ref 98–111)
Creatinine, Ser: 1.17 mg/dL — ABNORMAL HIGH (ref 0.44–1.00)
GFR, Estimated: 44 mL/min — ABNORMAL LOW (ref >60)
Glucose, Bld: 119 mg/dL — ABNORMAL HIGH (ref 70–99)
Potassium: 3.8 mmol/L (ref 3.5–5.1)
Sodium: 138 mmol/L (ref 135–145)

## 2021-02-08 LAB — MAGNESIUM: Magnesium: 1.6 mg/dL — ABNORMAL LOW (ref 1.7–2.4)

## 2021-02-08 LAB — GLUCOSE, CAPILLARY
Glucose-Capillary: 108 mg/dL — ABNORMAL HIGH (ref 70–99)
Glucose-Capillary: 144 mg/dL — ABNORMAL HIGH (ref 70–99)
Glucose-Capillary: 188 mg/dL — ABNORMAL HIGH (ref 70–99)

## 2021-02-08 LAB — PHOSPHORUS: Phosphorus: 3.1 mg/dL (ref 2.5–4.6)

## 2021-02-08 LAB — TSH: TSH: 1.612 u[IU]/mL (ref 0.350–4.500)

## 2021-02-08 LAB — SARS CORONAVIRUS 2 (TAT 6-24 HRS): SARS Coronavirus 2: POSITIVE — AB

## 2021-02-08 MED ORDER — ENSURE ENLIVE PO LIQD
237.0000 mL | Freq: Three times a day (TID) | ORAL | Status: DC
  Administered 2021-02-08 – 2021-02-14 (×18): 237 mL via ORAL

## 2021-02-08 MED ORDER — MELATONIN 5 MG PO TABS
5.0000 mg | ORAL_TABLET | Freq: Every evening | ORAL | Status: DC | PRN
  Administered 2021-02-08 – 2021-02-13 (×4): 5 mg via ORAL
  Filled 2021-02-08 (×6): qty 1

## 2021-02-08 MED ORDER — MAGNESIUM SULFATE 2 GM/50ML IV SOLN
2.0000 g | Freq: Once | INTRAVENOUS | Status: AC
  Administered 2021-02-08: 2 g via INTRAVENOUS
  Filled 2021-02-08: qty 50

## 2021-02-08 MED ORDER — ADULT MULTIVITAMIN W/MINERALS CH
1.0000 | ORAL_TABLET | Freq: Every day | ORAL | Status: DC
  Administered 2021-02-09 – 2021-02-14 (×6): 1 via ORAL
  Filled 2021-02-08 (×6): qty 1

## 2021-02-08 NOTE — Progress Notes (Signed)
Transferred to room by wheelchair, ambulated to bed with 1 assist. Oriented to room and call light system, bed alarm on, fall mat in place. Up to [facility] with 1 assist. Scabbed over wounds to left knee and bilateral toes, and one wound to head that was previously scabbed, but reopened due to patient picking at it. Cleansed head wound. IVF initiated. Pt impulsive with frequent attempts to get OOB. Call bell within reach and instructed on how to use.

## 2021-02-08 NOTE — Progress Notes (Signed)
Initial Nutrition Assessment  DOCUMENTATION CODES:  Not applicable  INTERVENTION:   Continue current diet as ordered per SLP recommendations  Ensure Enlive po TID, each supplement provides 350 kcal and 20 grams of protein  Magic cup TID with meals, each supplement provides 290 kcal and 9 grams of protein  Multivitamin with minerals daily  NUTRITION DIAGNOSIS:  Increased nutrient needs related to acute illness (COVID19) as evidenced by estimated needs.  GOAL:  Patient will meet greater than or equal to 90% of their needs  MONITOR:  PO intake,Supplement acceptance,I & O's,Labs  REASON FOR ASSESSMENT:  Malnutrition Screening Tool    ASSESSMENT:  Pt presented to ED with AMS after several weeks of worsening weakness and frequent falls. Pt found to be COVID19+ in ED. PMH relevant for GERD, HTN, HLD, Hx lung cancer (s/p partial lobectomy), Hx bladder cancer, DM type 2, dementia  Pt unable to provide a nutrition hx at this time secondary to dementia and confusion. SLP adjusted diet order to DYS 3 this AM for easier chewing and swallowing. RN reports that pt did not eat much at breakfast, would benefit from a nutrition supplement to augment intake and promote adequate hydration.   Nutritionally Relevant Medications: Scheduled Meds: . atorvastatin  10 mg Oral Daily  . cholecalciferol  1,000 Units Oral Daily  . insulin aspart  0-9 Units Subcutaneous TID WC  . memantine  10 mg Oral BID  . pantoprazole  40 mg Oral Daily   PRN Meds: senna-docusate  Labs reviewed:  BUN 28, creatinine 1.17  Mg 1.6  SBG ranges from 108-227 mg/dL over the last 24 hours  HgbA1c 9.4% (5/24)  NUTRITION - FOCUSED PHYSICAL EXAM: Deferred due to isolation status   Diet Order:   Diet Order            DIET DYS 3 Room service appropriate? Yes with Assist; Fluid consistency: Thin  Diet effective now                 EDUCATION NEEDS:  No education needs have been identified at this time  Skin:   Skin Assessment: Skin Integrity Issues: Skin Integrity Issues:: Other (Comment) Other: laceration to the head  Last BM:  unsure  Height:  Ht Readings from Last 1 Encounters:  02/07/21 5\' 7"  (1.702 m)   Weight:  Wt Readings from Last 1 Encounters:  02/07/21 90.7 kg   Ideal Body Weight:  61.4 kg  BMI:  Body mass index is 31.32 kg/m.  Estimated Nutritional Needs:   Kcal:  1600-1800 kcal/d  Protein:  80-90 g/d  Fluid:  >1.6 L/d   Ranell Patrick, RD, LDN Clinical Dietitian Pager on [place]

## 2021-02-08 NOTE — Plan of Care (Signed)

## 2021-02-08 NOTE — Progress Notes (Signed)
SLP Cancellation Note  Patient Details Name: Michelle Vang MRN: [id number] DOB: 23-Jun-1929   Cancelled treatment:       Reason Eval/Treat Not Completed: SLP screened, no needs identified, will sign off (chart reviewed; consulted NSG, MD and CM). Discussed w/ Team who reported pt was verbally engaging at times, and swallowing adequately, even pills w/ water per NSG this morning. Pt is on a regular consistency diet; though put in as "Soft" diet. Per NSG agreement, will modify the diet to Mech Soft for the ease of mastication secondary to Age and Baseline Dementia. Recommend general aspiration precautions including Pills in Puree if needed per NSG.  Pt converses to indicate wants/needs per NSG. Pt has Confusion; Baseline dx of Dementia. Pt lives at home w/ Caregivers, and Dtr checking on her. Noted potential plan for SLF; any further needs can be followed up w/ in the next setting.   No further skilled ST services indicated as pt appears at her baseline for communication; baseline Dementia. NSG to reconsult if any change in status.      Orinda Kenner, MS, CCC-SLP Speech Language Pathologist Rehab Services [phone] [facility] 02/08/2021, 11:33 AM

## 2021-02-08 NOTE — Progress Notes (Addendum)
Progress Note    Michelle Vang  VPX:[id number] DOB: Jun 08, 1929  DOA: 02/07/2021 PCP: Jearld Fenton, NP      Brief Narrative:    Medical records reviewed and are as summarized below:  Michelle Vang is a 87 y.o. female with past medical history of atrial fibrillation was newly diagnosed, hyperlipidemia, type 2 diabetes, hypothyroidism, history of lung cancer, bladder cancer and GERD had initially presented to cardiology clinic with increasing generalized weakness, fatigue and somnolence of about 1 week duration.  Reportedly, patient had multiple falls, about 6-8 times.  Her daughter had found her on the floor and noticed left-sided weakness and slurred speech.  She was supposed to start Eliquis and metoprolol on the day prior to admission.  She was admitted to the hospital for acute confusional state, generalized weakness, somnolence and dehydration.  Incidentally, she was found to have COVID-19 infection.      Assessment/Plan:   Principal Problem:   Falls Active Problems:   Essential hypertension   GERD   Type 2 diabetes mellitus without complication, without long-term current use of insulin (HCC)   Mild cognitive impairment with memory loss   HLD (hyperlipidemia)   Atrial fibrillation (HCC)   Lethargy   Weakness   Nutrition Problem: Increased nutrient needs Etiology: acute illness (COVID19)  Signs/Symptoms: estimated needs   Body mass index is 31.32 kg/m.  (Obesity)   Acute right MCA stroke acute confusion, somnolence: Mental status has improved.  MRI brain showed acute R MCA infarct involving the insula and lateral basal ganglia.  Small areas of acute infarct in the right frontal and parietal lobe.  Dehydration: Improved.  Discontinue IV fluids.  Persistent atrial fibrillation, recent diagnosis: CHA2DS2-VASc is 7.  Continue metoprolol and Eliquis  Generalized weakness, recurrent falls: PT and OT recommend discharge to SNF.  Hypomagnesemia: Replete with  IV magnesium sulfate.  Mild dementia, cognitive dysfunction: Continue Namenda  COVID-19 infection: No respiratory or GI symptoms.  She is tolerating room air.  No indication for treatment at this time.  She is fully vaccinated including booster dose.  Other comorbidities include probable CKD stage IIIa, GERD, hypothyroidism, type II DM, history of lung and bladder cancer in remission     Diet Order            DIET DYS 3 Room service appropriate? Yes with Assist; Fluid consistency: Thin  Diet effective now                    Consultants:  None  Procedures:  None    Medications:   .  stroke: mapping our early stages of recovery book   Does not apply Once  . apixaban  2.5 mg Oral BID  . atorvastatin  10 mg Oral Daily  . cholecalciferol  1,000 Units Oral Daily  . feeding supplement  237 mL Oral TID BM  . insulin aspart  0-9 Units Subcutaneous TID WC  . levothyroxine  88 mcg Oral Q0600  . memantine  10 mg Oral BID  . metoprolol succinate  25 mg Oral Daily  . multivitamin with minerals  1 tablet Oral Daily  . pantoprazole  40 mg Oral Daily   Continuous Infusions: . magnesium sulfate bolus IVPB       Anti-infectives (From admission, onward)   None             Family Communication/Anticipated D/C date and plan/Code Status   DVT prophylaxis: apixaban (ELIQUIS) tablet 2.5 mg Start:  02/07/21 2200 apixaban (ELIQUIS) tablet 2.5 mg     Code Status: DNR  Family Communication: [name], daughter Disposition Plan:    Status is: Observation  The patient will require care spanning > 2 midnights and should be moved to inpatient because: Unsafe d/c plan  Dispo: The patient is from: Home              Anticipated d/c is to: SNF              Patient currently is not medically stable to d/c.   Difficult to place patient No           Subjective:   Interval events noted.  She feels better today.  Her daughter was at the bedside.  Objective:     Vitals:   02/07/21 2255 02/08/21 0422 02/08/21 0802 02/08/21 1323  BP: (!) 154/88 131/64 96/75 128/77  Pulse: (!) 104 92 65 76  Resp: 18  16 16   Temp: 98 F (36.7 C) (!) 97.4 F (36.3 C)  98.6 F (37 C)  TempSrc: Oral Oral    SpO2: 100% 96% 95% 100%  Weight: 90.7 kg     Height: 5\' 7"  (1.702 m)      No data found.   Intake/Output Summary (Last 24 hours) at 02/08/2021 1358 Last data filed at 02/08/2021 0600 Gross per 24 hour  Intake 646.08 ml  Output --  Net 646.08 ml   Filed Weights   02/07/21 1406 02/07/21 2255  Weight: 90 kg 90.7 kg    Exam:  GEN: NAD SKIN: Warm and dry EYES: EOMI ENT: MMM CV: RRR PULM: CTA B ABD: soft, ND, NT, +BS CNS: AAO x 3, non focal EXT: No edema or tenderness        Data Reviewed:   I have personally reviewed following labs and imaging studies:  Labs: Labs show the following:   Basic Metabolic Panel: Recent Labs  Lab 02/05/21 1346 02/07/21 1412 02/08/21 0504  NA 137 130* 138  K 5.0 4.1 3.8  CL 102 100 107  CO2 25 20* 22  GLUCOSE 197* 227* 119*  BUN 25 29* 28*  CREATININE 1.25* 1.32* 1.17*  CALCIUM 9.7 8.7* 8.5*  MG  --   --  1.6*  PHOS  --   --  3.1   GFR Estimated Creatinine Clearance: 36.9 mL/min (A) (by C-G formula based on SCr of 1.17 mg/dL (H)). Liver Function Tests: Recent Labs  Lab 02/05/21 1346 02/07/21 1412  AST 28 51*  ALT 24 29  ALKPHOS  --  94  BILITOT 1.4* 2.4*  PROT 6.9 7.2  ALBUMIN  --  3.7   No results for input(s): LIPASE, AMYLASE in the last 168 hours. No results for input(s): AMMONIA in the last 168 hours. Coagulation profile No results for input(s): INR, PROTIME in the last 168 hours.  CBC: Recent Labs  Lab 02/05/21 1346 02/07/21 1412 02/08/21 0504  WBC 10.2 12.3* 11.1*  HGB 15.6* 15.4* 14.1  HCT 47.3* 43.6 41.9  MCV 95.2 92.8 93.3  PLT 275 278 250   Cardiac Enzymes: No results for input(s): CKTOTAL, CKMB, CKMBINDEX, TROPONINI in the last 168 hours. BNP (last 3  results) No results for input(s): PROBNP in the last 8760 hours. CBG: Recent Labs  Lab 02/07/21 2222 02/08/21 0758 02/08/21 1320  GLUCAP 181* 108* 144*   D-Dimer: No results for input(s): DDIMER in the last 72 hours. Hgb A1c: Recent Labs    02/05/21 1501  HGBA1C 9.4*  Lipid Profile: Recent Labs    02/08/21 0504  CHOL 126  HDL 35*  LDLCALC 70  TRIG 103  CHOLHDL 3.6   Thyroid function studies: Recent Labs    02/08/21 0504  TSH 1.612   Anemia work up: No results for input(s): VITAMINB12, FOLATE, FERRITIN, TIBC, IRON, RETICCTPCT in the last 72 hours. Sepsis Labs: Recent Labs  Lab 02/05/21 1346 02/07/21 1412 02/08/21 0504  WBC 10.2 12.3* 11.1*    Microbiology Recent Results (from the past 240 hour(s))  SARS CORONAVIRUS 2 (TAT 6-24 HRS) Nasopharyngeal Nasopharyngeal Swab     Status: Abnormal   Collection Time: 02/07/21  7:16 PM   Specimen: Nasopharyngeal Swab  Result Value Ref Range Status   SARS Coronavirus 2 POSITIVE (A) NEGATIVE Final    Comment: (NOTE) SARS-CoV-2 target nucleic acids are DETECTED.  The SARS-CoV-2 RNA is generally detectable in upper and lower respiratory specimens during the acute phase of infection. Positive results are indicative of the presence of SARS-CoV-2 RNA. Clinical correlation with patient history and other diagnostic information is  necessary to determine patient infection status. Positive results do not rule out bacterial infection or co-infection with other viruses.  The expected result is Negative.  Fact Sheet for Patients: SugarRoll.be  Fact Sheet for Healthcare Providers: https://www.woods-mathews.com/  This test is not yet approved or cleared by the [place] FDA and  has been authorized for detection and/or diagnosis of SARS-CoV-2 by FDA under an Emergency Use Authorization (EUA). This EUA will remain  in effect (meaning this test can be used) for the duration of  the COVID-19 declaration under Section 564(b)(1) of the Act, 21 U. S.C. section 360bbb-3(b)(1), unless the authorization is terminated or revoked sooner.   Performed at [facility], [place] [address], [place], [place] [address]     Procedures and diagnostic studies:  CT Head Wo Contrast  Result Date: 02/07/2021 CLINICAL DATA:  Generalized weakness and lethargy. EXAM: CT HEAD WITHOUT CONTRAST TECHNIQUE: Contiguous axial images were obtained from the base of the skull through the vertex without intravenous contrast. COMPARISON:  No comparison studies available. FINDINGS: Brain: There is no evidence for acute hemorrhage, hydrocephalus, mass lesion, or abnormal extra-axial fluid collection. No definite CT evidence for acute infarction. Patchy low attenuation in the deep hemispheric and periventricular white matter is nonspecific, but likely reflects chronic microvascular ischemic demyelination. Vascular: No hyperdense vessel or unexpected calcification. Skull: No evidence for fracture. No worrisome lytic or sclerotic lesion. Sinuses/Orbits: The visualized paranasal sinuses and mastoid air cells are clear. Visualized portions of the globes and intraorbital fat are unremarkable. Other: None. IMPRESSION: 1. No acute intracranial abnormality. 2. Chronic small vessel white matter ischemic disease. Electronically Signed   By: Misty Stanley M.D.   On: 02/07/2021 14:59   DG Chest Portable 1 View  Result Date: 02/07/2021 CLINICAL DATA:  Weakness.  Multiple falls.  Lethargy. EXAM: PORTABLE CHEST 1 VIEW COMPARISON:  Chest radiograph 12/12/2008 FINDINGS: Low lung volumes with bronchovascular crowding. Chain sutures noted at the right hilum with slight elevation of right hemidiaphragm. Normal heart size for technique. Minor left basilar atelectasis. No confluent consolidation, pulmonary edema, or pleural effusion. No acute osseous abnormalities are seen. IMPRESSION: Low lung volumes with bronchovascular  crowding and left basilar atelectasis. Electronically Signed   By: Keith Rake M.D.   On: 02/07/2021 17:14               LOS: 0 days   [facility]   Pager  on [email or web address]. If 7PM-7AM, please contact night-coverage at [email or web address]     02/08/2021, 1:58 PM

## 2021-02-08 NOTE — Progress Notes (Signed)
Cross coverage  COVID returned positive.  Airborne precautions and routine orders placed.  Not started on remdesivir pending discussion with daughter

## 2021-02-08 NOTE — TOC Progression Note (Signed)
Transition of Care [facility]) - Progression Note    Patient Details  Name: Michelle Vang MRN: [id number] Date of Birth: [birth year]  Transition of Care [facility]) CM/SW [place], RN Phone Number: 02/08/2021, 9:21 AM  Clinical Narrative:   As per patient's daughter, Michelle Vang.  Patient currently lives at home alone, daughter supports her as she can, has other private caregivers to fill in and assist patient as needed.  Patient reportedly has dementia and she is confused at baseline although she is more irritable now that she is in the hospital.    Daughter takes her to her appointments, no medication concerns as daughter assembles pill box.  Patient has meals on wheels.  According to daughter, patient has a walker, but refuses to use it.  Patient has not been a patient in a  in a skilled facility to date.    Patient has been weak and has had several falls, but is unable to get up, daughter believes that this is due to dizziness and weakness.   Daughter is open to Assisted Living or SNF placement for rehabilitation, and possibly a long term placement when this can occur.  Patient's daughter prefers a facility in [place] or [place].  Daughter states that it would be best to call her for needs.Michelle Vang (Daughter) [phone] (Mobile) .  TOC contact information given TOC to follow through discharge.         Expected Discharge Plan and Services                                                 Social Determinants of Health (SDOH) Interventions    Readmission Risk Interventions No flowsheet data found.

## 2021-02-08 NOTE — Progress Notes (Signed)
OT Cancellation Note  Patient Details Name: Michelle Vang MRN: [id number] DOB: [birth year]   Cancelled Treatment:    Reason Eval/Treat Not Completed: Patient at procedure or test/ unavailable  OT checked on pt twice this date for evaluation. On first trial, PT was working with patient, on second trial, MD meeting with pt. Will f/u for OT evaluation next available date/time. Thank you.  Gerrianne Scale, [place], OTR/L ascom [phone] 02/08/21, 5:54 PM

## 2021-02-08 NOTE — Plan of Care (Signed)
End of Shift Summary:  Alert and oriented x3. Remained on room air, VSS. Denies pain or n/v. IVF initiated. Urine output adequate with 1 assist to [facility].  Remained free from falls or injury. Bed low and in locked position. Call bell within reach, bed alarm on, fall mat in place. Covid (+) - isolation precautions initiated.    Problem: Education: Goal: Knowledge of General Education information will improve Description: Including pain rating scale, medication(s)/side effects and non-pharmacologic comfort measures Outcome: Progressing   Problem: Health Behavior/Discharge Planning: Goal: Ability to manage health-related needs will improve Outcome: Progressing   Problem: Clinical Measurements: Goal: Ability to maintain clinical measurements within normal limits will improve Outcome: Progressing Goal: Will remain free from infection Outcome: Progressing Goal: Diagnostic test results will improve Outcome: Progressing Goal: Respiratory complications will improve Outcome: Progressing Goal: Cardiovascular complication will be avoided Outcome: Progressing   Problem: Nutrition: Goal: Adequate nutrition will be maintained Outcome: Progressing   Problem: Coping: Goal: Level of anxiety will decrease Outcome: Progressing   Problem: Safety: Goal: Ability to remain free from injury will improve Outcome: Progressing

## 2021-02-08 NOTE — Evaluation (Signed)
Physical Therapy Evaluation Patient Details Name: Michelle Vang MRN: [id number] DOB: 1929/09/01 Today's Date: 02/08/2021   History of Present Illness  Michelle Vang is a 87 y.o. female with past medical history of atrial fibrillation was newly diagnosed, hyperlipidemia, type 2 diabetes, hypothyroidism, history of lung cancer, bladder cancer and GERD had initially presented to cardiology clinic with increasing fatigue and somnolence over the week.     Clinical Impression  Pt received in Semi-Fowler's position and agreeable to therapy.  Daughter, Michelle Vang, present in room during treatment session as well.  Pt is HOH, however if close to pt, she is able to understand more clearly and able to respond.  Pt is AxO x3 (unable to state the date or day of week).  Pt was able to perform bed-level exercises without much difficulty.  Pt then transferred to sitting EOB with good technique.  Pt does have decreased safety awareness and tends to utilize RW as support for pulling into standing position rather than support.  Pt educated on proper use of hands on bed and RW for support coming into standing.  Pt able to safely ambulate around the room, with use of FWW, but requires that support to safely navigate room.  Daughter endorses pt being more stable with AD, but when at home alone, she tends to cruise along furniture.  Pt then transferred back to bed with MD coming back into room.  D/c options were presented to both daughter and pt and was decided to attempt STR facility for increasing strength and balance of pt prior to return home.  Due to lack of support at home and not having 24/7 care, SNF is recommendation at this point.  Pt will benefit from skilled PT intervention to increase independence and safety with basic mobility in preparation for discharge to the venue listed below.       Follow Up Recommendations SNF;Supervision/Assistance - 24 hour    Equipment Recommendations  Rolling walker with 5"  wheels;3in1 (PT)    Recommendations for Other Services       Precautions / Restrictions Precautions Precautions: Fall Restrictions Weight Bearing Restrictions: No      Mobility  Bed Mobility Overal bed mobility: Modified Independent             General bed mobility comments: Pt requires slightly increased time to perform, likely due to being [facility].    Transfers Overall transfer level: Needs assistance Equipment used: Rolling walker (2 wheeled) Transfers: Sit to/from Stand Sit to Stand: Min assist         General transfer comment: Pt utilizes heavy UE support to come upright.  Pt needs stabilized RW in front in order to pull up to come into standing.  Ambulation/Gait Ambulation/Gait assistance: Supervision;Min guard Gait Distance (Feet): 35 Feet Assistive device: Rolling walker (2 wheeled) Gait Pattern/deviations: Step-through pattern Gait velocity: Decreased   General Gait Details: Pt has good technique with ambulation within room, however requires verbal cuing for hand placement on walker.  Stairs            Wheelchair Mobility    Modified Rankin (Stroke Patients Only)       Balance Overall balance assessment: Needs assistance Sitting-balance support: No upper extremity supported;Feet supported Sitting balance-Leahy Scale: Good     Standing balance support: Bilateral upper extremity supported Standing balance-Leahy Scale: Fair  Pertinent Vitals/Pain Pain Assessment: No/denies pain    Home Living Family/patient expects to be discharged to:: Private residence Living Arrangements: Alone Available Help at Discharge: Family Type of Home: House Home Access: Level entry     Home Layout: One level Home Equipment: Environmental consultant - 4 wheels      Prior Function Level of Independence: Independent with assistive device(s);Needs assistance         Comments: Mostly needs supervision with ambulation due to not  wanting to use AD's.     Hand Dominance   Dominant Hand: Right    Extremity/Trunk Assessment   Upper Extremity Assessment Upper Extremity Assessment: Overall WFL for tasks assessed    Lower Extremity Assessment Lower Extremity Assessment: Overall WFL for tasks assessed       Communication   Communication: HOH  Cognition Arousal/Alertness: Awake/alert   Overall Cognitive Status: Within Functional Limits for tasks assessed                                 General Comments: HOH but once able to understand conversation, she is able to communicate effectively.      General Comments General comments (skin integrity, edema, etc.): Pt with good stability with use of RW, but requires UE support for balance.    Exercises Total Joint Exercises Ankle Circles/Pumps: AROM;Strengthening;Both;10 reps;Supine Quad Sets: AROM;Strengthening;Both;10 reps;Supine Gluteal Sets: AROM;Strengthening;Both;10 reps Heel Slides: AROM;Strengthening;Both;10 reps;Supine Hip ABduction/ADduction: AROM;Strengthening;Both;10 reps;Supine Straight Leg Raises: AROM;Strengthening;Both;10 reps;Supine Marching in Standing: AROM;Strengthening;Both;10 reps;Standing Other Exercises Other Exercises: Pt and daughter educated on roles of PT and the services provided during hospital stay.   Assessment/Plan    PT Assessment Patient needs continued PT services  PT Problem List Decreased strength;Decreased balance;Decreased mobility;Decreased knowledge of use of DME;Decreased safety awareness       PT Treatment Interventions DME instruction;Gait training;Stair training;Functional mobility training;Therapeutic activities;Therapeutic exercise;Balance training;Neuromuscular re-education;Patient/family education    PT Goals (Current goals can be found in the Care Plan section)  Acute Rehab PT Goals Patient Stated Goal: To go home. PT Goal Formulation: With patient/family Time For Goal Achievement:  02/22/21 Potential to Achieve Goals: Good    Frequency Min 2X/week   Barriers to discharge Decreased caregiver support Pt has difficulty with safety awareness and requires verbal cuing for use of FWW.    Co-evaluation               AM-PAC PT "6 Clicks" Mobility  Outcome Measure Help needed turning from your back to your side while in a flat bed without using bedrails?: A Little Help needed moving from lying on your back to sitting on the side of a flat bed without using bedrails?: A Little Help needed moving to and from a bed to a chair (including a wheelchair)?: A Little Help needed standing up from a chair using your arms (e.g., wheelchair or bedside chair)?: A Little Help needed to walk in hospital room?: A Little Help needed climbing 3-5 steps with a railing? : A Lot 6 Click Score: 17    End of Session Equipment Utilized During Treatment: Gait belt Activity Tolerance: Patient tolerated treatment well Patient left: in bed;with bed alarm set;with family/visitor present Nurse Communication: Mobility status PT Visit Diagnosis: Unsteadiness on feet (R26.81);Other abnormalities of gait and mobility (R26.89);Repeated falls (R29.6);Muscle weakness (generalized) (M62.81);History of falling (Z91.81);Difficulty in walking, not elsewhere classified (R26.2)    Time: 9470-9628 PT Time Calculation (min) (ACUTE ONLY): 70 min  Charges:   PT Evaluation $PT Eval Low Complexity: 1 Low PT Treatments $Gait Training: 23-37 mins $Therapeutic Exercise: 23-37 mins        Gwenlyn Saran, PT, DPT 02/08/21, 1:58 PM

## 2021-02-08 NOTE — NC FL2 (Signed)
[place] LEVEL OF CARE SCREENING TOOL     IDENTIFICATION  Patient Name: Michelle Vang Birthdate: 1928/10/18 Sex: female Admission Date (Current Location): 02/07/2021  [place] and [place] Number:  Engineering geologist and Address:  [facility], [address] [place],  [address]      Provider Number: [id number]  Attending Physician Name and Address:  Jennye Boroughs, MD  Relative Name and Phone Number:  Allayne Stack (Daughter)   [phone] [facility])    Current Level of Care: Hospital Recommended Level of Care: [place] Prior Approval Number:    Date Approved/Denied:   PASRR Number: PASRR Pending  Discharge Plan: SNF    Current Diagnoses: Patient Active Problem List   Diagnosis Date Noted  . Atrial fibrillation ([place]) 02/07/2021  . Lethargy 02/07/2021  . Weakness 02/07/2021  . Falls 02/07/2021  . HLD (hyperlipidemia) 08/16/2020  . Mild cognitive impairment with memory loss 03/28/2019  . Type 2 diabetes mellitus without complication, without long-term current use of insulin ([place]) 12/22/2017  . Thyroid activity decreased 10/02/2015  . GERD 01/11/2009  . Essential hypertension 12/12/2008    Orientation RESPIRATION BLADDER Height & Weight     Self  Normal Continent Weight: 90.7 kg Height:  5\' 7"  (170.2 cm)  BEHAVIORAL SYMPTOMS/MOOD NEUROLOGICAL BOWEL NUTRITION STATUS     (Mild Cognitive Impairment) Continent Diet (Low fat)  AMBULATORY STATUS COMMUNICATION OF NEEDS Skin   Limited Assist Verbally Skin abrasions,Other (Comment) (abrasions foot, knee head; ;Laceration head)                       Personal Care Assistance Level of Assistance  Bathing,Feeding,Dressing Bathing Assistance: Limited assistance Feeding assistance: Limited assistance Dressing Assistance: Limited assistance     Functional Limitations Info  Sight,Hearing,Speech Sight Info: Adequate Hearing Info: Impaired Speech Info:  Adequate    SPECIAL CARE FACTORS FREQUENCY  PT (By licensed PT),OT (By licensed OT)     PT Frequency: min 5x weekly OT Frequency: min 5x weekly            Contractures      Additional Factors Info  Code Status,Allergies Code Status Info: DNR Allergies Info: Aricept (Donazepril)           Current Medications (02/08/2021):  This is the current hospital active medication list Current Facility-Administered Medications  Medication Dose Route Frequency Provider Last Rate Last Admin  .  stroke: mapping our early stages of recovery book   Does not apply Once Pokhrel, Laxman, MD      . acetaminophen (TYLENOL) tablet 650 mg  650 mg Oral Q4H PRN Pokhrel, Laxman, MD       Or  . acetaminophen (TYLENOL) 160 MG/5ML solution 650 mg  650 mg Per Tube Q4H PRN Pokhrel, Laxman, MD       Or  . acetaminophen (TYLENOL) suppository 650 mg  650 mg Rectal Q4H PRN Pokhrel, Laxman, MD      . apixaban (ELIQUIS) tablet 2.5 mg  2.5 mg Oral BID Pokhrel, Laxman, MD   2.5 mg at 02/08/21 1030  . atorvastatin (LIPITOR) tablet 10 mg  10 mg Oral Daily Pokhrel, Laxman, MD   10 mg at 02/08/21 1031  . cholecalciferol (VITAMIN D) tablet 1,000 Units  1,000 Units Oral Daily Pokhrel, Laxman, MD   1,000 Units at 02/08/21 1030  . feeding supplement (ENSURE ENLIVE / ENSURE PLUS) liquid 237 mL  237 mL Oral TID BM Jennye Boroughs, MD   237 mL  at 02/08/21 1423  . insulin aspart (novoLOG) injection 0-9 Units  0-9 Units Subcutaneous TID WC Pokhrel, Laxman, MD   2 Units at 02/07/21 2235  . levothyroxine (SYNTHROID) tablet 88 mcg  88 mcg Oral Q0600 Flora Lipps, MD   88 mcg at 02/08/21 0513  . magnesium sulfate IVPB 2 g 50 mL  2 g Intravenous Once Pernell Dupre, RPH 50 mL/hr at 02/08/21 1453 2 g at 02/08/21 1453  . memantine (NAMENDA) tablet 10 mg  10 mg Oral BID Pokhrel, Laxman, MD   10 mg at 02/08/21 1032  . metoprolol succinate (TOPROL-XL) 24 hr tablet 25 mg  25 mg Oral Daily Pokhrel, Laxman, MD   25 mg at 02/08/21 1031   . multivitamin with minerals tablet 1 tablet  1 tablet Oral Daily Jennye Boroughs, MD      . pantoprazole (PROTONIX) EC tablet 40 mg  40 mg Oral Daily Pokhrel, Laxman, MD   40 mg at 02/08/21 1031  . senna-docusate (Senokot-S) tablet 1 tablet  1 tablet Oral QHS PRN Pokhrel, Laxman, MD         Discharge Medications: Please see discharge summary for a list of discharge medications.  Relevant Imaging Results:  Relevant Lab Results:   Additional Information SSN [id number]  Pete Pelt, RN

## 2021-02-09 DIAGNOSIS — U071 COVID-19: Secondary | ICD-10-CM | POA: Diagnosis present

## 2021-02-09 DIAGNOSIS — I639 Cerebral infarction, unspecified: Secondary | ICD-10-CM

## 2021-02-09 DIAGNOSIS — Z8551 Personal history of malignant neoplasm of bladder: Secondary | ICD-10-CM | POA: Diagnosis not present

## 2021-02-09 DIAGNOSIS — Z6831 Body mass index (BMI) 31.0-31.9, adult: Secondary | ICD-10-CM | POA: Diagnosis not present

## 2021-02-09 DIAGNOSIS — F0390 Unspecified dementia without behavioral disturbance: Secondary | ICD-10-CM | POA: Diagnosis present

## 2021-02-09 DIAGNOSIS — Z66 Do not resuscitate: Secondary | ICD-10-CM | POA: Diagnosis present

## 2021-02-09 DIAGNOSIS — I63511 Cerebral infarction due to unspecified occlusion or stenosis of right middle cerebral artery: Secondary | ICD-10-CM | POA: Diagnosis present

## 2021-02-09 DIAGNOSIS — E1122 Type 2 diabetes mellitus with diabetic chronic kidney disease: Secondary | ICD-10-CM | POA: Diagnosis present

## 2021-02-09 DIAGNOSIS — R531 Weakness: Secondary | ICD-10-CM | POA: Diagnosis not present

## 2021-02-09 DIAGNOSIS — E871 Hypo-osmolality and hyponatremia: Secondary | ICD-10-CM | POA: Diagnosis present

## 2021-02-09 DIAGNOSIS — I129 Hypertensive chronic kidney disease with stage 1 through stage 4 chronic kidney disease, or unspecified chronic kidney disease: Secondary | ICD-10-CM | POA: Diagnosis present

## 2021-02-09 DIAGNOSIS — Z7901 Long term (current) use of anticoagulants: Secondary | ICD-10-CM | POA: Diagnosis not present

## 2021-02-09 DIAGNOSIS — I10 Essential (primary) hypertension: Secondary | ICD-10-CM | POA: Diagnosis not present

## 2021-02-09 DIAGNOSIS — K219 Gastro-esophageal reflux disease without esophagitis: Secondary | ICD-10-CM | POA: Diagnosis present

## 2021-02-09 DIAGNOSIS — W19XXXD Unspecified fall, subsequent encounter: Secondary | ICD-10-CM | POA: Diagnosis not present

## 2021-02-09 DIAGNOSIS — E785 Hyperlipidemia, unspecified: Secondary | ICD-10-CM | POA: Diagnosis present

## 2021-02-09 DIAGNOSIS — I4819 Other persistent atrial fibrillation: Secondary | ICD-10-CM | POA: Diagnosis present

## 2021-02-09 DIAGNOSIS — Z85118 Personal history of other malignant neoplasm of bronchus and lung: Secondary | ICD-10-CM | POA: Diagnosis not present

## 2021-02-09 DIAGNOSIS — F05 Delirium due to known physiological condition: Secondary | ICD-10-CM | POA: Diagnosis present

## 2021-02-09 DIAGNOSIS — R5383 Other fatigue: Secondary | ICD-10-CM | POA: Diagnosis not present

## 2021-02-09 DIAGNOSIS — Z85820 Personal history of malignant melanoma of skin: Secondary | ICD-10-CM | POA: Diagnosis not present

## 2021-02-09 DIAGNOSIS — E860 Dehydration: Secondary | ICD-10-CM | POA: Diagnosis present

## 2021-02-09 DIAGNOSIS — R296 Repeated falls: Secondary | ICD-10-CM | POA: Diagnosis present

## 2021-02-09 DIAGNOSIS — E669 Obesity, unspecified: Secondary | ICD-10-CM | POA: Diagnosis present

## 2021-02-09 DIAGNOSIS — G8194 Hemiplegia, unspecified affecting left nondominant side: Secondary | ICD-10-CM | POA: Diagnosis present

## 2021-02-09 DIAGNOSIS — E039 Hypothyroidism, unspecified: Secondary | ICD-10-CM | POA: Diagnosis present

## 2021-02-09 DIAGNOSIS — N1831 Chronic kidney disease, stage 3a: Secondary | ICD-10-CM | POA: Diagnosis present

## 2021-02-09 DIAGNOSIS — Z8673 Personal history of transient ischemic attack (TIA), and cerebral infarction without residual deficits: Secondary | ICD-10-CM | POA: Diagnosis present

## 2021-02-09 DIAGNOSIS — Z888 Allergy status to other drugs, medicaments and biological substances status: Secondary | ICD-10-CM | POA: Diagnosis not present

## 2021-02-09 LAB — BASIC METABOLIC PANEL
Anion gap: 6 (ref 5–15)
BUN: 29 mg/dL — ABNORMAL HIGH (ref 8–23)
CO2: 23 mmol/L (ref 22–32)
Calcium: 8.6 mg/dL — ABNORMAL LOW (ref 8.9–10.3)
Chloride: 111 mmol/L (ref 98–111)
Creatinine, Ser: 1.1 mg/dL — ABNORMAL HIGH (ref 0.44–1.00)
GFR, Estimated: 48 mL/min — ABNORMAL LOW (ref >60)
Glucose, Bld: 149 mg/dL — ABNORMAL HIGH (ref 70–99)
Potassium: 3.8 mmol/L (ref 3.5–5.1)
Sodium: 140 mmol/L (ref 135–145)

## 2021-02-09 LAB — CBC WITH DIFFERENTIAL/PLATELET
Abs Immature Granulocytes: 0.07 10*3/uL (ref 0.00–0.07)
Basophils Absolute: 0.1 10*3/uL (ref 0.0–0.1)
Basophils Relative: 1 %
Eosinophils Absolute: 0.2 10*3/uL (ref 0.0–0.5)
Eosinophils Relative: 3 %
HCT: 38.2 % (ref 36.0–46.0)
Hemoglobin: 13.1 g/dL (ref 12.0–15.0)
Immature Granulocytes: 1 %
Lymphocytes Relative: 27 %
Lymphs Abs: 2 10*3/uL (ref 0.7–4.0)
MCH: 32.6 pg (ref 26.0–34.0)
MCHC: 34.3 g/dL (ref 30.0–36.0)
MCV: 95 fL (ref 80.0–100.0)
Monocytes Absolute: 1.1 10*3/uL — ABNORMAL HIGH (ref 0.1–1.0)
Monocytes Relative: 15 %
Neutro Abs: 4 10*3/uL (ref 1.7–7.7)
Neutrophils Relative %: 53 %
Platelets: 231 10*3/uL (ref 150–400)
RBC: 4.02 MIL/uL (ref 3.87–5.11)
RDW: 12.7 % (ref 11.5–15.5)
WBC: 7.4 10*3/uL (ref 4.0–10.5)
nRBC: 0 % (ref 0.0–0.2)

## 2021-02-09 LAB — MAGNESIUM: Magnesium: 1.9 mg/dL (ref 1.7–2.4)

## 2021-02-09 LAB — URINE CULTURE: Culture: NO GROWTH

## 2021-02-09 LAB — GLUCOSE, CAPILLARY
Glucose-Capillary: 148 mg/dL — ABNORMAL HIGH (ref 70–99)
Glucose-Capillary: 214 mg/dL — ABNORMAL HIGH (ref 70–99)
Glucose-Capillary: 193 mg/dL — ABNORMAL HIGH (ref 70–99)

## 2021-02-09 MED ORDER — APIXABAN 5 MG PO TABS
5.0000 mg | ORAL_TABLET | Freq: Two times a day (BID) | ORAL | Status: DC
  Administered 2021-02-09 – 2021-02-14 (×10): 5 mg via ORAL
  Filled 2021-02-09 (×11): qty 1

## 2021-02-09 NOTE — Progress Notes (Signed)
Patient's daughter [name] here to visit for awhile. Daughter updated on POC, agreeable.

## 2021-02-09 NOTE — Evaluation (Signed)
Occupational Therapy Evaluation Patient Details Name: Michelle Vang MRN: [id number] DOB: 23-Jan-1929 Today's Date: 02/09/2021    History of Present Illness Michelle Vang is a 87 y.o. female with past medical history of atrial fibrillation was newly diagnosed, hyperlipidemia, type 2 diabetes, hypothyroidism, history of lung cancer, bladder cancer and GERD had initially presented to cardiology clinic with increasing fatigue and somnolence over the week. In addition, several recent falls and weakness with slurred speech. Adm for acute CVA and incidentally found to have COVID.   Clinical Impression   Pt seen for OT evaluation this date in setting of acute hospilization d/t CVA and COVID. Pt reports living alone and being INDEP at baseline. Pt presents this date with weakness and decreased fxl activity tolerance impacting her ability to safely complete ADLs/ADL mobility. Pt requires CGA/MIN A for ADL transfers with RW, MIN A for seated LB ADLs and SETUP for seated UB ADLs. OT educates pt re: role and safe use of RW including hand placement cues and rationale. Pt with moderate reception seemingly, but poor return demonstration, possibly limited d/t being HOH. Pt left in bed with all needs met and in reach, bed alarm activated and tele-sitter. Will continue to follow and anticipate that pt will require STR in SNF setting d/t needing increased assistance in most aspects of ADLs/ADL mobility for fall prevention 2/2 weakness.    Follow Up Recommendations  SNF    Equipment Recommendations  3 in 1 bedside commode;Tub/shower seat    Recommendations for Other Services       Precautions / Restrictions Precautions Precautions: Fall Restrictions Weight Bearing Restrictions: No      Mobility Bed Mobility Overal bed mobility: Modified Independent             General bed mobility comments: Pt requires slightly increased time to perform, likely due to being [facility]    Transfers Overall transfer  level: Needs assistance Equipment used: Rolling walker (2 wheeled) Transfers: Sit to/from Stand Sit to Stand: Min assist;Min guard         General transfer comment: heavy reliance on UE support on RW, cues for safe hand placement on RW.    Balance Overall balance assessment: Needs assistance Sitting-balance support: No upper extremity supported;Feet supported Sitting balance-Leahy Scale: Good     Standing balance support: Bilateral upper extremity supported Standing balance-Leahy Scale: Fair Standing balance comment: reliant on UE support                           ADL either performed or assessed with clinical judgement   ADL Overall ADL's : Needs assistance/impaired                                       General ADL Comments: SETUP for seated UB ADLs, MIN A for seated LB ADLs, CGA/MIN A for ADL transfers with RW. (cues to use RW safely)     Vision Patient Visual Report: No change from baseline       Perception     Praxis      Pertinent Vitals/Pain Pain Assessment: No/denies pain     Hand Dominance Right   Extremity/Trunk Assessment Upper Extremity Assessment Upper Extremity Assessment: Generalized weakness (shld ROM limited with R (~1/2 range) worse than L (~2/3 range), MMT grossly 4-/5 bilaterally)   Lower Extremity Assessment Lower Extremity Assessment: Overall WFL for tasks  assessed;Generalized weakness (ROM WFL for ADLs, some weakness noted in standing with knees slightly flexed and reliant on UE support on RW.)       Communication Communication Communication: HOH   Cognition Arousal/Alertness: Awake/alert Behavior During Therapy: WFL for tasks assessed/performed Overall Cognitive Status: Difficult to assess                                 General Comments: Pt able to follow commands when she hears therapist, demos some slow processing, but overall approriate. She is oriented, but demos some impulsivity and  decreased safety awareness.   General Comments       Exercises Other Exercises Other Exercises: Ed re: role of OT in acute setting, pt with moderate reception, also educated on safe use of RW with minimal understanding on return demonstration, possibly r/t not being able to hear therapist.   Shoulder Instructions      Home Living Family/patient expects to be discharged to:: Private residence Living Arrangements: Alone Available Help at Discharge: Family;Available PRN/intermittently (dtr) Type of Home: House Home Access: Level entry     Home Layout: One level     Bathroom Shower/Tub: Teacher, early years/pre: Standard     Home Equipment: Walker - 4 wheels          Prior Functioning/Environment Level of Independence: Independent with assistive device(s);Needs assistance        Comments: Mostly needs supervision with ambulation due to not wanting to use AD's.        OT Problem List: Decreased strength;Decreased range of motion;Decreased activity tolerance;Impaired balance (sitting and/or standing);Decreased safety awareness;Decreased knowledge of use of DME or AE      OT Treatment/Interventions: Self-care/ADL training;DME and/or AE instruction;Therapeutic activities;Balance training;Therapeutic exercise;Energy conservation;Patient/family education    OT Goals(Current goals can be found in the care plan section) Acute Rehab OT Goals Patient Stated Goal: To go home. OT Goal Formulation: With patient Time For Goal Achievement: 02/23/21 Potential to Achieve Goals: Good ADL Goals Pt Will Perform Lower Body Bathing: with supervision;sit to/from stand Pt Will Perform Lower Body Dressing: with supervision;sit to/from stand (with LRAD PRN) Pt Will Transfer to Toilet: with supervision;ambulating;grab bars (with LRAD to/from restroom) Pt Will Perform Toileting - Clothing Manipulation and hygiene: with supervision;sit to/from stand Pt/caregiver will Perform Home  Exercise Program: Increased strength;Both right and left upper extremity;With Supervision Additional ADL Goal #1: Pt will demo good safety awareness with use of RW for standing ADLs/fxl mobility with no verbal cues.  OT Frequency: Min 1X/week   Barriers to D/C:            Co-evaluation              AM-PAC OT "6 Clicks" Daily Activity     Outcome Measure Help from another person eating meals?: None Help from another person taking care of personal grooming?: A Little Help from another person toileting, which includes using toliet, bedpan, or urinal?: A Little Help from another person bathing (including washing, rinsing, drying)?: A Little Help from another person to put on and taking off regular upper body clothing?: A Little Help from another person to put on and taking off regular lower body clothing?: A Little 6 Click Score: 19   End of Session Equipment Utilized During Treatment: Gait belt;Rolling walker Nurse Communication: Mobility status  Activity Tolerance: Patient tolerated treatment well Patient left: in bed;with call bell/phone within reach;with bed alarm set;Other (  comment) (telesitter)  OT Visit Diagnosis: Unsteadiness on feet (R26.81);Muscle weakness (generalized) (M62.81)                Time: 9675-9163 OT Time Calculation (min): 14 min Charges:  OT General Charges $OT Visit: 1 Visit OT Evaluation $OT Eval Moderate Complexity: [place], [place], OTR/L ascom [phone] 02/09/21, 4:39 PM

## 2021-02-09 NOTE — Progress Notes (Signed)
Patient mod assist x1 to [facility]. Pt gait unsteady, holding on to objects. Pt states she does not use a walker or other assistance device at home. Limited safety awareness. Pt very HOH, left ear better than right.

## 2021-02-09 NOTE — Progress Notes (Addendum)
Progress Note    Michelle Vang  LNL:[id number] DOB: Dec 05, 1928  DOA: 02/07/2021 PCP: Jearld Fenton, NP      Brief Narrative:    Medical records reviewed and are as summarized below:  Michelle Vang is a 87 y.o. female with past medical history of atrial fibrillation was newly diagnosed, hyperlipidemia, type 2 diabetes, hypothyroidism, history of lung cancer, bladder cancer and GERD had initially presented to cardiology clinic with increasing generalized weakness, fatigue and somnolence of about 1 week duration.  Reportedly, patient had multiple falls, about 6-8 times.  Her daughter had found her on the floor and noticed left-sided weakness and slurred speech.  She was supposed to start Eliquis and metoprolol on the day prior to admission.  She was admitted to the hospital for acute confusional state, generalized weakness, somnolence and dehydration.  Incidentally, she was found to have COVID-19 infection.      Assessment/Plan:   Principal Problem:   Acute CVA (cerebrovascular accident) [facility]) Active Problems:   Essential hypertension   GERD   Type 2 diabetes mellitus without complication, without long-term current use of insulin (HCC)   Mild cognitive impairment with memory loss   HLD (hyperlipidemia)   Atrial fibrillation (HCC)   Lethargy   Weakness   Falls   Nutrition Problem: Increased nutrient needs Etiology: acute illness (COVID19)  Signs/Symptoms: estimated needs   Body mass index is 31.32 kg/m.  (Obesity)   Acute right MCA stroke, acute confusion, somnolence: Mental status was improved to baseline.  MRI brain showed acute R MCA infarct involving the insula and lateral basal ganglia.  Small areas of acute infarct in the right frontal and parietal lobe.  Continue PT and OT.  Dehydration: Improved.    Persistent atrial fibrillation, recent diagnosis: CHA2DS2-VASc is 7.  Continue metoprolol and Eliquis.  Generalized weakness, recurrent falls: PT and OT  recommend discharge to SNF.  Hypomagnesemia: Improved  Mild dementia, cognitive dysfunction: Continue Namenda  COVID-19 infection: No respiratory or GI symptoms.  She is tolerating room air.  No indication for treatment at this time.  She is fully vaccinated including booster dose.  Other comorbidities include probable CKD stage IIIa, GERD, hypothyroidism, type II DM, history of lung and bladder cancer in remission  Awaiting placement to SNF.  Follow-up with social worker to assist with disposition.   Diet Order            DIET DYS 3 Room service appropriate? No; Fluid consistency: Thin  Diet effective now                    Consultants:  None  Procedures:  None    Medications:   .  stroke: mapping our early stages of recovery book   Does not apply Once  . apixaban  5 mg Oral BID  . atorvastatin  10 mg Oral Daily  . cholecalciferol  1,000 Units Oral Daily  . feeding supplement  237 mL Oral TID BM  . insulin aspart  0-9 Units Subcutaneous TID WC  . levothyroxine  88 mcg Oral Q0600  . memantine  10 mg Oral BID  . metoprolol succinate  25 mg Oral Daily  . multivitamin with minerals  1 tablet Oral Daily  . pantoprazole  40 mg Oral Daily   Continuous Infusions:    Anti-infectives (From admission, onward)   None             Family Communication/Anticipated D/C date and plan/Code Status  DVT prophylaxis:  apixaban (ELIQUIS) tablet 5 mg     Code Status: DNR  Family Communication: Plan discussed with her daughter, Michelle Vang. Disposition Plan:    Status is: Observation  The patient will require care spanning > 2 midnights and should be moved to inpatient because: Unsafe d/c plan  Dispo: The patient is from: Home              Anticipated d/c is to: SNF              Patient currently is not medically stable to d/c.   Difficult to place patient No           Subjective:   Interval events noted.  She has no complaints.  No unilateral  weakness or numbness in extremities, no changes in speech or vision.  Objective:    Vitals:   02/08/21 2320 02/09/21 0430 02/09/21 0747 02/09/21 0840  BP: 100/68 (!) 128/93 128/84 111/72  Pulse: 60 (!) 57 75 82  Resp: 16 16 16 20   Temp: 83.3 F (36.9 C) (!) 97.4 F (36.3 C) (!) 97.5 F (36.4 C) 98.2 F (36.8 C)  TempSrc:  Oral Oral   SpO2: 100% 100% 98% 98%  Weight:      Height:       No data found.   Intake/Output Summary (Last 24 hours) at 02/09/2021 1116 Last data filed at 02/09/2021 0915 Gross per 24 hour  Intake 650 ml  Output 200 ml  Net 450 ml   Filed Weights   02/07/21 1406 02/07/21 2255  Weight: 90 kg 90.7 kg    Exam:  GEN: NAD SKIN: Warm and dry EYES: EOMI ENT: MMM CV: RRR PULM: CTA B ABD: soft, ND, NT, +BS CNS: AAO x 3, she was able to tell me the time on the clock.  Non focal EXT: No edema or tenderness       Data Reviewed:   I have personally reviewed following labs and imaging studies:  Labs: Labs show the following:   Basic Metabolic Panel: Recent Labs  Lab 02/05/21 1346 02/07/21 1412 02/08/21 0504 02/09/21 0538  NA 137 130* 138 140  K 5.0 4.1 3.8 3.8  CL 102 100 107 111  CO2 25 20* 22 23  GLUCOSE 197* 227* 119* 149*  BUN 25 29* 28* 29*  CREATININE 1.25* 1.32* 1.17* 1.10*  CALCIUM 9.7 8.7* 8.5* 8.6*  MG  --   --  1.6* 1.9  PHOS  --   --  3.1  --    GFR Estimated Creatinine Clearance: 39.3 mL/min (A) (by C-G formula based on SCr of 1.1 mg/dL (H)). Liver Function Tests: Recent Labs  Lab 02/05/21 1346 02/07/21 1412  AST 28 51*  ALT 24 29  ALKPHOS  --  94  BILITOT 1.4* 2.4*  PROT 6.9 7.2  ALBUMIN  --  3.7   No results for input(s): LIPASE, AMYLASE in the last 168 hours. No results for input(s): AMMONIA in the last 168 hours. Coagulation profile No results for input(s): INR, PROTIME in the last 168 hours.  CBC: Recent Labs  Lab 02/05/21 1346 02/07/21 1412 02/08/21 0504 02/09/21 0538  WBC 10.2 12.3* 11.1* 7.4   NEUTROABS  --   --   --  4.0  HGB 15.6* 15.4* 14.1 13.1  HCT 47.3* 43.6 41.9 38.2  MCV 95.2 92.8 93.3 95.0  PLT 275 278 250 231   Cardiac Enzymes: No results for input(s): CKTOTAL, CKMB, CKMBINDEX, TROPONINI in the last  168 hours. BNP (last 3 results) No results for input(s): PROBNP in the last 8760 hours. CBG: Recent Labs  Lab 02/07/21 2222 02/08/21 0758 02/08/21 1320 02/08/21 2319 02/09/21 0749  GLUCAP 181* 108* 144* 188* 148*   D-Dimer: No results for input(s): DDIMER in the last 72 hours. Hgb A1c: No results for input(s): HGBA1C in the last 72 hours. Lipid Profile: Recent Labs    02/08/21 0504  CHOL 126  HDL 35*  LDLCALC 70  TRIG 103  CHOLHDL 3.6   Thyroid function studies: Recent Labs    02/08/21 0504  TSH 1.612   Anemia work up: No results for input(s): VITAMINB12, FOLATE, FERRITIN, TIBC, IRON, RETICCTPCT in the last 72 hours. Sepsis Labs: Recent Labs  Lab 02/05/21 1346 02/07/21 1412 02/08/21 0504 02/09/21 0538  WBC 10.2 12.3* 11.1* 7.4    Microbiology Recent Results (from the past 240 hour(s))  Urine Culture     Status: None   Collection Time: 02/07/21  5:27 PM   Specimen: Urine, Random  Result Value Ref Range Status   Specimen Description   Final    URINE, RANDOM Performed at [facility], [address], [place], [place] [address]    Special Requests   Final    NONE Performed at [facility], [address] [place], [place] [address]    Culture   Final    NO GROWTH Performed at [facility], [place] [address], [place], [place] [address]    Report Status 02/09/2021 FINAL  Final  SARS CORONAVIRUS 2 (TAT 6-24 HRS) Nasopharyngeal Nasopharyngeal Swab     Status: Abnormal   Collection Time: 02/07/21  7:16 PM   Specimen: Nasopharyngeal Swab  Result Value Ref Range Status   SARS Coronavirus 2 POSITIVE (A) NEGATIVE Final    Comment: (NOTE) SARS-CoV-2 target nucleic acids are DETECTED.  The SARS-CoV-2 RNA is  generally detectable in upper and lower respiratory specimens during the acute phase of infection. Positive results are indicative of the presence of SARS-CoV-2 RNA. Clinical correlation with patient history and other diagnostic information is  necessary to determine patient infection status. Positive results do not rule out bacterial infection or co-infection with other viruses.  The expected result is Negative.  Fact Sheet for Patients: SugarRoll.be  Fact Sheet for Healthcare Providers: https://www.woods-mathews.com/  This test is not yet approved or cleared by the [place] FDA and  has been authorized for detection and/or diagnosis of SARS-CoV-2 by FDA under an Emergency Use Authorization (EUA). This EUA will remain  in effect (meaning this test can be used) for the duration of the COVID-19 declaration under Section 564(b)(1) of the Act, 21 U. S.C. section 360bbb-3(b)(1), unless the authorization is terminated or revoked sooner.   Performed at [facility], [place] [address], [place], [place] [address]     Procedures and diagnostic studies:  CT Head Wo Contrast  Result Date: 02/07/2021 CLINICAL DATA:  Generalized weakness and lethargy. EXAM: CT HEAD WITHOUT CONTRAST TECHNIQUE: Contiguous axial images were obtained from the base of the skull through the vertex without intravenous contrast. COMPARISON:  No comparison studies available. FINDINGS: Brain: There is no evidence for acute hemorrhage, hydrocephalus, mass lesion, or abnormal extra-axial fluid collection. No definite CT evidence for acute infarction. Patchy low attenuation in the deep hemispheric and periventricular white matter is nonspecific, but likely reflects chronic microvascular ischemic demyelination. Vascular: No hyperdense vessel or unexpected calcification. Skull: No evidence for fracture. No worrisome lytic or sclerotic lesion. Sinuses/Orbits:  The visualized  paranasal sinuses and mastoid air cells are clear. Visualized portions of the globes and intraorbital fat are unremarkable. Other: None. IMPRESSION: 1. No acute intracranial abnormality. 2. Chronic small vessel white matter ischemic disease. Electronically Signed   By: Misty Stanley M.D.   On: 02/07/2021 14:59   MR BRAIN WO CONTRAST  Result Date: 02/08/2021 CLINICAL DATA:  Acute neuro deficit. Left-sided weakness and atrial fibrillation. EXAM: MRI HEAD WITHOUT CONTRAST TECHNIQUE: Multiplanar, multiecho pulse sequences of the brain and surrounding structures were obtained without intravenous contrast. COMPARISON:  CT head 02/07/2021 FINDINGS: Brain: Acute infarct in the right insular cortex and subinsular white matter. These areas show hyperintensity on FLAIR. Additional small areas of acute infarct in the right frontal and parietal cortex and right parietal white matter. Generalized atrophy. Mild white matter changes elsewhere. Small chronic infarct right thalamus. No hemorrhage or mass. Negative for hydrocephalus. Vascular: Normal arterial flow voids. Skull and upper cervical spine: Negative Sinuses/Orbits: Mild mucosal edema paranasal sinuses. Negative orbit Other: None IMPRESSION: Acute right MCA infarct involving the insula and lateral basal ganglia. Small areas of acute infarct in the right frontal and parietal lobe. Generalized atrophy. Electronically Signed   By: Franchot Gallo M.D.   On: 02/08/2021 14:13   US Carotid Bilateral (at [facility] and [facility] only)  Result Date: 02/09/2021 CLINICAL DATA:  CVA. History of hypertension, hyperlipidemia and diabetes. EXAM: BILATERAL CAROTID DUPLEX ULTRASOUND TECHNIQUE: Pearline Cables scale imaging, color Doppler and duplex ultrasound were performed of bilateral carotid and vertebral arteries in the neck. COMPARISON:  None. FINDINGS: Criteria: Quantification of carotid stenosis is based on velocity parameters that correlate the residual internal carotid diameter with NASCET-based  stenosis levels, using the diameter of the distal internal carotid lumen as the denominator for stenosis measurement. The following velocity measurements were obtained: RIGHT ICA: 61/21 cm/sec CCA: 62/22 cm/sec SYSTOLIC ICA/CCA RATIO:  1.0 ECA: 74 cm/sec LEFT ICA: 75/20 cm/sec CCA: 97/98 cm/sec SYSTOLIC ICA/CCA RATIO:  1.1 ECA: 77 cm/sec RIGHT CAROTID ARTERY: There is a minimal amount of circumferential intimal thickening involving the right common carotid artery (images 2, 3 and 4). There is a minimal amount of eccentric echogenic plaque within the right carotid bulb (image 6 and 17), extending to involve the origin and proximal aspects the right internal carotid artery (image 22), not resulting in elevated peak systolic velocities within the interrogated course of the right internal carotid artery to suggest a hemodynamically significant stenosis RIGHT VERTEBRAL ARTERY:  Antegrade flow LEFT CAROTID ARTERY: There is a minimal amount of intimal thickening involving the distal aspect the left common carotid artery (image 35). There is a minimal amount of eccentric echogenic plaque within the left carotid bulb (images 36 and 48), extending to involve the origin and proximal aspects of the left internal carotid artery (image 53), not resulting in elevated peak systolic velocities within the interrogated course of the left internal carotid artery to suggest a hemodynamically significant stenosis. LEFT VERTEBRAL ARTERY:  Antegrade flow IMPRESSION: Minimal amount of bilateral atherosclerotic plaque, right subjectively greater than left, not resulting in a hemodynamically significant stenosis within either internal carotid artery. Electronically Signed   By: Sandi Mariscal M.D.   On: 02/09/2021 08:51   DG Chest Portable 1 View  Result Date: 02/07/2021 CLINICAL DATA:  Weakness.  Multiple falls.  Lethargy. EXAM: PORTABLE CHEST 1 VIEW COMPARISON:  Chest radiograph 12/12/2008 FINDINGS: Low lung volumes with bronchovascular  crowding. Chain sutures noted at the right hilum with slight elevation of right hemidiaphragm.  Normal heart size for technique. Minor left basilar atelectasis. No confluent consolidation, pulmonary edema, or pleural effusion. No acute osseous abnormalities are seen. IMPRESSION: Low lung volumes with bronchovascular crowding and left basilar atelectasis. Electronically Signed   By: Keith Rake M.D.   On: 02/07/2021 17:14               LOS: 0 days   Princessa Lesmeister  [facility]   Pager on [email or web address]. If 7PM-7AM, please contact night-coverage at [email or web address]     02/09/2021, 11:16 AM

## 2021-02-10 DIAGNOSIS — I639 Cerebral infarction, unspecified: Secondary | ICD-10-CM | POA: Diagnosis not present

## 2021-02-10 LAB — GLUCOSE, CAPILLARY
Glucose-Capillary: 170 mg/dL — ABNORMAL HIGH (ref 70–99)
Glucose-Capillary: 177 mg/dL — ABNORMAL HIGH (ref 70–99)
Glucose-Capillary: 334 mg/dL — ABNORMAL HIGH (ref 70–99)

## 2021-02-10 MED ORDER — FLUOCINONIDE 0.05 % EX SOLN
1.0000 "application " | CUTANEOUS | Status: DC | PRN
  Filled 2021-02-10: qty 60

## 2021-02-10 MED ORDER — MELATONIN 5 MG PO TABS
5.0000 mg | ORAL_TABLET | Freq: Once | ORAL | Status: AC
  Administered 2021-02-10: 5 mg via ORAL
  Filled 2021-02-10 (×2): qty 1

## 2021-02-10 MED ORDER — BUSPIRONE HCL 5 MG PO TABS
5.0000 mg | ORAL_TABLET | Freq: Two times a day (BID) | ORAL | Status: DC
  Administered 2021-02-10 – 2021-02-14 (×9): 5 mg via ORAL
  Filled 2021-02-10 (×11): qty 1

## 2021-02-10 MED ORDER — HALOPERIDOL 0.5 MG PO TABS
0.5000 mg | ORAL_TABLET | Freq: Two times a day (BID) | ORAL | Status: AC
  Administered 2021-02-10 (×2): 0.5 mg via ORAL
  Filled 2021-02-10 (×2): qty 1

## 2021-02-10 NOTE — Progress Notes (Signed)
Progress Note    [place]  FVC:[id number] DOB: 1928/12/11  DOA: 02/07/2021 PCP: Jearld Fenton, NP      Brief Narrative:    Medical records reviewed and are as summarized below:  Michelle Vang is a 87 y.o. female with past medical history of atrial fibrillation was newly diagnosed, hyperlipidemia, type 2 diabetes, hypothyroidism, history of lung cancer, bladder cancer and GERD had initially presented to cardiology clinic with increasing generalized weakness, fatigue and somnolence of about 1 week duration.  Reportedly, patient had multiple falls, about 6-8 times.  Her daughter had found her on the floor and noticed left-sided weakness and slurred speech.  She was supposed to start Eliquis and metoprolol on the day prior to admission.  She was admitted to the hospital for acute confusional state, generalized weakness, somnolence and dehydration.  Incidentally, she was found to have COVID-19 infection.      Assessment/Plan:   Principal Problem:   Acute CVA (cerebrovascular accident) [facility]) Active Problems:   Essential hypertension   GERD   Type 2 diabetes mellitus without complication, without long-term current use of insulin (HCC)   Mild cognitive impairment with memory loss   HLD (hyperlipidemia)   Atrial fibrillation (HCC)   Lethargy   Weakness   Falls   Nutrition Problem: Increased nutrient needs Etiology: acute illness (COVID19)  Signs/Symptoms: estimated needs   Body mass index is 31.32 kg/m.  (Obesity)   Acute right MCA stroke, acute confusion, somnolence: MRI brain showed acute R MCA infarct involving the insula and lateral basal ganglia.  Small areas of acute infarct in the right frontal and parietal lobe.  Continue PT and OT.  Continue Eliquis.  Dehydration: Improved.    Persistent atrial fibrillation, recent diagnosis: CHA2DS2-VASc is 7.  Continue metoprolol and Eliquis.  Generalized weakness, recurrent falls: PT and OT recommend discharge to  SNF.  Hypomagnesemia: Improved  Mild dementia, cognitive dysfunction: Intermittent disorientation/confusion and anxiety.  Start BuSpar and low-dose Haldol (2 doses for now).  Continue Namenda.  COVID-19 infection: No respiratory or GI symptoms.  She is tolerating room air.  No indication for treatment at this time.  She is fully vaccinated including booster dose.  Other comorbidities include probable CKD stage IIIa, GERD, hypothyroidism, type II DM, history of lung and bladder cancer in remission  Awaiting placement to SNF.  Follow-up with social worker to assist with disposition.   Diet Order            DIET DYS 3 Room service appropriate? No; Fluid consistency: Thin  Diet effective now                    Consultants:  None  Procedures:  None    Medications:   .  stroke: mapping our early stages of recovery book   Does not apply Once  . apixaban  5 mg Oral BID  . atorvastatin  10 mg Oral Daily  . busPIRone  5 mg Oral BID  . cholecalciferol  1,000 Units Oral Daily  . feeding supplement  237 mL Oral TID BM  . haloperidol  0.5 mg Oral BID  . insulin aspart  0-9 Units Subcutaneous TID WC  . levothyroxine  88 mcg Oral Q0600  . memantine  10 mg Oral BID  . metoprolol succinate  25 mg Oral Daily  . multivitamin with minerals  1 tablet Oral Daily  . pantoprazole  40 mg Oral Daily   Continuous Infusions:  Anti-infectives (From admission, onward)   None             Family Communication/Anticipated D/C date and plan/Code Status   DVT prophylaxis:  apixaban (ELIQUIS) tablet 5 mg     Code Status: DNR  Family Communication: Plan discussed with her daughter, [name]. Disposition Plan:    Status is: Observation  The patient will require care spanning > 2 midnights and should be moved to inpatient because: Unsafe d/c plan  Dispo: The patient is from: Home              Anticipated d/c is to: SNF              Patient currently is not medically stable  to d/c.   Difficult to place patient No           Subjective:   Interval events noted.  She has been restless, anxious and disoriented at times.  She has made frequent attempts to get out of bed even though it is unsafe  Objective:    Vitals:   02/09/21 1938 02/09/21 2348 02/10/21 0423 02/10/21 0814  BP: 129/60 (!) 157/79 (!) 111/59 (!) 132/94  Pulse: 77 (!) 101 71 69  Resp: 18 20 17 17   Temp: 98.3 F (36.8 C) (!) 97.4 F (36.3 C) (!) 97.5 F (36.4 C) 98.1 F (36.7 C)  TempSrc:  Oral Oral   SpO2: 99% 98% 96% 100%  Weight:      Height:       No data found.   Intake/Output Summary (Last 24 hours) at 02/10/2021 1047 Last data filed at 02/10/2021 4132 Gross per 24 hour  Intake 510 ml  Output --  Net 510 ml   Filed Weights   02/07/21 1406 02/07/21 2255  Weight: 90 kg 90.7 kg    Exam:  GEN: NAD SKIN: Warm and dry EYES: No pallor or icterus ENT: MMM CV: RRR PULM: CTA B ABD: soft, ND, NT, +BS CNS: AAO x 2 (person and place), non focal EXT: No edema or tenderness        Data Reviewed:   I have personally reviewed following labs and imaging studies:  Labs: Labs show the following:   Basic Metabolic Panel: Recent Labs  Lab 02/05/21 1346 02/07/21 1412 02/08/21 0504 02/09/21 0538  NA 137 130* 138 140  K 5.0 4.1 3.8 3.8  CL 102 100 107 111  CO2 25 20* 22 23  GLUCOSE 197* 227* 119* 149*  BUN 25 29* 28* 29*  CREATININE 1.25* 1.32* 1.17* 1.10*  CALCIUM 9.7 8.7* 8.5* 8.6*  MG  --   --  1.6* 1.9  PHOS  --   --  3.1  --    GFR Estimated Creatinine Clearance: 39.3 mL/min (A) (by C-G formula based on SCr of 1.1 mg/dL (H)). Liver Function Tests: Recent Labs  Lab 02/05/21 1346 02/07/21 1412  AST 28 51*  ALT 24 29  ALKPHOS  --  94  BILITOT 1.4* 2.4*  PROT 6.9 7.2  ALBUMIN  --  3.7   No results for input(s): LIPASE, AMYLASE in the last 168 hours. No results for input(s): AMMONIA in the last 168 hours. Coagulation profile No results for  input(s): INR, PROTIME in the last 168 hours.  CBC: Recent Labs  Lab 02/05/21 1346 02/07/21 1412 02/08/21 0504 02/09/21 0538  WBC 10.2 12.3* 11.1* 7.4  NEUTROABS  --   --   --  4.0  HGB 15.6* 15.4* 14.1 13.1  HCT  47.3* 43.6 41.9 38.2  MCV 95.2 92.8 93.3 95.0  PLT 275 278 250 231   Cardiac Enzymes: No results for input(s): CKTOTAL, CKMB, CKMBINDEX, TROPONINI in the last 168 hours. BNP (last 3 results) No results for input(s): PROBNP in the last 8760 hours. CBG: Recent Labs  Lab 02/08/21 2319 02/09/21 0749 02/09/21 1702 02/09/21 2112 02/10/21 0816  GLUCAP 188* 148* 214* 193* 170*   D-Dimer: No results for input(s): DDIMER in the last 72 hours. Hgb A1c: No results for input(s): HGBA1C in the last 72 hours. Lipid Profile: Recent Labs    02/08/21 0504  CHOL 126  HDL 35*  LDLCALC 70  TRIG 103  CHOLHDL 3.6   Thyroid function studies: Recent Labs    02/08/21 0504  TSH 1.612   Anemia work up: No results for input(s): VITAMINB12, FOLATE, FERRITIN, TIBC, IRON, RETICCTPCT in the last 72 hours. Sepsis Labs: Recent Labs  Lab 02/05/21 1346 02/07/21 1412 02/08/21 0504 02/09/21 0538  WBC 10.2 12.3* 11.1* 7.4    Microbiology Recent Results (from the past 240 hour(s))  Urine Culture     Status: None   Collection Time: 02/07/21  5:27 PM   Specimen: Urine, Random  Result Value Ref Range Status   Specimen Description   Final    URINE, RANDOM Performed at [facility], [address] [place], [place] [address]    Special Requests   Final    NONE Performed at [facility], [address] [place], [place] [address]    Culture   Final    NO GROWTH Performed at [facility], [place] [address] [place], [place] [address]    Report Status 02/09/2021 FINAL  Final  SARS CORONAVIRUS 2 (TAT 6-24 HRS) Nasopharyngeal Nasopharyngeal Swab     Status: Abnormal   Collection Time: 02/07/21  7:16 PM   Specimen: Nasopharyngeal Swab  Result Value Ref  Range Status   SARS Coronavirus 2 POSITIVE (A) NEGATIVE Final    Comment: (NOTE) SARS-CoV-2 target nucleic acids are DETECTED.  The SARS-CoV-2 RNA is generally detectable in upper and lower respiratory specimens during the acute phase of infection. Positive results are indicative of the presence of SARS-CoV-2 RNA. Clinical correlation with patient history and other diagnostic information is  necessary to determine patient infection status. Positive results do not rule out bacterial infection or co-infection with other viruses.  The expected result is Negative.  Fact Sheet for Patients: SugarRoll.be  Fact Sheet for Healthcare Providers: https://www.woods-mathews.com/  This test is not yet approved or cleared by the [place] FDA and  has been authorized for detection and/or diagnosis of SARS-CoV-2 by FDA under an Emergency Use Authorization (EUA). This EUA will remain  in effect (meaning this test can be used) for the duration of the COVID-19 declaration under Section 564(b)(1) of the Act, 21 U. S.C. section 360bbb-3(b)(1), unless the authorization is terminated or revoked sooner.   Performed at [facility], [place] [address], [place], [place] [address]     Procedures and diagnostic studies:  MR BRAIN WO CONTRAST  Result Date: 02/08/2021 CLINICAL DATA:  Acute neuro deficit. Left-sided weakness and atrial fibrillation. EXAM: MRI HEAD WITHOUT CONTRAST TECHNIQUE: Multiplanar, multiecho pulse sequences of the brain and surrounding structures were obtained without intravenous contrast. COMPARISON:  CT head 02/07/2021 FINDINGS: Brain: Acute infarct in the right insular cortex and subinsular white matter. These areas show hyperintensity on FLAIR. Additional small areas of acute infarct in the right frontal and parietal  cortex and right parietal white matter. Generalized atrophy. Mild white matter changes elsewhere. Small chronic infarct  right thalamus. No hemorrhage or mass. Negative for hydrocephalus. Vascular: Normal arterial flow voids. Skull and upper cervical spine: Negative Sinuses/Orbits: Mild mucosal edema paranasal sinuses. Negative orbit Other: None IMPRESSION: Acute right MCA infarct involving the insula and lateral basal ganglia. Small areas of acute infarct in the right frontal and parietal lobe. Generalized atrophy. Electronically Signed   By: Franchot Gallo M.D.   On: 02/08/2021 14:13   US Carotid Bilateral (at [facility] and [facility] only)  Result Date: 02/09/2021 CLINICAL DATA:  CVA. History of hypertension, hyperlipidemia and diabetes. EXAM: BILATERAL CAROTID DUPLEX ULTRASOUND TECHNIQUE: Pearline Cables scale imaging, color Doppler and duplex ultrasound were performed of bilateral carotid and vertebral arteries in the neck. COMPARISON:  None. FINDINGS: Criteria: Quantification of carotid stenosis is based on velocity parameters that correlate the residual internal carotid diameter with NASCET-based stenosis levels, using the diameter of the distal internal carotid lumen as the denominator for stenosis measurement. The following velocity measurements were obtained: RIGHT ICA: 61/21 cm/sec CCA: 22/33 cm/sec SYSTOLIC ICA/CCA RATIO:  1.0 ECA: 74 cm/sec LEFT ICA: 75/20 cm/sec CCA: 61/22 cm/sec SYSTOLIC ICA/CCA RATIO:  1.1 ECA: 77 cm/sec RIGHT CAROTID ARTERY: There is a minimal amount of circumferential intimal thickening involving the right common carotid artery (images 2, 3 and 4). There is a minimal amount of eccentric echogenic plaque within the right carotid bulb (image 6 and 17), extending to involve the origin and proximal aspects the right internal carotid artery (image 22), not resulting in elevated peak systolic velocities within the interrogated course of the right internal carotid artery to suggest a hemodynamically significant stenosis RIGHT VERTEBRAL ARTERY:  Antegrade flow LEFT CAROTID ARTERY: There is a minimal amount of intimal thickening  involving the distal aspect the left common carotid artery (image 35). There is a minimal amount of eccentric echogenic plaque within the left carotid bulb (images 36 and 48), extending to involve the origin and proximal aspects of the left internal carotid artery (image 53), not resulting in elevated peak systolic velocities within the interrogated course of the left internal carotid artery to suggest a hemodynamically significant stenosis. LEFT VERTEBRAL ARTERY:  Antegrade flow IMPRESSION: Minimal amount of bilateral atherosclerotic plaque, right subjectively greater than left, not resulting in a hemodynamically significant stenosis within either internal carotid artery. Electronically Signed   By: Sandi Mariscal M.D.   On: 02/09/2021 08:51               LOS: 1 day   Shameika Speelman  [facility]   Pager on [email or web address]. If 7PM-7AM, please contact night-coverage at [email or web address]     02/10/2021, 10:47 AM

## 2021-02-10 NOTE — TOC Progression Note (Signed)
Transition of Care [facility]) - Progression Note    Patient Details  Name: Michelle Vang MRN: [id number] Date of Birth: 1928-12-06  [facility]) CM/SW Contact  Izola Price, RN Phone Number: 02/10/2021, 9:58 AM  Clinical Narrative:   02/10/21 Spoke with Daughter, [name], regarding SNF acceptances so far to see if she has a preference yet between the three that had accepted. She wishes to do more research today but would call this RN CM back today or tomorrow's RN CM who spoke with her on Friday. Will start insurance authorization once a choice is made. RN CM gave her contact numbers for today and who to ask for on Monday 5/30. Simmie Davies RN CM [phone] 1000 am.          Expected Discharge Plan and Services                                                 Social Determinants of Health (SDOH) Interventions    Readmission Risk Interventions No flowsheet data found.

## 2021-02-11 DIAGNOSIS — I639 Cerebral infarction, unspecified: Secondary | ICD-10-CM | POA: Diagnosis not present

## 2021-02-11 LAB — GLUCOSE, CAPILLARY
Glucose-Capillary: 129 mg/dL — ABNORMAL HIGH (ref 70–99)
Glucose-Capillary: 230 mg/dL — ABNORMAL HIGH (ref 70–99)
Glucose-Capillary: 231 mg/dL — ABNORMAL HIGH (ref 70–99)
Glucose-Capillary: 256 mg/dL — ABNORMAL HIGH (ref 70–99)

## 2021-02-11 MED ORDER — INSULIN GLARGINE 100 UNIT/ML ~~LOC~~ SOLN
7.0000 [IU] | Freq: Every day | SUBCUTANEOUS | Status: DC
  Administered 2021-02-11 – 2021-02-14 (×4): 7 [IU] via SUBCUTANEOUS
  Filled 2021-02-11 (×7): qty 0.07

## 2021-02-11 NOTE — Progress Notes (Addendum)
Progress Note    Michelle Vang  KVQ:[id number] DOB: 11-Sep-1929  DOA: 02/07/2021 PCP: Jearld Fenton, NP      Brief Narrative:    Medical records reviewed and are as summarized below:  Michelle Vang is a 87 y.o. female with past medical history of atrial fibrillation was newly diagnosed, hyperlipidemia, type 2 diabetes, hypothyroidism, history of lung cancer, bladder cancer and GERD had initially presented to cardiology clinic with increasing generalized weakness, fatigue and somnolence of about 1 week duration.  Reportedly, patient had multiple falls, about 6-8 times.  Her daughter had found her on the floor and noticed left-sided weakness and slurred speech.  She was supposed to start Eliquis and metoprolol on the day prior to admission.  She was admitted to the hospital for acute confusional state, generalized weakness, somnolence and dehydration.  Incidentally, she was found to have COVID-19 infection.      Assessment/Plan:   Principal Problem:   Acute CVA (cerebrovascular accident) [facility]) Active Problems:   Essential hypertension   GERD   Type 2 diabetes mellitus without complication, without long-term current use of insulin (HCC)   Mild cognitive impairment with memory loss   HLD (hyperlipidemia)   Atrial fibrillation (HCC)   Lethargy   Weakness   Falls   Nutrition Problem: Increased nutrient needs Etiology: acute illness (COVID19)  Signs/Symptoms: estimated needs   Body mass index is 31.32 kg/m.  (Obesity)   Acute right MCA stroke, acute confusion, somnolence: MRI brain showed acute R MCA infarct involving the insula and lateral basal ganglia.  Small areas of acute infarct in the right frontal and parietal lobe.  Continue PT and OT.  Continue Eliquis.  Dehydration: Improved.    Persistent atrial fibrillation, recent diagnosis: CHA2DS2-VASc is 7.  Continue metoprolol and Eliquis.  Generalized weakness, recurrent falls: PT and OT recommend discharge to  SNF.  Hypomagnesemia: Improved  Mild dementia, cognitive dysfunction, confusion: Continue one-to-one sitter for safety.  Continue buspirone and Namenda.  COVID-19 infection: No respiratory or GI symptoms.  She is tolerating room air.  No indication for treatment at this time.  She is fully vaccinated including booster dose.  Other comorbidities include probable CKD stage IIIa, GERD, hypothyroidism, type II DM, history of lung and bladder cancer in remission  Awaiting placement to SNF.  Follow-up with social worker to assist with disposition.   Diet Order            DIET DYS 3 Room service appropriate? No; Fluid consistency: Thin  Diet effective now                    Consultants:  None  Procedures:  None    Medications:   .  stroke: mapping our early stages of recovery book   Does not apply Once  . apixaban  5 mg Oral BID  . atorvastatin  10 mg Oral Daily  . busPIRone  5 mg Oral BID  . cholecalciferol  1,000 Units Oral Daily  . feeding supplement  237 mL Oral TID BM  . insulin aspart  0-9 Units Subcutaneous TID WC  . insulin glargine  7 Units Subcutaneous Daily  . levothyroxine  88 mcg Oral Q0600  . memantine  10 mg Oral BID  . metoprolol succinate  25 mg Oral Daily  . multivitamin with minerals  1 tablet Oral Daily  . pantoprazole  40 mg Oral Daily   Continuous Infusions:    Anti-infectives (From admission, onward)  None             Family Communication/Anticipated D/C date and plan/Code Status   DVT prophylaxis:  apixaban (ELIQUIS) tablet 5 mg     Code Status: DNR  Family Communication: None Disposition Plan:    Status is: Observation  The patient will require care spanning > 2 midnights and should be moved to inpatient because: Unsafe d/c plan  Dispo: The patient is from: Home              Anticipated d/c is to: SNF              Patient currently is not medically stable to d/c.   Difficult to place patient  No           Subjective:   Interval events noted.  She had been trying to get out of bed despite multiple instructions to stay in bed.  Sitter at the bedside.  She has no complaints.  Objective:    Vitals:   02/11/21 0021 02/11/21 0416 02/11/21 0732 02/11/21 0742  BP: (!) 147/77 (!) 143/96 (!) 89/59 136/88  Pulse: 74 70 64 75  Resp: 16 16 20    Temp: 98 F (36.7 C) 98.2 F (36.8 C) 98.1 F (36.7 C)   TempSrc: Oral Oral    SpO2: 98% 96% 98%   Weight:      Height:       No data found.   Intake/Output Summary (Last 24 hours) at 02/11/2021 1109 Last data filed at 02/10/2021 2300 Gross per 24 hour  Intake 270 ml  Output --  Net 270 ml   Filed Weights   02/07/21 1406 02/07/21 2255  Weight: 90 kg 90.7 kg    Exam:  GEN: NAD SKIN: No rash EYES: EOMI ENT: MMM CV: RRR PULM: CTA B ABD: soft, ND, NT, +BS CNS: AAO x 2 (person and place), non focal EXT: No edema or tenderness        Data Reviewed:   I have personally reviewed following labs and imaging studies:  Labs: Labs show the following:   Basic Metabolic Panel: Recent Labs  Lab 02/05/21 1346 02/07/21 1412 02/08/21 0504 02/09/21 0538  NA 137 130* 138 140  K 5.0 4.1 3.8 3.8  CL 102 100 107 111  CO2 25 20* 22 23  GLUCOSE 197* 227* 119* 149*  BUN 25 29* 28* 29*  CREATININE 1.25* 1.32* 1.17* 1.10*  CALCIUM 9.7 8.7* 8.5* 8.6*  MG  --   --  1.6* 1.9  PHOS  --   --  3.1  --    GFR Estimated Creatinine Clearance: 39.3 mL/min (A) (by C-G formula based on SCr of 1.1 mg/dL (H)). Liver Function Tests: Recent Labs  Lab 02/05/21 1346 02/07/21 1412  AST 28 51*  ALT 24 29  ALKPHOS  --  94  BILITOT 1.4* 2.4*  PROT 6.9 7.2  ALBUMIN  --  3.7   No results for input(s): LIPASE, AMYLASE in the last 168 hours. No results for input(s): AMMONIA in the last 168 hours. Coagulation profile No results for input(s): INR, PROTIME in the last 168 hours.  CBC: Recent Labs  Lab 02/05/21 1346  02/07/21 1412 02/08/21 0504 02/09/21 0538  WBC 10.2 12.3* 11.1* 7.4  NEUTROABS  --   --   --  4.0  HGB 15.6* 15.4* 14.1 13.1  HCT 47.3* 43.6 41.9 38.2  MCV 95.2 92.8 93.3 95.0  PLT 275 278 250 231   Cardiac Enzymes:  No results for input(s): CKTOTAL, CKMB, CKMBINDEX, TROPONINI in the last 168 hours. BNP (last 3 results) No results for input(s): PROBNP in the last 8760 hours. CBG: Recent Labs  Lab 02/09/21 2112 02/10/21 0816 02/10/21 1212 02/10/21 1613 02/11/21 0734  GLUCAP 193* 170* 177* 334* 129*   D-Dimer: No results for input(s): DDIMER in the last 72 hours. Hgb A1c: No results for input(s): HGBA1C in the last 72 hours. Lipid Profile: No results for input(s): CHOL, HDL, LDLCALC, TRIG, CHOLHDL, LDLDIRECT in the last 72 hours. Thyroid function studies: No results for input(s): TSH, T4TOTAL, T3FREE, THYROIDAB in the last 72 hours.  Invalid input(s): FREET3 Anemia work up: No results for input(s): VITAMINB12, FOLATE, FERRITIN, TIBC, IRON, RETICCTPCT in the last 72 hours. Sepsis Labs: Recent Labs  Lab 02/05/21 1346 02/07/21 1412 02/08/21 0504 02/09/21 0538  WBC 10.2 12.3* 11.1* 7.4    Microbiology Recent Results (from the past 240 hour(s))  Urine Culture     Status: None   Collection Time: 02/07/21  5:27 PM   Specimen: Urine, Random  Result Value Ref Range Status   Specimen Description   Final    URINE, RANDOM Performed at [facility], [address], [place], [place] [address]    Special Requests   Final    NONE Performed at [facility], [address] [place], [place] [address]    Culture   Final    NO GROWTH Performed at [facility], [place] [address], [place], [place] [address]    Report Status 02/09/2021 FINAL  Final  SARS CORONAVIRUS 2 (TAT 6-24 HRS) Nasopharyngeal Nasopharyngeal Swab     Status: Abnormal   Collection Time: 02/07/21  7:16 PM   Specimen: Nasopharyngeal Swab  Result Value Ref Range Status   SARS  Coronavirus 2 POSITIVE (A) NEGATIVE Final    Comment: (NOTE) SARS-CoV-2 target nucleic acids are DETECTED.  The SARS-CoV-2 RNA is generally detectable in upper and lower respiratory specimens during the acute phase of infection. Positive results are indicative of the presence of SARS-CoV-2 RNA. Clinical correlation with patient history and other diagnostic information is  necessary to determine patient infection status. Positive results do not rule out bacterial infection or co-infection with other viruses.  The expected result is Negative.  Fact Sheet for Patients: SugarRoll.be  Fact Sheet for Healthcare Providers: https://www.woods-mathews.com/  This test is not yet approved or cleared by the [place] FDA and  has been authorized for detection and/or diagnosis of SARS-CoV-2 by FDA under an Emergency Use Authorization (EUA). This EUA will remain  in effect (meaning this test can be used) for the duration of the COVID-19 declaration under Section 564(b)(1) of the Act, 21 U. S.C. section 360bbb-3(b)(1), unless the authorization is terminated or revoked sooner.   Performed at [facility], [place] [address] [place], [place] [address]     Procedures and diagnostic studies:  No results found.             LOS: 2 days   Dakhari Zuver  [facility]   Pager on [email or web address]. If 7PM-7AM, please contact night-coverage at [email or web address]     02/11/2021, 11:09 AM

## 2021-02-12 DIAGNOSIS — I639 Cerebral infarction, unspecified: Secondary | ICD-10-CM | POA: Diagnosis not present

## 2021-02-12 LAB — CBC
HCT: 39.2 % (ref 36.0–46.0)
Hemoglobin: 13.6 g/dL (ref 12.0–15.0)
MCH: 32.3 pg (ref 26.0–34.0)
MCHC: 34.7 g/dL (ref 30.0–36.0)
MCV: 93.1 fL (ref 80.0–100.0)
Platelets: 261 10*3/uL (ref 150–400)
RBC: 4.21 MIL/uL (ref 3.87–5.11)
RDW: 12.2 % (ref 11.5–15.5)
WBC: 9.1 10*3/uL (ref 4.0–10.5)
nRBC: 0 % (ref 0.0–0.2)

## 2021-02-12 LAB — RESP PANEL BY RT-PCR (FLU A&B, COVID) ARPGX2
Influenza A by PCR: NEGATIVE
Influenza B by PCR: NEGATIVE
SARS Coronavirus 2 by RT PCR: NEGATIVE

## 2021-02-12 LAB — GLUCOSE, CAPILLARY
Glucose-Capillary: 168 mg/dL — ABNORMAL HIGH (ref 70–99)
Glucose-Capillary: 295 mg/dL — ABNORMAL HIGH (ref 70–99)
Glucose-Capillary: 222 mg/dL — ABNORMAL HIGH (ref 70–99)
Glucose-Capillary: 249 mg/dL — ABNORMAL HIGH (ref 70–99)

## 2021-02-12 NOTE — Plan of Care (Signed)

## 2021-02-12 NOTE — TOC Progression Note (Signed)
Transition of Care [facility]) - Progression Note    Patient Details  Name: ZALIYAH MEIKLE MRN: [id number] Date of Birth: 26-Nov-1928  Transition of Care [facility]) CM/SW [place], RN Phone Number: 02/12/2021, 1:57 PM  Clinical Narrative:   TOC spoke to patient's daughter, [name].  [name] states that she continues to do research on facilities.  TOC stated that we will require a response soon to make arrangements.  [name] stated she would call tomorrow at the latest.  Patient continues to be in [place] quarantine.  TOC to follow.         Expected Discharge Plan and Services                                                 Social Determinants of Health (SDOH) Interventions    Readmission Risk Interventions No flowsheet data found.

## 2021-02-12 NOTE — Progress Notes (Signed)
Progress Note    Michelle Vang  YPP:[id number] DOB: 02-15-1929  DOA: 02/07/2021 PCP: Jearld Fenton, NP      Brief Narrative:    Medical records reviewed and are as summarized below:  Michelle Vang is a 87 y.o. female with past medical history of atrial fibrillation was newly diagnosed, hyperlipidemia, type 2 diabetes, hypothyroidism, history of lung cancer, bladder cancer and GERD had initially presented to cardiology clinic with increasing generalized weakness, fatigue and somnolence of about 1 week duration.  Reportedly, patient had multiple falls, about 6-8 times.  Her daughter had found her on the floor and noticed left-sided weakness and slurred speech.  She was supposed to start Eliquis and metoprolol on the day prior to admission.  She was admitted to the hospital for acute confusional state, generalized weakness, somnolence and dehydration.  Incidentally, she was found to have COVID-19 infection.      Assessment/Plan:   Principal Problem:   Acute CVA (cerebrovascular accident) [facility]) Active Problems:   Essential hypertension   GERD   Type 2 diabetes mellitus without complication, without long-term current use of insulin (HCC)   Mild cognitive impairment with memory loss   HLD (hyperlipidemia)   Atrial fibrillation (HCC)   Lethargy   Weakness   Falls   Nutrition Problem: Increased nutrient needs Etiology: acute illness (COVID19)  Signs/Symptoms: estimated needs   Body mass index is 31.32 kg/m.  (Obesity)   Acute right MCA stroke, acute confusion, somnolence: MRI brain showed acute R MCA infarct involving the insula and lateral basal ganglia.  Small areas of acute infarct in the right frontal and parietal lobe.  Continue PT and OT.  Continue Eliquis.  Dehydration: Improved.    Persistent atrial fibrillation, recent diagnosis: CHA2DS2-VASc is 7.  Continue metoprolol and Eliquis.  Hypomagnesemia: Improved  Mild dementia, cognitive dysfunction,  confusion: Continue one-to-one sitter for safety.  Continue buspirone and Namenda.  COVID-19 infection: No respiratory or GI symptoms.  She is tolerating room air.  No indication for treatment at this time.  She is fully vaccinated including booster dose.  Generalized weakness, recurrent falls: PT and OT recommended discharge to SNF.  However, today recommendation was changed to SNF versus possible discharge to home with home health therapy if daughter is able to provide 24-hour supervision.   Other comorbidities include probable CKD stage IIIa, GERD, hypothyroidism, type II DM, history of lung and bladder cancer in remission    Diet Order            DIET DYS 3 Room service appropriate? No; Fluid consistency: Thin  Diet effective now                    Consultants:  None  Procedures:  None    Medications:   .  stroke: mapping our early stages of recovery book   Does not apply Once  . apixaban  5 mg Oral BID  . atorvastatin  10 mg Oral Daily  . busPIRone  5 mg Oral BID  . cholecalciferol  1,000 Units Oral Daily  . feeding supplement  237 mL Oral TID BM  . insulin aspart  0-9 Units Subcutaneous TID WC  . insulin glargine  7 Units Subcutaneous Daily  . levothyroxine  88 mcg Oral Q0600  . memantine  10 mg Oral BID  . metoprolol succinate  25 mg Oral Daily  . multivitamin with minerals  1 tablet Oral Daily  . pantoprazole  40 mg  Oral Daily   Continuous Infusions:    Anti-infectives (From admission, onward)   None             Family Communication/Anticipated D/C date and plan/Code Status   DVT prophylaxis:  apixaban (ELIQUIS) tablet 5 mg     Code Status: DNR  Family Communication: None Disposition Plan:    Status is: Inpatient  Remains inpatient appropriate because:Inpatient level of care appropriate due to severity of illness   Dispo: The patient is from: Home              Anticipated d/c is to: SNF              Patient currently is not  medically stable to d/c.   Difficult to place patient No                 Subjective:   Interval events noted.  She has no complaints.  Sitter at the bedside.  Objective:    Vitals:   02/11/21 2348 02/12/21 0403 02/12/21 0824 02/12/21 1133  BP: (!) 142/95 129/71 115/70 (!) 107/57  Pulse: 82 68 72 70  Resp: 16 17 16 16   Temp: 98.5 F (36.9 C) 98.2 F (36.8 C) 97.6 F (36.4 C) 97.7 F (36.5 C)  TempSrc: Oral Oral Oral Oral  SpO2: 97% 97% 98% 98%  Weight:      Height:       No data found.   Intake/Output Summary (Last 24 hours) at 02/12/2021 1820 Last data filed at 02/12/2021 1804 Gross per 24 hour  Intake 120 ml  Output --  Net 120 ml   Filed Weights   02/07/21 1406 02/07/21 2255  Weight: 90 kg 90.7 kg    Exam:  GEN: NAD SKIN: No rash EYES: EOMI ENT: MMM CV: RRR PULM: CTA B ABD: soft, obese, NT, +BS CNS: AAO x 2 (person and place), non focal EXT: No edema or tendernes         Data Reviewed:   I have personally reviewed following labs and imaging studies:  Labs: Labs show the following:   Basic Metabolic Panel: Recent Labs  Lab 02/07/21 1412 02/08/21 0504 02/09/21 0538  NA 130* 138 140  K 4.1 3.8 3.8  CL 100 107 111  CO2 20* 22 23  GLUCOSE 227* 119* 149*  BUN 29* 28* 29*  CREATININE 1.32* 1.17* 1.10*  CALCIUM 8.7* 8.5* 8.6*  MG  --  1.6* 1.9  PHOS  --  3.1  --    GFR Estimated Creatinine Clearance: 39.3 mL/min (A) (by C-G formula based on SCr of 1.1 mg/dL (H)). Liver Function Tests: Recent Labs  Lab 02/07/21 1412  AST 51*  ALT 29  ALKPHOS 94  BILITOT 2.4*  PROT 7.2  ALBUMIN 3.7   No results for input(s): LIPASE, AMYLASE in the last 168 hours. No results for input(s): AMMONIA in the last 168 hours. Coagulation profile No results for input(s): INR, PROTIME in the last 168 hours.  CBC: Recent Labs  Lab 02/07/21 1412 02/08/21 0504 02/09/21 0538 02/12/21 0444  WBC 12.3* 11.1* 7.4 9.1  NEUTROABS  --   --   4.0  --   HGB 15.4* 14.1 13.1 13.6  HCT 43.6 41.9 38.2 39.2  MCV 92.8 93.3 95.0 93.1  PLT 278 250 231 261   Cardiac Enzymes: No results for input(s): CKTOTAL, CKMB, CKMBINDEX, TROPONINI in the last 168 hours. BNP (last 3 results) No results for input(s): PROBNP in the last 8760  hours. CBG: Recent Labs  Lab 02/11/21 1632 02/11/21 2051 02/12/21 0746 02/12/21 1141 02/12/21 1649  GLUCAP 231* 256* 168* 295* 222*   D-Dimer: No results for input(s): DDIMER in the last 72 hours. Hgb A1c: No results for input(s): HGBA1C in the last 72 hours. Lipid Profile: No results for input(s): CHOL, HDL, LDLCALC, TRIG, CHOLHDL, LDLDIRECT in the last 72 hours. Thyroid function studies: No results for input(s): TSH, T4TOTAL, T3FREE, THYROIDAB in the last 72 hours.  Invalid input(s): FREET3 Anemia work up: No results for input(s): VITAMINB12, FOLATE, FERRITIN, TIBC, IRON, RETICCTPCT in the last 72 hours. Sepsis Labs: Recent Labs  Lab 02/07/21 1412 02/08/21 0504 02/09/21 0538 02/12/21 0444  WBC 12.3* 11.1* 7.4 9.1    Microbiology Recent Results (from the past 240 hour(s))  Urine Culture     Status: None   Collection Time: 02/07/21  5:27 PM   Specimen: Urine, Random  Result Value Ref Range Status   Specimen Description   Final    URINE, RANDOM Performed at [facility], [address], [place], [place] [address]    Special Requests   Final    NONE Performed at [facility], [address] [place], [place] [address]    Culture   Final    NO GROWTH Performed at [facility], [place] [address], [place], [place] [address]    Report Status 02/09/2021 FINAL  Final  SARS CORONAVIRUS 2 (TAT 6-24 HRS) Nasopharyngeal Nasopharyngeal Swab     Status: Abnormal   Collection Time: 02/07/21  7:16 PM   Specimen: Nasopharyngeal Swab  Result Value Ref Range Status   SARS Coronavirus 2 POSITIVE (A) NEGATIVE Final    Comment: (NOTE) SARS-CoV-2 target nucleic acids are  DETECTED.  The SARS-CoV-2 RNA is generally detectable in upper and lower respiratory specimens during the acute phase of infection. Positive results are indicative of the presence of SARS-CoV-2 RNA. Clinical correlation with patient history and other diagnostic information is  necessary to determine patient infection status. Positive results do not rule out bacterial infection or co-infection with other viruses.  The expected result is Negative.  Fact Sheet for Patients: SugarRoll.be  Fact Sheet for Healthcare Providers: https://www.woods-mathews.com/  This test is not yet approved or cleared by the [place] FDA and  has been authorized for detection and/or diagnosis of SARS-CoV-2 by FDA under an Emergency Use Authorization (EUA). This EUA will remain  in effect (meaning this test can be used) for the duration of the COVID-19 declaration under Section 564(b)(1) of the Act, 21 U. S.C. section 360bbb-3(b)(1), unless the authorization is terminated or revoked sooner.   Performed at [facility], [place] [address], [place], [place] [address]   Resp Panel by RT-PCR (Flu A&B, Covid) Nasopharyngeal Swab     Status: None   Collection Time: 02/12/21  2:13 PM   Specimen: Nasopharyngeal Swab; Nasopharyngeal(NP) swabs in vial transport medium  Result Value Ref Range Status   SARS Coronavirus 2 by RT PCR NEGATIVE NEGATIVE Final    Comment: (NOTE) SARS-CoV-2 target nucleic acids are NOT DETECTED.  The SARS-CoV-2 RNA is generally detectable in upper respiratory specimens during the acute phase of infection. The lowest concentration of SARS-CoV-2 viral copies this assay can detect is 138 copies/mL. A negative result does not preclude SARS-Cov-2 infection and should not be used as the sole basis for treatment or other patient management decisions. A negative result may occur with  improper specimen collection/handling, submission of specimen  other  than nasopharyngeal swab, presence of viral mutation(s) within the areas targeted by this assay, and inadequate number of viral copies(<138 copies/mL). A negative result must be combined with clinical observations, patient history, and epidemiological information. The expected result is Negative.  Fact Sheet for Patients:  EntrepreneurPulse.com.au  Fact Sheet for Healthcare Providers:  IncredibleEmployment.be  This test is no t yet approved or cleared by the [place] FDA and  has been authorized for detection and/or diagnosis of SARS-CoV-2 by FDA under an Emergency Use Authorization (EUA). This EUA will remain  in effect (meaning this test can be used) for the duration of the COVID-19 declaration under Section 564(b)(1) of the Act, 21 U.S.C.section 360bbb-3(b)(1), unless the authorization is terminated  or revoked sooner.       Influenza A by PCR NEGATIVE NEGATIVE Final   Influenza B by PCR NEGATIVE NEGATIVE Final    Comment: (NOTE) The Xpert Xpress SARS-CoV-2/FLU/RSV plus assay is intended as an aid in the diagnosis of influenza from Nasopharyngeal swab specimens and should not be used as a sole basis for treatment. Nasal washings and aspirates are unacceptable for Xpert Xpress SARS-CoV-2/FLU/RSV testing.  Fact Sheet for Patients: EntrepreneurPulse.com.au  Fact Sheet for Healthcare Providers: IncredibleEmployment.be  This test is not yet approved or cleared by the [place] FDA and has been authorized for detection and/or diagnosis of SARS-CoV-2 by FDA under an Emergency Use Authorization (EUA). This EUA will remain in effect (meaning this test can be used) for the duration of the COVID-19 declaration under Section 564(b)(1) of the Act, 21 U.S.C. section 360bbb-3(b)(1), unless the authorization is terminated or revoked.  Performed at [facility], [place]  [place], [place] [address]     Procedures and diagnostic studies:  No results found.             LOS: 3 days   Michelle Vang  [facility]   Pager on [email or web address]. If 7PM-7AM, please contact night-coverage at [email or web address]     02/12/2021, 6:20 PM

## 2021-02-12 NOTE — Progress Notes (Signed)
Physical Therapy Treatment Patient Details Name: Michelle Vang MRN: [id number] DOB: 05/13/1929 Today's Date: 02/12/2021    History of Present Illness Michelle Vang is a 87 y.o. female with past medical history of atrial fibrillation was newly diagnosed, hyperlipidemia, type 2 diabetes, hypothyroidism, history of lung cancer, bladder cancer and GERD had initially presented to cardiology clinic with increasing fatigue and somnolence over the week. In addition, several recent falls and weakness with slurred speech. Adm for acute CVA and incidentally found to have COVID.    PT Comments    Pt did well with PT session and was able to ambulate ~175 ft in the room with the walker.  We did trial ~25 ft with single UE HHA and though she did not have any overt safety issues with this she was clearly more stable and safe with the walker.  Pt had no subjective fatigue and showed no DOE or SOB with prolonged bout of ambulation.  She did display some mild confusion and though she followed instructions/cues well she was unable to give consistent recollection of PLOF.  Pt making good gains, will benefit from continued PT in the hospital as well as HHPT as she transitions home.  Spoke with daughter [name]) and she is working on near 24/7 assist in the home.   Follow Up Recommendations  Supervision/Assistance - 24 hour (per progress, will likely not need SNF but benefit from HHPT)     Equipment Recommendations  3in1 (PT) (has RW)    Recommendations for Other Services       Precautions / Restrictions Precautions Precautions: Fall Restrictions Weight Bearing Restrictions: No    Mobility  Bed Mobility Overal bed mobility: Modified Independent             General bed mobility comments: easily able to transition to sitting EOB, donned socks in sitting w/o hesitation or assist    Transfers Overall transfer level: Needs assistance Equipment used: Rolling walker (2 wheeled) Transfers: Sit to/from  Stand Sit to Stand: Min guard         General transfer comment: heavy reliance on UEs, did not rise on first attempt but gathered herself and managed to rise w/o phyiscal assist with some extra effort on the next one  Ambulation/Gait Ambulation/Gait assistance: Supervision;Min guard Gait Distance (Feet): 175 Feet Assistive device: Rolling walker (2 wheeled)       General Gait Details: Pt has good technique with ambulation within room, We did multiple loops in iso room and she did occasionally have issues staying inside walker during turns, however no LOBs or stagger stepping.  Overall she did very well, minimal fatigue (HR ~100 bpm post effort)   Stairs             Wheelchair Mobility    Modified Rankin (Stroke Patients Only)       Balance Overall balance assessment: Needs assistance Sitting-balance support: No upper extremity supported;Feet supported Sitting balance-Leahy Scale: Good     Standing balance support: Bilateral upper extremity supported Standing balance-Leahy Scale: Good Standing balance comment: reliant on UE support but confident with no overt unsteadiness                            Cognition Arousal/Alertness: Awake/alert Behavior During Therapy: WFL for tasks assessed/performed Overall Cognitive Status: Difficult to assess  General Comments: Pt able to follow commands, overall approriate. She was unable to state date, even with some cuing, demos somedecreased safety awareness.      Exercises Other Exercises Other Exercises: performed some EOB balance acts with faded UE support.  heel raises, EO/EC static standing with wide and narrow BOS.  WBOS perturbations    General Comments        Pertinent Vitals/Pain Pain Assessment: No/denies pain    Home Living                      Prior Function            PT Goals (current goals can now be found in the care plan section)  Progress towards PT goals: Progressing toward goals    Frequency    7X/week      PT Plan Discharge plan needs to be updated;Frequency needs to be updated    Co-evaluation              AM-PAC PT "6 Clicks" Mobility   Outcome Measure  Help needed turning from your back to your side while in a flat bed without using bedrails?: None Help needed moving from lying on your back to sitting on the side of a flat bed without using bedrails?: None Help needed moving to and from a bed to a chair (including a wheelchair)?: A Little Help needed standing up from a chair using your arms (e.g., wheelchair or bedside chair)?: A Little Help needed to walk in hospital room?: A Little Help needed climbing 3-5 steps with a railing? : A Little 6 Click Score: 20    End of Session Equipment Utilized During Treatment: Gait belt Activity Tolerance: Patient tolerated treatment well Patient left: in bed;with bed alarm set;with family/visitor present   PT Visit Diagnosis: Unsteadiness on feet (R26.81);Other abnormalities of gait and mobility (R26.89);Repeated falls (R29.6);Muscle weakness (generalized) (M62.81);History of falling (Z91.81);Difficulty in walking, not elsewhere classified (R26.2)     Time: 6438-3818 PT Time Calculation (min) (ACUTE ONLY): 28 min  Charges:  $Gait Training: 8-22 mins $Therapeutic Activity: 8-22 mins                     Kreg Shropshire, DPT 02/12/2021, 4:38 PM

## 2021-02-12 NOTE — Care Management Important Message (Signed)
Important Message  Patient Details  Name: ATAYA MURDY MRN: [id number] Date of Birth: 06-08-1929   Medicare Important Message Given:  Yes  Patient is in an isolation room so I called her room but no answer [phone]).  RN CM has been discussing discharge plans with her daughter, Allayne Stack so I called her [phone]) and reviewed the Important Message from Medicare with her. She stated she is very pleased with staff working on her mother's case and felt very informed about her care.  I asked if she would like a copy of the form and she replied yes.  I sent via Secure e-mail to:  [email or web address]  I thanked her for time.  Juliann Pulse A Ervey Fallin 02/12/2021, 3:01 PM

## 2021-02-13 DIAGNOSIS — I4819 Other persistent atrial fibrillation: Secondary | ICD-10-CM | POA: Diagnosis not present

## 2021-02-13 DIAGNOSIS — R531 Weakness: Secondary | ICD-10-CM | POA: Diagnosis not present

## 2021-02-13 DIAGNOSIS — I10 Essential (primary) hypertension: Secondary | ICD-10-CM | POA: Diagnosis not present

## 2021-02-13 DIAGNOSIS — I639 Cerebral infarction, unspecified: Secondary | ICD-10-CM | POA: Diagnosis not present

## 2021-02-13 LAB — RESP PANEL BY RT-PCR (FLU A&B, COVID) ARPGX2
Influenza A by PCR: NEGATIVE
Influenza B by PCR: NEGATIVE
SARS Coronavirus 2 by RT PCR: POSITIVE — AB

## 2021-02-13 LAB — GLUCOSE, CAPILLARY
Glucose-Capillary: 171 mg/dL — ABNORMAL HIGH (ref 70–99)
Glucose-Capillary: 174 mg/dL — ABNORMAL HIGH (ref 70–99)
Glucose-Capillary: 348 mg/dL — ABNORMAL HIGH (ref 70–99)
Glucose-Capillary: 238 mg/dL — ABNORMAL HIGH (ref 70–99)

## 2021-02-13 NOTE — Plan of Care (Signed)

## 2021-02-13 NOTE — Progress Notes (Signed)
PT Cancellation Note  Patient Details Name: Michelle Vang MRN: [id number] DOB: 1929-07-16   Cancelled Treatment:     Pt was asleep in side lying upon arriving. She had untouched lunch tray at bedside. Easily awakes however requested not to have PT at this time. Pt did sit up EOB to eat lunch. Noticed several pills at bedside. RN notified. Acute PT will continue to follow and progress as able per POC.    Willette Pa 02/13/2021, 2:40 PM

## 2021-02-13 NOTE — Progress Notes (Signed)
Progress Note    Michelle Vang  TFT:[id number] DOB: [birth year]  DOA: 02/07/2021 PCP: Jearld Fenton, NP      Brief Narrative:    Medical records reviewed and are as summarized below:  Michelle Vang is a 87 y.o. female with past medical history of atrial fibrillation was newly diagnosed, hyperlipidemia, type 2 diabetes, hypothyroidism, history of lung cancer, bladder cancer and GERD had initially presented to cardiology clinic with increasing generalized weakness, fatigue and somnolence of about 1 week duration.  Reportedly, patient had multiple falls, about 6-8 times.  Her daughter had found her on the floor and noticed left-sided weakness and slurred speech.  She was supposed to start Eliquis and metoprolol on the day prior to admission.  She was admitted to the hospital on 5/26 for acute confusional state, generalized weakness, somnolence and dehydration.  Incidentally, she was found to have COVID-19 infection.  With hydration, some of patient's symptoms improved.  Seen by PT and OT who are recommending 24-hour supervision.  Attempting to get home health set up although insurance may not cover this.    Assessment/Plan:   Principal Problem:   Acute CVA (cerebrovascular accident) [facility]) Active Problems:   Essential hypertension   GERD   Type 2 diabetes mellitus without complication, without long-term current use of insulin (HCC)   Mild cognitive impairment with memory loss   HLD (hyperlipidemia)   Atrial fibrillation (HCC)   Lethargy   Weakness   Falls   Nutrition Problem: Increased nutrient needs Etiology: acute illness (COVID19)  Signs/Symptoms: estimated needs   Body mass index is 31.32 kg/m.  (Obesity)   Acute right MCA stroke, acute confusion, somnolence: MRI brain showed acute R MCA infarct involving the insula and lateral basal ganglia.  Small areas of acute infarct in the right frontal and parietal lobe.  Continue PT and OT.  Continue  Eliquis.  Dehydration: Improved.    Persistent atrial fibrillation, recent diagnosis: CHA2DS2-VASc is 7.  Continue metoprolol and Eliquis.  Hypomagnesemia: Improved  Mild dementia, cognitive dysfunction, confusion: Continue one-to-one sitter for safety.  Continue buspirone and Namenda.  COVID-19 infection: No respiratory or GI symptoms.  She is tolerating room air.  No indication for treatment at this time.  She is fully vaccinated including booster dose.  Generalized weakness, recurrent falls: PT and OT recommended discharge to SNF.  However, today recommendation was changed to SNF versus possible discharge to home with home health therapy if daughter is able to provide 24-hour supervision.   Other comorbidities include probable CKD stage IIIa, GERD, hypothyroidism, type II DM, history of lung and bladder cancer in remission    Diet Order            DIET DYS 3 Room service appropriate? No; Fluid consistency: Thin  Diet effective now                    Consultants:  None  Procedures:  None    Medications:   .  stroke: mapping our early stages of recovery book   Does not apply Once  . apixaban  5 mg Oral BID  . atorvastatin  10 mg Oral Daily  . busPIRone  5 mg Oral BID  . cholecalciferol  1,000 Units Oral Daily  . feeding supplement  237 mL Oral TID BM  . insulin aspart  0-9 Units Subcutaneous TID WC  . insulin glargine  7 Units Subcutaneous Daily  . levothyroxine  88 mcg Oral Q0600  .  memantine  10 mg Oral BID  . metoprolol succinate  25 mg Oral Daily  . multivitamin with minerals  1 tablet Oral Daily  . pantoprazole  40 mg Oral Daily   Continuous Infusions:    Anti-infectives (From admission, onward)   None             Family Communication/Anticipated D/C date and plan/Code Status   DVT prophylaxis:  apixaban (ELIQUIS) tablet 5 mg     Code Status: DNR  Family Communication: None Disposition Plan:    Status is: Inpatient  Remains  inpatient appropriate because:Inpatient level of care appropriate due to severity of illness   Dispo: The patient is from: Home              Anticipated d/c is to: SNF              Patient currently is not medically stable to d/c.   Difficult to place patient No                 Subjective:   Interval events noted.  She has no complaints.  Sitter at the bedside.  Objective:    Vitals:   02/12/21 2359 02/13/21 0352 02/13/21 0917 02/13/21 1231  BP: (!) 142/61 117/63 137/74 114/72  Pulse: 86 81 65 74  Resp: 14 14 17 18   Temp: 98 F (36.7 C) 97.9 F (36.6 C) 98.2 F (36.8 C)   TempSrc: Oral  Oral   SpO2: 97% 97% 97% 96%  Weight:      Height:       No data found.   Intake/Output Summary (Last 24 hours) at 02/13/2021 1704 Last data filed at 02/12/2021 1804 Gross per 24 hour  Intake 120 ml  Output --  Net 120 ml   Filed Weights   02/07/21 1406 02/07/21 2255  Weight: 90 kg 90.7 kg    Exam:  GEN: NAD SKIN: No rash EYES: EOMI ENT: MMM CV: RRR PULM: CTA B ABD: soft, obese, NT, +BS CNS: AAO x 2 (person and place), non focal EXT: No edema or tendernes         Data Reviewed:   I have personally reviewed following labs and imaging studies:  Labs: Labs show the following:   Basic Metabolic Panel: Recent Labs  Lab 02/07/21 1412 02/08/21 0504 02/09/21 0538  NA 130* 138 140  K 4.1 3.8 3.8  CL 100 107 111  CO2 20* 22 23  GLUCOSE 227* 119* 149*  BUN 29* 28* 29*  CREATININE 1.32* 1.17* 1.10*  CALCIUM 8.7* 8.5* 8.6*  MG  --  1.6* 1.9  PHOS  --  3.1  --    GFR Estimated Creatinine Clearance: 39.3 mL/min (A) (by C-G formula based on SCr of 1.1 mg/dL (H)). Liver Function Tests: Recent Labs  Lab 02/07/21 1412  AST 51*  ALT 29  ALKPHOS 94  BILITOT 2.4*  PROT 7.2  ALBUMIN 3.7   No results for input(s): LIPASE, AMYLASE in the last 168 hours. No results for input(s): AMMONIA in the last 168 hours. Coagulation profile No results for  input(s): INR, PROTIME in the last 168 hours.  CBC: Recent Labs  Lab 02/07/21 1412 02/08/21 0504 02/09/21 0538 02/12/21 0444  WBC 12.3* 11.1* 7.4 9.1  NEUTROABS  --   --  4.0  --   HGB 15.4* 14.1 13.1 13.6  HCT 43.6 41.9 38.2 39.2  MCV 92.8 93.3 95.0 93.1  PLT 278 250 231 261   Cardiac  Enzymes: No results for input(s): CKTOTAL, CKMB, CKMBINDEX, TROPONINI in the last 168 hours. BNP (last 3 results) No results for input(s): PROBNP in the last 8760 hours. CBG: Recent Labs  Lab 02/12/21 1141 02/12/21 1649 02/12/21 2047 02/13/21 0759 02/13/21 1154  GLUCAP 295* 222* 249* 171* 174*   D-Dimer: No results for input(s): DDIMER in the last 72 hours. Hgb A1c: No results for input(s): HGBA1C in the last 72 hours. Lipid Profile: No results for input(s): CHOL, HDL, LDLCALC, TRIG, CHOLHDL, LDLDIRECT in the last 72 hours. Thyroid function studies: No results for input(s): TSH, T4TOTAL, T3FREE, THYROIDAB in the last 72 hours.  Invalid input(s): FREET3 Anemia work up: No results for input(s): VITAMINB12, FOLATE, FERRITIN, TIBC, IRON, RETICCTPCT in the last 72 hours. Sepsis Labs: Recent Labs  Lab 02/07/21 1412 02/08/21 0504 02/09/21 0538 02/12/21 0444  WBC 12.3* 11.1* 7.4 9.1    Microbiology Recent Results (from the past 240 hour(s))  Urine Culture     Status: None   Collection Time: 02/07/21  5:27 PM   Specimen: Urine, Random  Result Value Ref Range Status   Specimen Description   Final    URINE, RANDOM Performed at [facility], [address] [place], [place] [address]    Special Requests   Final    NONE Performed at [facility], [address] [place], [place] [address]    Culture   Final    NO GROWTH Performed at [facility], [place] [address] [place], [place] [address]    Report Status 02/09/2021 FINAL  Final  SARS CORONAVIRUS 2 (TAT 6-24 HRS) Nasopharyngeal Nasopharyngeal Swab     Status: Abnormal   Collection Time: 02/07/21  7:16  PM   Specimen: Nasopharyngeal Swab  Result Value Ref Range Status   SARS Coronavirus 2 POSITIVE (A) NEGATIVE Final    Comment: (NOTE) SARS-CoV-2 target nucleic acids are DETECTED.  The SARS-CoV-2 RNA is generally detectable in upper and lower respiratory specimens during the acute phase of infection. Positive results are indicative of the presence of SARS-CoV-2 RNA. Clinical correlation with patient history and other diagnostic information is  necessary to determine patient infection status. Positive results do not rule out bacterial infection or co-infection with other viruses.  The expected result is Negative.  Fact Sheet for Patients: SugarRoll.be  Fact Sheet for Healthcare Providers: https://www.woods-mathews.com/  This test is not yet approved or cleared by the [place] FDA and  has been authorized for detection and/or diagnosis of SARS-CoV-2 by FDA under an Emergency Use Authorization (EUA). This EUA will remain  in effect (meaning this test can be used) for the duration of the COVID-19 declaration under Section 564(b)(1) of the Act, 21 U. S.C. section 360bbb-3(b)(1), unless the authorization is terminated or revoked sooner.   Performed at [facility], [place] [address], [place], [place] [address]   Resp Panel by RT-PCR (Flu A&B, Covid) Nasopharyngeal Swab     Status: None   Collection Time: 02/12/21  2:13 PM   Specimen: Nasopharyngeal Swab; Nasopharyngeal(NP) swabs in vial transport medium  Result Value Ref Range Status   SARS Coronavirus 2 by RT PCR NEGATIVE NEGATIVE Final    Comment: (NOTE) SARS-CoV-2 target nucleic acids are NOT DETECTED.  The SARS-CoV-2 RNA is generally detectable in upper respiratory specimens during the acute phase of infection. The lowest concentration of SARS-CoV-2 viral copies this assay can detect is 138 copies/mL. A negative result does not preclude SARS-Cov-2 infection and should not be  used as the sole basis for treatment or other patient management decisions. A negative result may occur with  improper specimen collection/handling, submission of specimen other than nasopharyngeal swab, presence of viral mutation(s) within the areas targeted by this assay, and inadequate number of viral copies(<138 copies/mL). A negative result must be combined with clinical observations, patient history, and epidemiological information. The expected result is Negative.  Fact Sheet for Patients:  EntrepreneurPulse.com.au  Fact Sheet for Healthcare Providers:  IncredibleEmployment.be  This test is no t yet approved or cleared by the [place] FDA and  has been authorized for detection and/or diagnosis of SARS-CoV-2 by FDA under an Emergency Use Authorization (EUA). This EUA will remain  in effect (meaning this test can be used) for the duration of the COVID-19 declaration under Section 564(b)(1) of the Act, 21 U.S.C.section 360bbb-3(b)(1), unless the authorization is terminated  or revoked sooner.       Influenza A by PCR NEGATIVE NEGATIVE Final   Influenza B by PCR NEGATIVE NEGATIVE Final    Comment: (NOTE) The Xpert Xpress SARS-CoV-2/FLU/RSV plus assay is intended as an aid in the diagnosis of influenza from Nasopharyngeal swab specimens and should not be used as a sole basis for treatment. Nasal washings and aspirates are unacceptable for Xpert Xpress SARS-CoV-2/FLU/RSV testing.  Fact Sheet for Patients: EntrepreneurPulse.com.au  Fact Sheet for Healthcare Providers: IncredibleEmployment.be  This test is not yet approved or cleared by the [place] FDA and has been authorized for detection and/or diagnosis of SARS-CoV-2 by FDA under an Emergency Use Authorization (EUA). This EUA will remain in effect (meaning this test can be used) for the duration of the COVID-19 declaration under Section  564(b)(1) of the Act, 21 U.S.C. section 360bbb-3(b)(1), unless the authorization is terminated or revoked.  Performed at [facility], [address] [place], [place] [address]   Resp Panel by RT-PCR (Flu A&B, Covid) Nasopharyngeal Swab     Status: Abnormal   Collection Time: 02/13/21  8:00 AM   Specimen: Nasopharyngeal Swab; Nasopharyngeal(NP) swabs in vial transport medium  Result Value Ref Range Status   SARS Coronavirus 2 by RT PCR POSITIVE (A) NEGATIVE Final    Comment: Results Called to: [facility] DORTHY 0930 02/13/2021 DLB (NOTE) SARS-CoV-2 target nucleic acids are DETECTED.  The SARS-CoV-2 RNA is generally detectable in upper respiratory specimens during the acute phase of infection. Positive results are indicative of the presence of the identified virus, but do not rule out bacterial infection or co-infection with other pathogens not detected by the test. Clinical correlation with patient history and other diagnostic information is necessary to determine patient infection status. The expected result is Negative.  Fact Sheet for Patients: EntrepreneurPulse.com.au  Fact Sheet for Healthcare Providers: IncredibleEmployment.be  This test is not yet approved or cleared by the [place] FDA and  has been authorized for detection and/or diagnosis of SARS-CoV-2 by FDA under an Emergency Use Authorization (EUA).  This EUA will remain in effect (meaning this test can be used) for the duration of  the  COVID-19 declaration under Section 564(b)(1) of the Act, 21 U.S.C. section 360bbb-3(b)(1), unless the authorization is terminated or revoked sooner.     Influenza A by PCR NEGATIVE NEGATIVE Final   Influenza B by PCR NEGATIVE NEGATIVE Final    Comment: (NOTE) The Xpert Xpress SARS-CoV-2/FLU/RSV plus assay is intended as an aid in the diagnosis of influenza from Nasopharyngeal swab specimens and should not be used as a sole basis  for treatment. Nasal  washings and aspirates are unacceptable for Xpert Xpress SARS-CoV-2/FLU/RSV testing.  Fact Sheet for Patients: EntrepreneurPulse.com.au  Fact Sheet for Healthcare Providers: IncredibleEmployment.be  This test is not yet approved or cleared by the [place] FDA and has been authorized for detection and/or diagnosis of SARS-CoV-2 by FDA under an Emergency Use Authorization (EUA). This EUA will remain in effect (meaning this test can be used) for the duration of the COVID-19 declaration under Section 564(b)(1) of the Act, 21 U.S.C. section 360bbb-3(b)(1), unless the authorization is terminated or revoked.  Performed at [facility], [address] [place], [place] [address]     Procedures and diagnostic studies:  No results found.             LOS: 4 days   Krissi Willaims Wynelle Link  [facility]   Pager on [email or web address]. If 7PM-7AM, please contact night-coverage at [email or web address]     02/13/2021, 5:04 PM

## 2021-02-13 NOTE — Progress Notes (Signed)
Physical Therapy Treatment Patient Details Name: Michelle Vang MRN: [id number] DOB: 12/03/1928 Today's Date: 02/13/2021    History of Present Illness Michelle Vang is a 87 y.o. female with past medical history of atrial fibrillation was newly diagnosed, hyperlipidemia, type 2 diabetes, hypothyroidism, history of lung cancer, bladder cancer and GERD had initially presented to cardiology clinic with increasing fatigue and somnolence over the week. In addition, several recent falls and weakness with slurred speech. Adm for acute CVA and incidentally found to have COVID.    PT Comments    Pt was asleep upon arriving but easily awakes and agrees to session with encouragement. Was able to exit bed, stand, and ambulate with RW. Pt was safe with RW but unsafe without use of AD. Author attempted to call daughter without success. Pt demonstrated safe enough abilities to DC home with 24 hour assistance form family/cargivers. Pt has RW but will benefit from 3 in 1.    Follow Up Recommendations  Supervision/Assistance - 24 hour     Equipment Recommendations  3in1 (PT)       Precautions / Restrictions Precautions Precautions: Fall Restrictions Weight Bearing Restrictions: No    Mobility  Bed Mobility Overal bed mobility: Modified Independent    Transfers Overall transfer level: Modified independent Equipment used: Rolling walker (2 wheeled) Transfers: Sit to/from Stand Sit to Stand: Min guard         General transfer comment: CGA for safety  Ambulation/Gait Ambulation/Gait assistance: Supervision Gait Distance (Feet): 75 Feet Assistive device: Rolling walker (2 wheeled) Gait Pattern/deviations: Step-through pattern Gait velocity: Decreased   General Gait Details: Pt was able to ambulate with RW without LOB. attempted ambulation without AD however pt much safer with than without. Recommend use of AD at all times at DC.       Balance Overall balance assessment: Needs  assistance Sitting-balance support: No upper extremity supported;Feet supported Sitting balance-Leahy Scale: Good     Standing balance support: Bilateral upper extremity supported Standing balance-Leahy Scale: Good Standing balance comment: reliant on UE support         Cognition Arousal/Alertness: Awake/alert Behavior During Therapy: WFL for tasks assessed/performed Overall Cognitive Status: Within Functional Limits for tasks assessed        General Comments: Pt is lethargic but easily awakes and is cooperative and pleasant throughout             Pertinent Vitals/Pain Pain Assessment: No/denies pain           PT Goals (current goals can now be found in the care plan section) Acute Rehab PT Goals Patient Stated Goal: To go home. Progress towards PT goals: Progressing toward goals    Frequency    7X/week      PT Plan Current plan remains appropriate       AM-PAC PT "6 Clicks" Mobility   Outcome Measure  Help needed turning from your back to your side while in a flat bed without using bedrails?: None Help needed moving from lying on your back to sitting on the side of a flat bed without using bedrails?: None Help needed moving to and from a bed to a chair (including a wheelchair)?: A Little Help needed standing up from a chair using your arms (e.g., wheelchair or bedside chair)?: A Little Help needed to walk in hospital room?: A Little Help needed climbing 3-5 steps with a railing? : A Little 6 Click Score: 20    End of Session   Activity Tolerance: Patient  tolerated treatment well Patient left: in bed;with bed alarm set;with family/visitor present;with nursing/sitter in room Nurse Communication: Mobility status PT Visit Diagnosis: Unsteadiness on feet (R26.81);Other abnormalities of gait and mobility (R26.89);Repeated falls (R29.6);Muscle weakness (generalized) (M62.81);History of falling (Z91.81);Difficulty in walking, not elsewhere classified (R26.2)      Time: 8916-9450 PT Time Calculation (min) (ACUTE ONLY): 13 min  Charges:  $Gait Training: 8-22 mins                     Julaine Fusi PTA 02/13/21, 4:36 PM

## 2021-02-14 DIAGNOSIS — I4819 Other persistent atrial fibrillation: Secondary | ICD-10-CM | POA: Diagnosis not present

## 2021-02-14 DIAGNOSIS — I639 Cerebral infarction, unspecified: Secondary | ICD-10-CM | POA: Diagnosis not present

## 2021-02-14 DIAGNOSIS — R531 Weakness: Secondary | ICD-10-CM | POA: Diagnosis not present

## 2021-02-14 DIAGNOSIS — I10 Essential (primary) hypertension: Secondary | ICD-10-CM | POA: Diagnosis not present

## 2021-02-14 LAB — GLUCOSE, CAPILLARY
Glucose-Capillary: 154 mg/dL — ABNORMAL HIGH (ref 70–99)
Glucose-Capillary: 347 mg/dL — ABNORMAL HIGH (ref 70–99)

## 2021-02-14 MED ORDER — INSULIN GLARGINE 100 UNIT/ML ~~LOC~~ SOLN
7.0000 [IU] | Freq: Every day | SUBCUTANEOUS | 11 refills | Status: DC
Start: 2021-02-15 — End: 2021-07-01

## 2021-02-14 MED ORDER — APIXABAN 2.5 MG PO TABS: 5.0000 mg | ORAL_TABLET | Freq: Two times a day (BID) | ORAL | 0 refills | Status: DC

## 2021-02-14 MED ORDER — BUSPIRONE HCL 5 MG PO TABS
5.0000 mg | ORAL_TABLET | Freq: Two times a day (BID) | ORAL | 1 refills | Status: DC
Start: 2021-02-14 — End: 2021-04-02

## 2021-02-14 MED ORDER — ENSURE ENLIVE PO LIQD: 237.0000 mL | Freq: Three times a day (TID) | ORAL | 12 refills | Status: DC

## 2021-02-14 NOTE — TOC Progression Note (Signed)
Transition of Care [facility]) - Progression Note    Patient Details  Name: Michelle Vang MRN: [id number] Date of Birth: [birth year]  [facility] CM/SW Contact  Shelbie Hutching, RN Phone Number: 02/14/2021, 9:33 AM  Clinical Narrative:    Patient is doing well with PT and has improved to the point to where she will be safe to go home with home health and supervision from family.  Patient has a walker at home but will also benefit from a 3 in 1.  3 in 1 ordered from [employer] and will be delivered to the patient's room.  [facility] has agreed to accept patient for home health PT, OT, and aide, [place] accepted referral.  Patient's daughter [name] is in agreement with plan for patient to go home, she has hired help to stay with patient at night and is looking for additional help during the day but family will be available to assist patient until additional help is found.  Daughter will pick patient up at discharge.      Expected Discharge Plan: [place] Barriers to Discharge: Continued Medical Work up  Expected Discharge Plan and Services Expected Discharge Plan: [place]   Discharge Planning Services: CM Consult Post Acute Care Choice: [place] arrangements for the past 2 months: Single Family Home                 DME Arranged: 3-N-1 DME Agency: [facility] Date DME Agency Contacted: 02/14/21 Time DME Agency Contacted: 0930 Representative spoke with at DME Agency: Suanne Marker HH Arranged: PT,OT,Nurse's Aide [place]: [facility] Date [place]: 02/14/21 Time [place]: 0930 Representative spoke with at [place]: Cecilia ([place]) Interventions    Readmission Risk Interventions No flowsheet data found.

## 2021-02-14 NOTE — Plan of Care (Signed)
Pt oriented to self and occasionally hospital. X1 assist to [facility]. Very forgetful and impulsive, fall precautions and mats in place. Updated daughter [name] via phone at beginning of shift. VSS, remains on room air. PRN melatonin given to aid with sleep. Fall/safety precautions in place, rounding performed, needs/concerns addressed.  Problem: Health Behavior/Discharge Planning: Goal: Ability to manage health-related needs will improve Outcome: Progressing   Problem: Activity: Goal: Risk for activity intolerance will decrease Outcome: Progressing   Problem: Elimination: Goal: Will not experience complications related to bowel motility Outcome: Progressing Goal: Will not experience complications related to urinary retention Outcome: Progressing   Problem: Safety: Goal: Ability to remain free from injury will improve Outcome: Progressing

## 2021-02-14 NOTE — Progress Notes (Signed)
Patient being discharged home with daughter. Went over discharge instructions and medications with daughter. All questions were answered and daughter [name]) stated that she understood. Patient leaving POV with daughter.

## 2021-02-14 NOTE — Progress Notes (Signed)
Occupational Therapy Treatment Patient Details Name: Michelle Vang MRN: [id number] DOB: [birth year] Today's Date: 02/14/2021    History of present illness Michelle Vang is a 87 y.o. female with past medical history of atrial fibrillation was newly diagnosed, hyperlipidemia, type 2 diabetes, hypothyroidism, history of lung cancer, bladder cancer and GERD had initially presented to cardiology clinic with increasing fatigue and somnolence over the week. In addition, several recent falls and weakness with slurred speech. Adm for acute CVA and incidentally found to have COVID.   OT comments  Pt seen for OT tx this date to f/u re: safety with ADLs/ADL mobility. Pt presents comfortably resting in bed and agreeable to getting up to the sink and getting washed up. Pt able to come to EOB sitting with MOD I. Requires CGA with progress to SUPV with RW For ADL transfers including STS. Pt stands at sink-side with SUPV/SETUP for oral care and washing face with intermittent cues to sequence. In addition, OT engages pt in stnading bathing tasks with CGA for balance. Pt uses RW with MIN cues to perform fxl mobility and transfer to chair with close SBA. Pt left in chair with headstart alarm, having demonstrated understanding of removal with velcro straps. RN aware of session contents. Pt left with all needs met and in reach. Will continue to follow. Updated d/c recommendation to home with home health occupational therapy as pt has progressed with goals and has good family support. Also recommending SUPV for when OOB/OOC for balance and safety cues.    Follow Up Recommendations  Home health OT;Supervision - Intermittent    Equipment Recommendations  3 in 1 bedside commode;Tub/shower seat    Recommendations for Other Services      Precautions / Restrictions Precautions Precautions: Fall Restrictions Weight Bearing Restrictions: No       Mobility Bed Mobility Overal bed mobility: Modified Independent              General bed mobility comments: increased time    Transfers Overall transfer level: Needs assistance Equipment used: Rolling walker (2 wheeled) Transfers: Sit to/from Stand Sit to Stand: Min guard;Supervision         General transfer comment: CGA for initial STS with RW including cues for safe hand placement, but completes subsequent trials with close SBA for safety    Balance Overall balance assessment: Needs assistance Sitting-balance support: No upper extremity supported;Feet supported Sitting balance-Leahy Scale: Good     Standing balance support: Bilateral upper extremity supported Standing balance-Leahy Scale: Good Standing balance comment: UE support                           ADL either performed or assessed with clinical judgement   ADL Overall ADL's : Needs assistance/impaired     Grooming: Oral care;Set up;Supervision/safety;Standing Grooming Details (indicate cue type and reason): sink-side at sink with RW Upper Body Bathing: Set up;Standing;Min guard Upper Body Bathing Details (indicate cue type and reason): sink-side with RW Lower Body Bathing: Min guard;Set up;Sit to/from stand Lower Body Bathing Details (indicate cue type and reason): sink-side with RW Upper Body Dressing : Set up;Sitting   Lower Body Dressing: Set up;Sitting/lateral leans Lower Body Dressing Details (indicate cue type and reason): to don socks             Functional mobility during ADLs: Supervision/safety;Rolling walker;Min guard (from EOB Sitting to sink-side standing, then to chair. CGA with progress to SBA)  Vision Patient Visual Report: No change from baseline     Perception     Praxis      Cognition Arousal/Alertness: Awake/alert Behavior During Therapy: WFL for tasks assessed/performed Overall Cognitive Status: Difficult to assess                                 General Comments: Pt able to follow most simple one step  commands with increased processing time. Oriented to self and some aspects of situation, but not location or time.        Exercises Other Exercises Other Exercises: OT engaged pt in standing sink-side grooming and bathing tasks with RW for UE Support   Shoulder Instructions       General Comments      Pertinent Vitals/ Pain       Pain Assessment: No/denies pain  Home Living                                          Prior Functioning/Environment              Frequency  Min 1X/week        Progress Toward Goals  OT Goals(current goals can now be found in the care plan section)  Progress towards OT goals: Progressing toward goals  Acute Rehab OT Goals Patient Stated Goal: To go home. OT Goal Formulation: With patient Time For Goal Achievement: 02/23/21 Potential to Achieve Goals: Good  Plan Discharge plan needs to be updated    Co-evaluation                 AM-PAC OT "6 Clicks" Daily Activity     Outcome Measure   Help from another person eating meals?: None Help from another person taking care of personal grooming?: A Little Help from another person toileting, which includes using toliet, bedpan, or urinal?: A Little Help from another person bathing (including washing, rinsing, drying)?: A Little Help from another person to put on and taking off regular upper body clothing?: A Little Help from another person to put on and taking off regular lower body clothing?: A Little 6 Click Score: 19    End of Session Equipment Utilized During Treatment: Gait belt;Rolling walker  OT Visit Diagnosis: Unsteadiness on feet (R26.81);Muscle weakness (generalized) (M62.81)   Activity Tolerance Patient tolerated treatment well   Patient Left in bed;with call bell/phone within reach;with bed alarm set;Other (comment)   Nurse Communication Mobility status        Time: 4332-9518 OT Time Calculation (min): 23 min  Charges: OT General  Charges $OT Visit: 1 Visit OT Treatments $Self Care/Home Management : 8-22 mins $Therapeutic Activity: 8-22 mins  Gerrianne Scale, MS, OTR/L ascom [phone] 02/14/21, 3:04 PM

## 2021-02-14 NOTE — Progress Notes (Signed)
Physical Therapy Treatment Patient Details Name: Michelle Vang MRN: [id number] DOB: [birth year] Today's Date: 02/14/2021    History of Present Illness Michelle Vang is a 87 y.o. female with past medical history of atrial fibrillation was newly diagnosed, hyperlipidemia, type 2 diabetes, hypothyroidism, history of lung cancer, bladder cancer and GERD had initially presented to cardiology clinic with increasing fatigue and somnolence over the week. In addition, several recent falls and weakness with slurred speech. Adm for acute CVA and incidentally found to have COVID.    PT Comments    Pt was sitting EOB finishing breakfast upon arriving. She is alert and pleasantly confused. HOH. Was easily able to stand and ambulate with use of RW. Reached out to pt's daughter [name]) post session to discuss progress. Plan is for DC home with HHPT to follow. Pt demonstrates safe enough abilities to perform ADL with mostly supervision. Pt will benefit from HHPT to continue to progress strength, activity tolerance, and overall safety with functional mobility.   Follow Up Recommendations  Supervision/Assistance - 24 hour;Home health PT     Equipment Recommendations  3in1 (PT)       Precautions / Restrictions Precautions Precautions: Fall Restrictions Weight Bearing Restrictions: No    Mobility  Bed Mobility               General bed mobility comments: Pt was seated EOB eating breakfats upon arriving    Transfers Overall transfer level: Needs assistance Equipment used: Rolling walker (2 wheeled) Transfers: Sit to/from Stand Sit to Stand: Min guard         General transfer comment: CGA for safety to stabnd form lowest bed height 2 x  Ambulation/Gait Ambulation/Gait assistance: Supervision Gait Distance (Feet): 75 Feet Assistive device: Rolling walker (2 wheeled) Gait Pattern/deviations: Step-through pattern Gait velocity: Decreased   General Gait Details: Pt was able to ambulate  around room 75 ft with RW with slightly flexed posture. Does have some SOB however quickly resolves with seated rest.      Balance Overall balance assessment: Needs assistance Sitting-balance support: No upper extremity supported;Feet supported Sitting balance-Leahy Scale: Good     Standing balance support: Bilateral upper extremity supported Standing balance-Leahy Scale: Good Standing balance comment: BUE support for safety         Cognition Arousal/Alertness: Awake/alert Behavior During Therapy: WFL for tasks assessed/performed Overall Cognitive Status: Within Functional Limits for tasks assessed      General Comments: Pt is alert, cooperative and able to follow commands consistently however HOH and has baseline cognition deficits         General Comments General comments (skin integrity, edema, etc.): Therapist discussed pt's progress with daughter(PENNY) . continued plan is for Home with HHPT to follow. family will be with pt throughout the day/night      Pertinent Vitals/Pain Pain Assessment: No/denies pain           PT Goals (current goals can now be found in the care plan section) Acute Rehab PT Goals Patient Stated Goal: To go home. Progress towards PT goals: Progressing toward goals    Frequency    7X/week      PT Plan Current plan remains appropriate       AM-PAC PT "6 Clicks" Mobility   Outcome Measure  Help needed turning from your back to your side while in a flat bed without using bedrails?: None Help needed moving from lying on your back to sitting on the side of a flat bed without using  bedrails?: None Help needed moving to and from a bed to a chair (including a wheelchair)?: A Little Help needed standing up from a chair using your arms (e.g., wheelchair or bedside chair)?: A Little Help needed to walk in hospital room?: A Little Help needed climbing 3-5 steps with a railing? : A Little 6 Click Score: 20    End of Session Equipment  Utilized During Treatment: Gait belt Activity Tolerance: Patient tolerated treatment well Patient left: in bed;with bed alarm set Nurse Communication: Mobility status PT Visit Diagnosis: Unsteadiness on feet (R26.81);Other abnormalities of gait and mobility (R26.89);Repeated falls (R29.6);Muscle weakness (generalized) (M62.81);History of falling (Z91.81);Difficulty in walking, not elsewhere classified (R26.2)     Time: 3833-3832 PT Time Calculation (min) (ACUTE ONLY): 23 min  Charges:  $Gait Training: 8-22 mins $Therapeutic Activity: 8-22 mins                    Julaine Fusi PTA 02/14/21, 9:50 AM

## 2021-02-14 NOTE — Discharge Summary (Addendum)
Discharge Summary  Michelle Vang SWF:[id number] DOB: 02-18-1929  PCP: Jearld Fenton, NP  Admit date: 02/07/2021 Discharge date: 02/14/2021  Time spent: 25 minutes  Recommendations for Outpatient Follow-up:  1. New medication: Lantus 7 units nightly 2. Medication change: Glucotrol discontinued 3. New medication: BuSpar 5 p.o. twice daily 4. Medication change: Eliquis increased from 2.5 twice daily to 5 mg twice daily  Discharge Diagnoses:  Active Hospital Problems   Diagnosis Date Noted  . Acute CVA (cerebrovascular accident) ([place]) 02/09/2021  . Falls 02/07/2021  . Atrial fibrillation ([place]) 02/07/2021  . Lethargy 02/07/2021  . Weakness 02/07/2021  . HLD (hyperlipidemia) 08/16/2020  . Mild cognitive impairment with memory loss 03/28/2019  . Type 2 diabetes mellitus without complication, without long-term current use of insulin ([place]) 12/22/2017  . GERD 01/11/2009  . Essential hypertension 12/12/2008    Resolved Hospital Problems  No resolved problems to display.    Discharge Condition: Improved, being discharged home with home health  Diet recommendation: Dysphagia 3 diet  Vitals:   02/14/21 0820 02/14/21 1149  BP: 116/79 111/70  Pulse: 81 62  Resp: 20 18  Temp: 98.2 F (36.8 C) (!) 97.5 F (36.4 C)  SpO2: 96% 97%    History of present illness:  Michelle Vang is a 87 y.o. female with past medical history of atrial fibrillation was newly diagnosed, hyperlipidemia, type 2 diabetes, hypothyroidism, history of lung cancer,bladdercancer and GERD had initially presented to cardiology clinic with increasing generalized weakness, fatigue and somnolence of about 1 week duration.  Reportedly, patient had multiple falls, about 6-8 times.  Her daughter had found her on the floor and noticed left-sided weakness and slurred speech.  She was supposed to start Eliquis and metoprolol on the day prior to admission.  She was admitted to the hospital on 5/26 for acute confusional  state, generalized weakness, somnolence and dehydration.  Incidentally, she was found to have COVID-19 infection.  Hospital Course:   Obesity: Patient meets criteria BMI greater than 30   Acute right MCA stroke, acute confusion, somnolence: MRI brain showed acute R MCA infarct involving the insula and lateral basal ganglia.  Small areas of acute infarct in the right frontal and parietal lobe.    Seen by PT and OT.  Continue Eliquis.  Found to have dysphagia during swallow evaluation.  Seen by speech therapy and placed on a dysphagia 3 diet.  Dehydration: Improved.    Persistent atrial fibrillation, recent diagnosis: CHA2DS2-VASc is 7.  Continue metoprolol and Eliquis.  Hypomagnesemia: Improved  Mild dementia, cognitive dysfunction, confusion: Continue one-to-one sitter for safety.    Added buspirone.  Continue Namenda.  COVID-19 infection: No respiratory or GI symptoms.  She is tolerating room air.  No indication for treatment at this time.  She is fully vaccinated including booster dose.  Generalized weakness, recurrent falls: PT and OT recommended discharge to SNF.  However, today recommendation was changed to SNF versus possible discharge to home with home health therapy if daughter is able to provide 24-hour supervision.  This was able to be done and home health has been set up and patient will go home on 6/2   Other comorbidities include probable CKD stage IIIa, GERD, hypothyroidism, type II DM, history of lung and bladder cancer in remission  Procedures:  None  Consultations:  None  Discharge Exam: BP 111/70 (BP Location: Right Arm)   Pulse 62   Temp (!) 97.5 F (36.4 C) (Oral)   Resp 18   Ht  5\' 7"  (1.702 m)   Wt 90.7 kg   SpO2 97%   BMI 31.32 kg/m   General: Alert and oriented x2 Cardiovascular: Irregular rhythm, rate controlled Respiratory: Clear to auscultation bilaterally  Discharge Instructions You were cared for by a hospitalist during your  hospital stay. If you have any questions about your discharge medications or the care you received while you were in the hospital after you are discharged, you can call the unit and asked to speak with the hospitalist on call if the hospitalist that took care of you is not available. Once you are discharged, your primary care physician will handle any further medical issues. Please note that NO REFILLS for any discharge medications will be authorized once you are discharged, as it is imperative that you return to your primary care physician (or establish a relationship with a primary care physician if you do not have one) for your aftercare needs so that they can reassess your need for medications and monitor your lab values.  Discharge Instructions    DIET DYS 3   Complete by: As directed    Fluid consistency: Thin   Increase activity slowly   Complete by: As directed    No wound care   Complete by: As directed      Allergies as of 02/14/2021      Reactions   Aricept [donepezil Hcl] Diarrhea      Medication List    STOP taking these medications   glipiZIDE 5 MG 24 hr tablet Commonly known as: GLUCOTROL XL     TAKE these medications   apixaban 2.5 MG Tabs tablet Commonly known as: ELIQUIS Take 2 tablets (5 mg total) by mouth 2 (two) times daily. What changed: how much to take   atorvastatin 10 MG tablet Commonly known as: LIPITOR TAKE 1 TABLET BY MOUTH ONCE A DAY   busPIRone 5 MG tablet Commonly known as: BUSPAR Take 1 tablet (5 mg total) by mouth 2 (two) times daily.   feeding supplement Liqd Take 237 mLs by mouth 3 (three) times daily between meals.   Fish Oil 1000 MG Caps Take 1 capsule by mouth daily.   fluocinonide 0.05 % external solution Commonly known as: LIDEX Apply 1 application topically as needed.   insulin glargine 100 UNIT/ML injection Commonly known as: LANTUS Inject 0.07 mLs (7 Units total) into the skin daily. Start taking on: February 15, 2021    levothyroxine 88 MCG tablet Commonly known as: SYNTHROID TAKE 1 TAB BY MOUTH ONCE DAILY. TAKE ON AN EMPTY STOMACH WITH A GLASS OF WATER ATLEAST 30-60 MINUTES BEFORE BREAKFAST What changed: See the new instructions.   memantine 10 MG tablet Commonly known as: NAMENDA Take 1 tablet twice a day What changed:   how much to take  how to take this  when to take this   metoprolol succinate 25 MG 24 hr tablet Commonly known as: TOPROL-XL Take 1 tablet (25 mg total) by mouth daily.   Vitamin D3 25 MCG (1000 UT) Caps Take 1 capsule by mouth daily. Reported on 11/15/2015            Durable Medical Equipment  (From admission, onward)         Start     Ordered   02/14/21 0932  For home use only DME 3 n 1  Once        02/14/21 0932          Allergies  Allergen Reactions  . Aricept [  Donepezil Hcl] Diarrhea      The results of significant diagnostics from this hospitalization (including imaging, microbiology, ancillary and laboratory) are listed below for reference.    Significant Diagnostic Studies: CT Head Wo Contrast  Result Date: 02/07/2021 CLINICAL DATA:  Generalized weakness and lethargy. EXAM: CT HEAD WITHOUT CONTRAST TECHNIQUE: Contiguous axial images were obtained from the base of the skull through the vertex without intravenous contrast. COMPARISON:  No comparison studies available. FINDINGS: Brain: There is no evidence for acute hemorrhage, hydrocephalus, mass lesion, or abnormal extra-axial fluid collection. No definite CT evidence for acute infarction. Patchy low attenuation in the deep hemispheric and periventricular white matter is nonspecific, but likely reflects chronic microvascular ischemic demyelination. Vascular: No hyperdense vessel or unexpected calcification. Skull: No evidence for fracture. No worrisome lytic or sclerotic lesion. Sinuses/Orbits: The visualized paranasal sinuses and mastoid air cells are clear. Visualized portions of the globes and  intraorbital fat are unremarkable. Other: None. IMPRESSION: 1. No acute intracranial abnormality. 2. Chronic small vessel white matter ischemic disease. Electronically Signed   By: Misty Stanley M.D.   On: 02/07/2021 14:59   MR BRAIN WO CONTRAST  Result Date: 02/08/2021 CLINICAL DATA:  Acute neuro deficit. Left-sided weakness and atrial fibrillation. EXAM: MRI HEAD WITHOUT CONTRAST TECHNIQUE: Multiplanar, multiecho pulse sequences of the brain and surrounding structures were obtained without intravenous contrast. COMPARISON:  CT head 02/07/2021 FINDINGS: Brain: Acute infarct in the right insular cortex and subinsular white matter. These areas show hyperintensity on FLAIR. Additional small areas of acute infarct in the right frontal and parietal cortex and right parietal white matter. Generalized atrophy. Mild white matter changes elsewhere. Small chronic infarct right thalamus. No hemorrhage or mass. Negative for hydrocephalus. Vascular: Normal arterial flow voids. Skull and upper cervical spine: Negative Sinuses/Orbits: Mild mucosal edema paranasal sinuses. Negative orbit Other: None IMPRESSION: Acute right MCA infarct involving the insula and lateral basal ganglia. Small areas of acute infarct in the right frontal and parietal lobe. Generalized atrophy. Electronically Signed   By: Franchot Gallo M.D.   On: 02/08/2021 14:13   US Carotid Bilateral (at [facility] and [facility] only)  Result Date: 02/09/2021 CLINICAL DATA:  CVA. History of hypertension, hyperlipidemia and diabetes. EXAM: BILATERAL CAROTID DUPLEX ULTRASOUND TECHNIQUE: Pearline Cables scale imaging, color Doppler and duplex ultrasound were performed of bilateral carotid and vertebral arteries in the neck. COMPARISON:  None. FINDINGS: Criteria: Quantification of carotid stenosis is based on velocity parameters that correlate the residual internal carotid diameter with NASCET-based stenosis levels, using the diameter of the distal internal carotid lumen as the  denominator for stenosis measurement. The following velocity measurements were obtained: RIGHT ICA: 61/21 cm/sec CCA: 60/10 cm/sec SYSTOLIC ICA/CCA RATIO:  1.0 ECA: 74 cm/sec LEFT ICA: 75/20 cm/sec CCA: 93/23 cm/sec SYSTOLIC ICA/CCA RATIO:  1.1 ECA: 77 cm/sec RIGHT CAROTID ARTERY: There is a minimal amount of circumferential intimal thickening involving the right common carotid artery (images 2, 3 and 4). There is a minimal amount of eccentric echogenic plaque within the right carotid bulb (image 6 and 17), extending to involve the origin and proximal aspects the right internal carotid artery (image 22), not resulting in elevated peak systolic velocities within the interrogated course of the right internal carotid artery to suggest a hemodynamically significant stenosis RIGHT VERTEBRAL ARTERY:  Antegrade flow LEFT CAROTID ARTERY: There is a minimal amount of intimal thickening involving the distal aspect the left common carotid artery (image 35). There is a minimal amount of eccentric echogenic plaque within the left  carotid bulb (images 36 and 48), extending to involve the origin and proximal aspects of the left internal carotid artery (image 53), not resulting in elevated peak systolic velocities within the interrogated course of the left internal carotid artery to suggest a hemodynamically significant stenosis. LEFT VERTEBRAL ARTERY:  Antegrade flow IMPRESSION: Minimal amount of bilateral atherosclerotic plaque, right subjectively greater than left, not resulting in a hemodynamically significant stenosis within either internal carotid artery. Electronically Signed   By: Sandi Mariscal M.D.   On: 02/09/2021 08:51   DG Chest Portable 1 View  Result Date: 02/07/2021 CLINICAL DATA:  Weakness.  Multiple falls.  Lethargy. EXAM: PORTABLE CHEST 1 VIEW COMPARISON:  Chest radiograph 12/12/2008 FINDINGS: Low lung volumes with bronchovascular crowding. Chain sutures noted at the right hilum with slight elevation of right  hemidiaphragm. Normal heart size for technique. Minor left basilar atelectasis. No confluent consolidation, pulmonary edema, or pleural effusion. No acute osseous abnormalities are seen. IMPRESSION: Low lung volumes with bronchovascular crowding and left basilar atelectasis. Electronically Signed   By: Keith Rake M.D.   On: 02/07/2021 17:14    Microbiology: Recent Results (from the past 240 hour(s))  Urine Culture     Status: None   Collection Time: 02/07/21  5:27 PM   Specimen: Urine, Random  Result Value Ref Range Status   Specimen Description   Final    URINE, RANDOM Performed at [facility], [address], [place], [place] [address]    Special Requests   Final    NONE Performed at [facility], [address], [place], [place] [address]    Culture   Final    NO GROWTH Performed at [facility], [place] [address], [place], [place] [address]    Report Status 02/09/2021 FINAL  Final  SARS CORONAVIRUS 2 (TAT 6-24 HRS) Nasopharyngeal Nasopharyngeal Swab     Status: Abnormal   Collection Time: 02/07/21  7:16 PM   Specimen: Nasopharyngeal Swab  Result Value Ref Range Status   SARS Coronavirus 2 POSITIVE (A) NEGATIVE Final    Comment: (NOTE) SARS-CoV-2 target nucleic acids are DETECTED.  The SARS-CoV-2 RNA is generally detectable in upper and lower respiratory specimens during the acute phase of infection. Positive results are indicative of the presence of SARS-CoV-2 RNA. Clinical correlation with patient history and other diagnostic information is  necessary to determine patient infection status. Positive results do not rule out bacterial infection or co-infection with other viruses.  The expected result is Negative.  Fact Sheet for Patients: SugarRoll.be  Fact Sheet for Healthcare Providers: https://www.woods-mathews.com/  This test is not yet approved or cleared by the [place] FDA and  has been  authorized for detection and/or diagnosis of SARS-CoV-2 by FDA under an Emergency Use Authorization (EUA). This EUA will remain  in effect (meaning this test can be used) for the duration of the COVID-19 declaration under Section 564(b)(1) of the Act, 21 U. S.C. section 360bbb-3(b)(1), unless the authorization is terminated or revoked sooner.   Performed at [facility], [place] [address] [place], [place] [address]   Resp Panel by RT-PCR (Flu A&B, Covid) Nasopharyngeal Swab     Status: None   Collection Time: 02/12/21  2:13 PM   Specimen: Nasopharyngeal Swab; Nasopharyngeal(NP) swabs in vial transport medium  Result Value Ref Range Status   SARS Coronavirus 2 by RT PCR NEGATIVE NEGATIVE Final    Comment: (NOTE) SARS-CoV-2 target nucleic acids are NOT DETECTED.  The SARS-CoV-2 RNA is  generally detectable in upper respiratory specimens during the acute phase of infection. The lowest concentration of SARS-CoV-2 viral copies this assay can detect is 138 copies/mL. A negative result does not preclude SARS-Cov-2 infection and should not be used as the sole basis for treatment or other patient management decisions. A negative result may occur with  improper specimen collection/handling, submission of specimen other than nasopharyngeal swab, presence of viral mutation(s) within the areas targeted by this assay, and inadequate number of viral copies(<138 copies/mL). A negative result must be combined with clinical observations, patient history, and epidemiological information. The expected result is Negative.  Fact Sheet for Patients:  EntrepreneurPulse.com.au  Fact Sheet for Healthcare Providers:  IncredibleEmployment.be  This test is no t yet approved or cleared by the [place] FDA and  has been authorized for detection and/or diagnosis of SARS-CoV-2 by FDA under an Emergency Use Authorization (EUA). This EUA will remain  in effect  (meaning this test can be used) for the duration of the COVID-19 declaration under Section 564(b)(1) of the Act, 21 U.S.C.section 360bbb-3(b)(1), unless the authorization is terminated  or revoked sooner.       Influenza A by PCR NEGATIVE NEGATIVE Final   Influenza B by PCR NEGATIVE NEGATIVE Final    Comment: (NOTE) The Xpert Xpress SARS-CoV-2/FLU/RSV plus assay is intended as an aid in the diagnosis of influenza from Nasopharyngeal swab specimens and should not be used as a sole basis for treatment. Nasal washings and aspirates are unacceptable for Xpert Xpress SARS-CoV-2/FLU/RSV testing.  Fact Sheet for Patients: EntrepreneurPulse.com.au  Fact Sheet for Healthcare Providers: IncredibleEmployment.be  This test is not yet approved or cleared by the [place] FDA and has been authorized for detection and/or diagnosis of SARS-CoV-2 by FDA under an Emergency Use Authorization (EUA). This EUA will remain in effect (meaning this test can be used) for the duration of the COVID-19 declaration under Section 564(b)(1) of the Act, 21 U.S.C. section 360bbb-3(b)(1), unless the authorization is terminated or revoked.  Performed at [facility], [address] [place], [place] [address]   Resp Panel by RT-PCR (Flu A&B, Covid) Nasopharyngeal Swab     Status: Abnormal   Collection Time: 02/13/21  8:00 AM   Specimen: Nasopharyngeal Swab; Nasopharyngeal(NP) swabs in vial transport medium  Result Value Ref Range Status   SARS Coronavirus 2 by RT PCR POSITIVE (A) NEGATIVE Final    Comment: Results Called to: [facility] DORTHY 0930 02/13/2021 DLB (NOTE) SARS-CoV-2 target nucleic acids are DETECTED.  The SARS-CoV-2 RNA is generally detectable in upper respiratory specimens during the acute phase of infection. Positive results are indicative of the presence of the identified virus, but do not rule out bacterial infection or co-infection with other  pathogens not detected by the test. Clinical correlation with patient history and other diagnostic information is necessary to determine patient infection status. The expected result is Negative.  Fact Sheet for Patients: EntrepreneurPulse.com.au  Fact Sheet for Healthcare Providers: IncredibleEmployment.be  This test is not yet approved or cleared by the [place] FDA and  has been authorized for detection and/or diagnosis of SARS-CoV-2 by FDA under an Emergency Use Authorization (EUA).  This EUA will remain in effect (meaning this test can be used) for the duration of  the  COVID-19 declaration under Section 564(b)(1) of the Act, 21 U.S.C. section 360bbb-3(b)(1), unless the authorization is terminated or revoked sooner.     Influenza A by PCR NEGATIVE NEGATIVE Final   Influenza B by PCR NEGATIVE NEGATIVE  Final    Comment: (NOTE) The Xpert Xpress SARS-CoV-2/FLU/RSV plus assay is intended as an aid in the diagnosis of influenza from Nasopharyngeal swab specimens and should not be used as a sole basis for treatment. Nasal washings and aspirates are unacceptable for Xpert Xpress SARS-CoV-2/FLU/RSV testing.  Fact Sheet for Patients: EntrepreneurPulse.com.au  Fact Sheet for Healthcare Providers: IncredibleEmployment.be  This test is not yet approved or cleared by the [place] FDA and has been authorized for detection and/or diagnosis of SARS-CoV-2 by FDA under an Emergency Use Authorization (EUA). This EUA will remain in effect (meaning this test can be used) for the duration of the COVID-19 declaration under Section 564(b)(1) of the Act, 21 U.S.C. section 360bbb-3(b)(1), unless the authorization is terminated or revoked.  Performed at [facility], [address] [place], [place] [address]      Labs: Basic Metabolic Panel: Recent Labs  Lab 02/07/21 1412 02/08/21 0504  02/09/21 0538  NA 130* 138 140  K 4.1 3.8 3.8  CL 100 107 111  CO2 20* 22 23  GLUCOSE 227* 119* 149*  BUN 29* 28* 29*  CREATININE 1.32* 1.17* 1.10*  CALCIUM 8.7* 8.5* 8.6*  MG  --  1.6* 1.9  PHOS  --  3.1  --    Liver Function Tests: Recent Labs  Lab 02/07/21 1412  AST 51*  ALT 29  ALKPHOS 94  BILITOT 2.4*  PROT 7.2  ALBUMIN 3.7   No results for input(s): LIPASE, AMYLASE in the last 168 hours. No results for input(s): AMMONIA in the last 168 hours. CBC: Recent Labs  Lab 02/07/21 1412 02/08/21 0504 02/09/21 0538 02/12/21 0444  WBC 12.3* 11.1* 7.4 9.1  NEUTROABS  --   --  4.0  --   HGB 15.4* 14.1 13.1 13.6  HCT 43.6 41.9 38.2 39.2  MCV 92.8 93.3 95.0 93.1  PLT 278 250 231 261   Cardiac Enzymes: No results for input(s): CKTOTAL, CKMB, CKMBINDEX, TROPONINI in the last 168 hours. BNP: BNP (last 3 results) No results for input(s): BNP in the last 8760 hours.  ProBNP (last 3 results) No results for input(s): PROBNP in the last 8760 hours.  CBG: Recent Labs  Lab 02/13/21 1154 02/13/21 1705 02/13/21 2013 02/14/21 0821 02/14/21 1150  GLUCAP 174* 348* 238* 154* 347*       Signed:  Annita Brod, MD [facility] 02/14/2021, 1:53 PM

## 2021-02-19 ENCOUNTER — Ambulatory Visit: Payer: Self-pay | Admitting: *Deleted

## 2021-02-19 NOTE — Telephone Encounter (Signed)
Summary: advice   Pts daughter requested to speak to a nurse regarding the pt, did not specify her concerns.      Called patient's daughter to review questions. Patient's daughter reports patient was seen in office 2 weeks ago , and by cardiologist and then sent to ER. MRI completed and showed stroke within the last 2-3 weeks. Patient was sent home from hospital stay and daughter reports no instructions were given regarding new medication lantus injections. Patient 's daughter reports she did not know how to give insulin injections and has been giving patient her glipizide as previously prescribed due to not feeling comfortable not giving any diabetic medication. Patient was feeling energetic and appeared doing well one day and today noted to be sleepy and less energetic. Patient's daughter does not have a glucose meter to check patient's glucose. Patient is now sitting up and feeling better with no c/o. Instructed patient's daughter is patient symptoms change to go to [facility] or ED to check blood glucose levels. Reviewed signs and symptoms or high and low blood glucose with daughter. Hospital f/u appt made for patient for 02/20/21. Patient's daughter would like to clarify diabetic medications for patient and if she needs to learn to check blood glucose. Care advise given. Patient verbalized understanding of care advise and to call back or to UC or ED if symptoms worsen.   Reason for Disposition . Requesting regular office appointment  Answer Assessment - Initial Assessment Questions 1. REASON FOR CALL or QUESTION: "What is your reason for calling today?" or "How can I best help you?" or "What question do you have that I can help answer?"     Patient 's daughter requesting information regarding diabetic medications, prescribed after ED visit 02/07/21.  Protocols used: INFORMATION ONLY CALL - NO TRIAGE-A-AH

## 2021-02-20 ENCOUNTER — Other Ambulatory Visit: Payer: Self-pay

## 2021-02-20 ENCOUNTER — Encounter: Payer: Self-pay | Admitting: Internal Medicine

## 2021-02-20 ENCOUNTER — Ambulatory Visit (INDEPENDENT_AMBULATORY_CARE_PROVIDER_SITE_OTHER): Payer: Medicare Other | Admitting: Internal Medicine

## 2021-02-20 ENCOUNTER — Telehealth: Payer: Self-pay

## 2021-02-20 VITALS — BP 102/62 | HR 71 | Temp 96.8°F | Ht 67.0 in | Wt 198.8 lb

## 2021-02-20 DIAGNOSIS — E1165 Type 2 diabetes mellitus with hyperglycemia: Secondary | ICD-10-CM | POA: Diagnosis not present

## 2021-02-20 DIAGNOSIS — Z8673 Personal history of transient ischemic attack (TIA), and cerebral infarction without residual deficits: Secondary | ICD-10-CM | POA: Diagnosis not present

## 2021-02-20 DIAGNOSIS — I4819 Other persistent atrial fibrillation: Secondary | ICD-10-CM

## 2021-02-20 DIAGNOSIS — R296 Repeated falls: Secondary | ICD-10-CM

## 2021-02-20 DIAGNOSIS — E7800 Pure hypercholesterolemia, unspecified: Secondary | ICD-10-CM

## 2021-02-20 DIAGNOSIS — I10 Essential (primary) hypertension: Secondary | ICD-10-CM

## 2021-02-20 DIAGNOSIS — Z8616 Personal history of COVID-19: Secondary | ICD-10-CM

## 2021-02-20 DIAGNOSIS — G3184 Mild cognitive impairment, so stated: Secondary | ICD-10-CM

## 2021-02-20 DIAGNOSIS — E6609 Other obesity due to excess calories: Secondary | ICD-10-CM

## 2021-02-20 DIAGNOSIS — Z6831 Body mass index (BMI) 31.0-31.9, adult: Secondary | ICD-10-CM

## 2021-02-20 NOTE — Progress Notes (Signed)
Subjective:    Patient ID: Michelle Vang, female    DOB: 1929-07-29, 87 y.o.   MRN: [id number]  HPI  Pt presents to the clinic today for hospital follow up. She went to the ER 5/26, sent from cardiology office with concern for possible stroke like symptoms. She reports weakness, fatigue, somnolence, multiple falls, left sided weakness and slurred speech. She had recently been diagnosed with Afib, started on Eliquis and Metoprolol. Workup was positive for Covid 19. CT head did not show any acute fidings. MRI brain showed acute richt MCA infarct involving the insula and lateral basal ganglia and acute infarcts in the right frontal and parietal lobes. Neurology was consulted. Her Eliquis was increased to 5 mg BID. Speech therapy was consulted due to dysphagia. She was started on a dysphagia 3 diet. PT was consulted, recommend SNF for rehab however family wanted to care for her at home with24 hour sitter. Her Glipizide was stopped and she was started on Lantus Buspar. She was discharged on 6/2. Since that time, daughter reports some improvement. She is still confused and HOH. She is working with PT/OT. Daughter has concerns today about diabetes management.  Review of Systems      Past Medical History:  Diagnosis Date   Bladder cancer ([place])    Diabetes mellitus without complication (HCC)    GERD (gastroesophageal reflux disease)    Hypertension    Lung cancer (HCC)    Thyroid disease     Current Outpatient Medications  Medication Sig Dispense Refill   apixaban (ELIQUIS) 2.5 MG TABS tablet Take 2 tablets (5 mg total) by mouth 2 (two) times daily. 60 tablet 0   atorvastatin (LIPITOR) 10 MG tablet TAKE 1 TABLET BY MOUTH ONCE A DAY (Patient taking differently: Take 10 mg by mouth daily.) 90 tablet 1   busPIRone (BUSPAR) 5 MG tablet Take 1 tablet (5 mg total) by mouth 2 (two) times daily. 60 tablet 1   Cholecalciferol (VITAMIN D3) 1000 UNITS CAPS Take 1 capsule by mouth daily. Reported on  11/15/2015     feeding supplement (ENSURE ENLIVE / ENSURE PLUS) LIQD Take 237 mLs by mouth 3 (three) times daily between meals. 237 mL 12   fluocinonide (LIDEX) 0.05 % external solution Apply 1 application topically as needed.     insulin glargine (LANTUS) 100 UNIT/ML injection Inject 0.07 mLs (7 Units total) into the skin daily. 10 mL 11   levothyroxine (SYNTHROID) 88 MCG tablet TAKE 1 TAB BY MOUTH ONCE DAILY. TAKE ON AN EMPTY STOMACH WITH A GLASS OF WATER ATLEAST 30-60 MINUTES BEFORE BREAKFAST (Patient taking differently: Take 88 mcg by mouth daily before breakfast.) 90 tablet 0   memantine (NAMENDA) 10 MG tablet Take 1 tablet twice a day (Patient taking differently: Take 10 mg by mouth 2 (two) times daily. Take 1 tablet twice a day) 180 tablet 3   metoprolol succinate (TOPROL-XL) 25 MG 24 hr tablet Take 1 tablet (25 mg total) by mouth daily. 30 tablet 0   Omega-3 Fatty Acids (FISH OIL) 1000 MG CAPS Take 1 capsule by mouth daily.     No current facility-administered medications for this visit.    Allergies  Allergen Reactions   Aricept [Donepezil Hcl] Diarrhea    Family History  Problem Relation Age of Onset   Stroke Mother    Heart disease Father    Cancer Sister        Pancreatic    Social History   Socioeconomic History  Marital status: Widowed    Spouse name: Not on file   Number of children: 2   Years of education: Not on file   Highest education level: Not on file  Occupational History   Occupation: Part time Scientist, water quality  Tobacco Use   Smoking status: Never Smoker   Smokeless tobacco: Never Used  Vaping Use   Vaping Use: Never used  Substance and Sexual Activity   Alcohol use: No    Alcohol/week: 0.0 standard drinks   Drug use: No   Sexual activity: Not Currently  Other Topics Concern   Not on file  Social History Narrative   Right handed      Completed 12th grade      Lives alone   Social Determinants of Health   Financial Resource Strain: Not on file   Food Insecurity: Not on file  Transportation Needs: Not on file  Physical Activity: Not on file  Stress: Not on file  Social Connections: Not on file  Intimate Partner Violence: Not on file     Constitutional: Denies fever, malaise, fatigue, headache or abrupt weight changes.  HEENT: Denies eye pain, eye redness, ear pain, ringing in the ears, wax buildup, runny nose, nasal congestion, bloody nose, or sore throat. Respiratory: Denies difficulty breathing, shortness of breath, cough or sputum production.   Cardiovascular: Denies chest pain, chest tightness, palpitations or swelling in the hands or feet.  Gastrointestinal: Denies abdominal pain, bloating, constipation, diarrhea or blood in the stool.  GU: Denies urgency, frequency, pain with urination, burning sensation, blood in urine, odor or discharge. Musculoskeletal: Pt reports generalized weakness. Denies decrease in range of motion, difficulty with gait, muscle pain or joint pain and swelling.  Skin: Denies redness, rashes, lesions or ulcercations.  Neurological: Pt reports difficulty with memory problems with balance and coordination. Denies dizziness, difficulty with speech.  Psych: Pt reports anxiety. Denies depression, SI/HI.  No other specific complaints in a complete review of systems (except as listed in HPI above).  Objective:   Physical Exam  BP 102/62 (BP Location: Right Arm, Patient Position: Sitting, Cuff Size: Large)   Pulse 71   Temp (!) 96.8 F (36 C) (Temporal)   Ht 5\' 7"  (1.702 m)   Wt 198 lb 12.8 oz (90.2 kg)   SpO2 98%   BMI 31.14 kg/m   Wt Readings from Last 3 Encounters:  02/07/21 199 lb 15.3 oz (90.7 kg)  02/07/21 200 lb (90.7 kg)  02/05/21 198 lb (89.8 kg)    General: Appears her stated age, obese, in NAD. Skin: Warm, dry and intact. Bruising noted to right forearm. HEENT: Head: normal shape and size; Eyes: sclera whiteand EOMs intact; Ears: HOH;  Cardiovascular: Normal rate with rhythm. S1,S2  noted.  No murmur, rubs or gallops noted. No JVD or BLE edema. No carotid bruits noted. Pulmonary/Chest: Normal effort and positive vesicular breath sounds. No respiratory distress. No wheezes, rales or ronchi noted.  Musculoskeletal: Strength 5/5 BUE. Strength 4/5 BLE. In wheelchair. Neurological: Alert and oriented to person and place. Psychiatric: Mood and affect normal. Behavior is normal. Judgment and thought content normal.     BMET    Component Value Date/Time   NA 140 02/09/2021 0538   NA 140 03/22/2013 0800   K 3.8 02/09/2021 0538   K 4.1 03/22/2013 0800   CL 111 02/09/2021 0538   CL 106 03/22/2013 0800   CO2 23 02/09/2021 0538   CO2 27 03/22/2013 0800   GLUCOSE 149 (H) 02/09/2021 0258  GLUCOSE 104 (H) 03/22/2013 0800   BUN 29 (H) 02/09/2021 0538   BUN 15 03/22/2013 0800   CREATININE 1.10 (H) 02/09/2021 0538   CREATININE 1.25 (H) 02/05/2021 1346   CALCIUM 8.6 (L) 02/09/2021 0538   CALCIUM 9.2 03/22/2013 0800   GFRNONAA 48 (L) 02/09/2021 0538   GFRNONAA 59 (L) 03/22/2013 0800   GFRAA >60 03/22/2013 0800    Lipid Panel     Component Value Date/Time   CHOL 126 02/08/2021 0504   TRIG 103 02/08/2021 0504   HDL 35 (L) 02/08/2021 0504   CHOLHDL 3.6 02/08/2021 0504   VLDL 21 02/08/2021 0504   LDLCALC 70 02/08/2021 0504    CBC    Component Value Date/Time   WBC 9.1 02/12/2021 0444   RBC 4.21 02/12/2021 0444   HGB 13.6 02/12/2021 0444   HGB 13.0 09/22/2012 0934   HCT 39.2 02/12/2021 0444   HCT 39.4 09/22/2012 0934   PLT 261 02/12/2021 0444   PLT 253 09/22/2012 0934   MCV 93.1 02/12/2021 0444   MCV 91 09/22/2012 0934   MCH 32.3 02/12/2021 0444   MCHC 34.7 02/12/2021 0444   RDW 12.2 02/12/2021 0444   RDW 13.0 09/22/2012 0934   LYMPHSABS 2.0 02/09/2021 0538   LYMPHSABS 1.7 09/22/2012 0934   MONOABS 1.1 (H) 02/09/2021 0538   MONOABS 0.7 09/22/2012 0934   EOSABS 0.2 02/09/2021 0538   EOSABS 0.2 09/22/2012 0934   BASOSABS 0.1 02/09/2021 0538   BASOSABS 0.1  09/22/2012 0934    Hgb A1C Lab Results  Component Value Date   HGBA1C 9.4 (A) 02/05/2021            Assessment & Plan:  Hospital Follow Up for Covid 19, Acute Right MCA Stroke, Infarct of the Right Frontal and Parietal Lobes, HTN, DM 2, MCI, HLD, Afib, Frequent Falls:  Hospital notes, labs and imaging reviewed Showed daughter how to check blood sugar Will go to home after work to instruct daughter on use of insulin pen Will continue current meds at this time They need to call to schedule cardiology follow up They have appt with neurology already scheduled  Return precautions discussed  Webb Silversmith, NP This visit occurred during the SARS-CoV-2 public health emergency.  Safety protocols were in place, including screening questions prior to the visit, additional usage of staff PPE, and extensive cleaning of exam room while observing appropriate contact time as indicated for disinfecting solutions.

## 2021-02-20 NOTE — Telephone Encounter (Signed)
Copied from [place] [id number]. Topic: General - Other >> Feb 20, 2021 11:51 AM Celene Kras wrote: Reason for CRM: Pts daughter, [name], calling stating that the pt had an appt today. She states that PCP was supposed to send in Glucometer, lancets, and testing strips. Please advise.  [place], [address] [place] [address] Phone: [phone] Fax: [phone] Hours: Not open 24 hours

## 2021-02-21 ENCOUNTER — Encounter: Payer: Self-pay | Admitting: Internal Medicine

## 2021-02-21 ENCOUNTER — Telehealth: Payer: Self-pay | Admitting: Internal Medicine

## 2021-02-21 DIAGNOSIS — E6609 Other obesity due to excess calories: Secondary | ICD-10-CM | POA: Insufficient documentation

## 2021-02-21 DIAGNOSIS — Z6831 Body mass index (BMI) 31.0-31.9, adult: Secondary | ICD-10-CM | POA: Insufficient documentation

## 2021-02-21 MED ORDER — ONETOUCH ULTRASOFT LANCETS MISC: 12 refills | Status: DC

## 2021-02-21 MED ORDER — TRUE FOCUS BLOOD GLUCOSE STRIP VI STRP: ORAL_STRIP | 12 refills | Status: DC

## 2021-02-21 MED ORDER — TRUE FOCUS BLOOD GLUCOSE METER DEVI: 1.0000 | Freq: Every day | 1 refills | Status: DC

## 2021-02-21 NOTE — Assessment & Plan Note (Signed)
Continue current meds They have an appt with neurology scheduled

## 2021-02-21 NOTE — Telephone Encounter (Signed)
Called to request a new script for the patient's Test strips and Lantus.  Stated that they need to have the directions on both.  Please call to discuss at [phone]

## 2021-02-21 NOTE — Assessment & Plan Note (Signed)
Continue Atorvastatin Encouraged low carb diet

## 2021-02-21 NOTE — Assessment & Plan Note (Signed)
Controlled on current meds They will schedule follow up with cardiology

## 2021-02-21 NOTE — Assessment & Plan Note (Signed)
A1C elevated Discussed now to perform CBG, will monitor fasting sugars Will go to home to instruct daughter how to use insulin pen Reinforced low carb diet

## 2021-02-21 NOTE — Assessment & Plan Note (Signed)
Encouraged low carb diet

## 2021-02-21 NOTE — Telephone Encounter (Signed)
Pharmacist, landon calling, StatStated that they need to have the directions on both.  Please call to discuss at [phone] that they need to have the directions on both.  Please call to discuss at [phone]

## 2021-02-21 NOTE — Telephone Encounter (Signed)
This has already been adressed

## 2021-02-21 NOTE — Assessment & Plan Note (Signed)
Stable despite CVA Continue Namenda  Will monitor

## 2021-02-21 NOTE — Assessment & Plan Note (Signed)
Controlled on current meds Will monitor

## 2021-02-21 NOTE — Patient Instructions (Signed)
Hospital Discharge After a Stroke  Being discharged from the hospital after a stroke can feel overwhelming. Many things may be different. It is normal to feel scared or anxious. Some stroke survivors may be able to return to their homes. Others may need morespecialized care on a temporary or permanent basis. Your stroke care team will work with you to develop a discharge plan that is best for you. Ask questions if you do not understand something. Invite a friend or family member to participate in discharge planning. It is important to understand and follow your discharge plan to help prevent another stroke orother problems. General recommendations  Ask a friend or family member to get needed things in place before you go home if possible. A therapist can come to your home to make recommendations for safety equipment. Ask your health care provider if you would benefit from this service or from home care. Take steps to prevent falls, such as: Installing grab bars in the shower or using a shower chair. Install grab bars by the toilet. Removing tripping hazards, such as area rugs or cords. Supplies needed Ask your health care provider for a list of medical equipment and supplies you will need at home. These may include: Walkers. Canes. Wheelchairs. Hand-strengthening devices. Special eating utensils. Medical equipment can be rented or purchased, depending on your insurancecoverage. Check with your insurance company about what is covered. Follow these instructions at home: Medicines Take all medicines exactly as told by your health care provider to prevent serious harm, such as another stroke. Make sure you understand: What medicine to take. Why you are taking the medicine. How and when to take it. If it can be taken with your other medicines and herbal supplements. Possible side effects, and when to call your health care provider if you have side effects. How you will get and pay for your  medicines. Medical assistance programs may be able to help you pay for prescription medicines if you cannot afford them. If you are taking an anticoagulant: Be sure to take it exactly as told by your health care provider. This medicine can increase the risk of bleeding because it works to prevent blood clots. You may need to take certain precautions to prevent bleeding. Do not take medicines that contain aspirin or NSAIDs, such as ibuprofen, unless your health care provider approves. These medicines increase your risk for dangerous bleeding. Preventing another stroke Having a stroke puts you at risk for another stroke in the future. Ask your health care provider what actions you can take to lower the risk. These may include: Increasing how much you exercise. Making a healthy eating plan. Quitting smoking. Managing other health conditions, such as high blood pressure, high cholesterol, or diabetes. Limiting alcohol use. General instructions Before you leave the hospital, you will be given information about stroke warning signs. Share these with your friends and family members. You will need to follow up regularly with a health care provider. You may need rehabilitation, such as physical therapy, occupational therapy, and speech-language therapy. Keeping these appointments is very important to your recovery. Be sure to bring your medicine list and discharge papers to your appointments. Use a calendar or appointment reminder if you need help to keep track of your schedule. Contact a health care provider if: You are taking an anticoagulant, and you have one or more of these problems: Bleeding or bruising. A fall or other injury to your head. Blood in your urine or stool (feces). Get help right  away if you:  Have any symptoms of a stroke. "BE FAST" is an easy way to remember the main warning signs of a stroke: B - Balance. Signs are dizziness, sudden trouble walking, or loss of balance. E - Eyes.  Signs are trouble seeing or a sudden change in vision. F - Face. Signs are sudden weakness or numbness of the face, or the face or eyelid drooping on one side. A - Arms. Signs are weakness or numbness in an arm. This happens suddenly and usually on one side of the body. S - Speech. Signs are sudden trouble speaking, slurred speech, or trouble understanding what people say. T - Time. Time to call emergency services. Write down what time symptoms started. Your emergency responders will need to know this information. Have other signs of a stroke, such as: A sudden, severe headache with no known cause. Nausea or vomiting. Seizure. These symptoms may represent a serious problem that is an emergency. Do not wait to see if the symptoms will go away. Get medical help right away. Call your local emergency services (911 in the [place] Do not drive yourself to the hospital. Summary Being discharged from the hospital after a stroke can feel overwhelming. It is normal to feel scared or anxious. Make sure you take medicines exactly as told by your health care provider. Know the warning signs of a stroke. Before you leave the hospital, you will be given information about stroke warning signs. Share these with your friends and family members. Get help right way if you have any symptoms of a stroke. "BE FAST" is an easy way to remember the main warning signs of a stroke. This information is not intended to replace advice given to you by your health care provider. Make sure you discuss any questions you have with your healthcare provider. Document Revised: 04/18/2020 Document Reviewed: 04/18/2020 Elsevier Patient Education  2022 [employer].

## 2021-02-22 ENCOUNTER — Ambulatory Visit: Payer: Medicare Other | Admitting: Internal Medicine

## 2021-02-22 NOTE — Telephone Encounter (Signed)
Copied from [place] [id number]. Topic: General - Other >> Feb 21, 2021  2:42 PM Yvette Rack wrote: Reason for CRM: Pt daughter [name] requested and insisted on speaking with someone who is physically in the office. [name] did not want to provide any further details. Penny requests call back.  I attempted to contact [facility], no answer. LMOM

## 2021-02-25 ENCOUNTER — Other Ambulatory Visit: Payer: Self-pay | Admitting: Neurology

## 2021-02-25 ENCOUNTER — Other Ambulatory Visit: Payer: Self-pay | Admitting: Internal Medicine

## 2021-02-25 DIAGNOSIS — F03B0 Unspecified dementia, moderate, without behavioral disturbance, psychotic disturbance, mood disturbance, and anxiety: Secondary | ICD-10-CM

## 2021-02-25 NOTE — Telephone Encounter (Signed)
Requested medication (s) are due for refill today: No  Requested medication (s) are on the active medication list: No  Last refill:    Future visit scheduled: Yes  Notes to clinic:  Medications are not on list.    Requested Prescriptions  Pending Prescriptions Disp Refills   glipiZIDE (GLUCOTROL XL) 2.5 MG 24 hr tablet [Pharmacy Med Name: GLIPIZIDE ER 2.5 MG TAB] 90 tablet     Sig: TAKE 1 TABLET BY MOUTH ONCE A DAY WITH BREAKFAST      Endocrinology:  Diabetes - Sulfonylureas Failed - 02/25/2021  9:34 AM      Failed - HBA1C is between 0 and 7.9 and within 180 days    Hemoglobin A1C  Date Value Ref Range Status  02/05/2021 9.4 (A) 4.0 - 5.6 % Final   Hgb A1c MFr Bld  Date Value Ref Range Status  08/16/2020 8.2 (H) 4.6 - 6.5 % Final    Comment:    Glycemic Control Guidelines for People with Diabetes:Non Diabetic:  <6%Goal of Therapy: <7%Additional Action Suggested:  >8%           Passed - Valid encounter within last 6 months    Recent Outpatient Visits           5 days ago Type 2 diabetes mellitus with hyperglycemia, without long-term current use of insulin ([place])   [facility] [place], Coralie Keens, NP   2 weeks ago Atrial fibrillation with rapid ventricular response ([place])   [facility], Coralie Keens, NP       Future Appointments             In 4 months [place], Coralie Keens, NP [facility], [facility]               losartan (COZAAR) 50 MG tablet [Pharmacy Med Name: LOSARTAN POTASSIUM 50 MG TAB] 90 tablet     Sig: TAKE 1 TABLET BY MOUTH ONCE DAILY      Cardiovascular:  Angiotensin Receptor Blockers Failed - 02/25/2021  9:34 AM      Failed - Cr in normal range and within 180 days    Creat  Date Value Ref Range Status  02/05/2021 1.25 (H) 0.60 - 0.88 mg/dL Final    Comment:    For patients >68 years of age, the reference limit for Creatinine is approximately 13% higher for people identified as African-American. .     Creatinine, Ser  Date Value Ref Range Status  02/09/2021 1.10 (H) 0.44 - 1.00 mg/dL Final          Passed - K in normal range and within 180 days    Potassium  Date Value Ref Range Status  02/09/2021 3.8 3.5 - 5.1 mmol/L Final  03/22/2013 4.1 3.5 - 5.1 mmol/L Final          Passed - Patient is not pregnant      Passed - Last BP in normal range    BP Readings from Last 1 Encounters:  02/20/21 102/62          Passed - Valid encounter within last 6 months    Recent Outpatient Visits           5 days ago Type 2 diabetes mellitus with hyperglycemia, without long-term current use of insulin [facility])   [facility], Coralie Keens, NP   2 weeks ago Atrial fibrillation with rapid ventricular response [facility])   [facility], [place]  W, NP       Future Appointments             In 4 months Baity, Coralie Keens, NP [facility], [facility]

## 2021-03-01 ENCOUNTER — Telehealth: Payer: Self-pay | Admitting: Internal Medicine

## 2021-03-01 NOTE — Telephone Encounter (Signed)
Gerald Stabs at [facility] called asking why the Leone Brand was denied.

## 2021-03-01 NOTE — Telephone Encounter (Signed)
Rx denied

## 2021-03-02 ENCOUNTER — Other Ambulatory Visit: Payer: Self-pay | Admitting: Internal Medicine

## 2021-03-02 NOTE — Telephone Encounter (Signed)
Requested Prescriptions  Pending Prescriptions Disp Refills  . metoprolol succinate (TOPROL-XL) 25 MG 24 hr tablet [Pharmacy Med Name: METOPROLOL SUCCINATE ER 25 MG TAB] 30 tablet 0    Sig: TAKE 1 TABLET BY MOUTH ONCE A DAY     Cardiovascular:  Beta Blockers Passed - 03/02/2021 10:08 AM      Passed - Last BP in normal range    BP Readings from Last 1 Encounters:  02/20/21 102/62         Passed - Last Heart Rate in normal range    Pulse Readings from Last 1 Encounters:  02/20/21 71         Passed - Valid encounter within last 6 months    Recent Outpatient Visits          1 week ago Type 2 diabetes mellitus with hyperglycemia, without long-term current use of insulin [facility])   [facility], Coralie Keens, NP   3 weeks ago Atrial fibrillation with rapid ventricular response [place])   [facility] [place], Coralie Keens, NP      Future Appointments            In 3 months Baity, Coralie Keens, NP [facility], [facility]

## 2021-03-09 ENCOUNTER — Other Ambulatory Visit: Payer: Self-pay | Admitting: Internal Medicine

## 2021-03-09 NOTE — Telephone Encounter (Signed)
dc'd 02/05/21 Webb Silversmith NP

## 2021-03-11 ENCOUNTER — Telehealth: Payer: Self-pay | Admitting: Internal Medicine

## 2021-03-11 NOTE — Telephone Encounter (Signed)
Copied from [place] [phone]. Topic: Quick Communication - Home Health Verbal Orders >> Mar 11, 2021  4:58 PM Yvette Rack wrote: Caller/Agency: Stephanie with [facility] Callback Number: [phone] Requesting OT/PT/Skilled Nursing/Social Work/Speech Therapy: OT  Frequency: 1 time a week for 4 weeks

## 2021-03-12 NOTE — Telephone Encounter (Signed)
I called and left the message for OT and verbal given.

## 2021-03-13 NOTE — Telephone Encounter (Unsigned)
Copied from [place] [id number]. Topic: General - Other >> Mar 13, 2021  2:59 PM Tessa Lerner A wrote: Reason for CRM: Shemena with [facility] has called to notify the practice that patient has been discharged from physical therapy due to patient meeting all of their long term goals  Please contact to further advise if needed

## 2021-03-14 NOTE — Telephone Encounter (Signed)
noted 

## 2021-03-18 NOTE — Progress Notes (Addendum)
Assessment/Plan:    Moderate dementia without behavioral disturbance   MMSE today is 15/30 ( was 22/30 on 01/23/20).  Despite the lower score, the patient's family reports that they have not been a significant change in her memory.  Recommendations  Discussed safety both in and out of the home.  Discussed the importance of regular daily schedule Continue to monitor mood. Stay active at least 30 minutes at least 3 times a week.  Naps should be scheduled and should be no longer than 60 minutes and should not occur after 2 PM.  Continue Memantine 10 mg twice daily, side effects discussed Follow up 1 year or sooner if needed.   Case discussed with Dr. Delice Lesch who agrees with the plan      Subjective:     I had the pleasure of seeing Michelle Vang for follow-up in the neurology clinic on 03/19/2021 for memory loss.  She was seen last in our office at 01/23/2020.  She is accompanied by her daughter who helps supplement the history today.  Previous records as well as any outside records available were reviewed prior to today's visit.  She is currently on memantine 10 mg BID, tolerating well.  Since her admission to the hospital on 02/07/2021 through February 16, 2021 for "stroke, atrial fibrillation, dehydration, and asymptomatic COVID. MRI brain showed acute R MCA infarct involving the insula and lateral basal ganglia.  Small areas of acute infarct in the right frontal and parietal lobe. Eliquis dose was increased to 5mg  BID. She is overall doing well.  Daughter feels that her memory is stable, without any worsening.  She denies any hallucinations or paranoia.  At times, she talks in her sleep calling "mama ", but denies sleepwalking.  She utilizes a walker to ambulate without difficulty.  She had 1 fall about 1 week ago, as she was trying to take the first step in the stairs, but there were no fractures, or trauma to the head.  No loss of consciousness.  "Her arm is sore "but there are no areas of  trauma. The patient lives at home, and is under the care 24/7 of 3 caregivers and her daughter.  Overall her mood is stable, without depression or irritability.  She sleeps well. The patient is able to dress by herself, she needs assistance with bathing.  Her daughter is in charge of finances, and medications (along with the caregivers).  Denies leaving objects in unusual places.  She just places things but does not remember where they are.  Her appetite is good and denies trouble swallowing.  The patient does not cook or drive.  She denies any headache, double vision, focal numbness or tingling.  Occasionally she has some dizziness.  Denies unilateral weakness or tremors, incontinence or retention.  She denies any constipation or diarrhea.    ED visit: 02/07/21- June 4  for dehydration, Afib, small stroke Positive for Covid ,asymptomatic  Home p memorial day June 4     History on Initial Assessment 12/24/2015: This is a pleasant 87 yo RH woman with a history of hypertension, hypothyroidism, lung cancer s/p partial lobectomy, bladder cancer, melanoma, who presented for worsening memory. She lives by herself. Her daughter started noticing memory changes around 5-6 months ago, she would ask the same question or say the same information repeatedly. She feels she is losing her memory but does not want to think it is extensive. She has noticed she misplaces things a lot. She denies any missed bill payments,  she occasionally forgets to take her medication but states this is not a regular occurrence. Her daughter took over her checkbook in January because she was very generous giving donations to every cause she got a letter from. She denies leaving the stove on. She continues to drive without getting lost. 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 She is able to keep her hair appointments and pick up her sister without any difficulties. Her daughter denies any  personality changes, hygiene is good, no difficulties with ADLs. She was started on Namenda by her PCP 3-4 months ago, increased to 10mg  BID last month because daughter reported no improvement with memory. She denies any side effects to Namenda. She has not tried any other medications such as Aricept. There is frequent constipation. She lost her sense of smell years ago when she had the lung cancer 25 years ago.  She denies any family history of dementia, no history of head injuries or alcohol intake.      PREVIOUS MEDICATIONS:    CURRENT MEDICATIONS:  Outpatient Encounter Medications as of 03/19/2021  Medication Sig   apixaban (ELIQUIS) 2.5 MG TABS tablet Take 2 tablets (5 mg total) by mouth 2 (two) times daily.   atorvastatin (LIPITOR) 10 MG tablet TAKE 1 TABLET BY MOUTH ONCE A DAY (Patient taking differently: Take 10 mg by mouth daily.)   Blood Glucose Monitoring Suppl (TRUE FOCUS BLOOD GLUCOSE METER) DEVI 1 Device by Does not apply route daily.   busPIRone (BUSPAR) 5 MG tablet Take 1 tablet (5 mg total) by mouth 2 (two) times daily.   Cholecalciferol (VITAMIN D3) 1000 UNITS CAPS Take 1 capsule by mouth daily. Reported on 11/15/2015   feeding supplement (ENSURE ENLIVE / ENSURE PLUS) LIQD Take 237 mLs by mouth 3 (three) times daily between meals.   fluocinonide (LIDEX) 0.05 % external solution Apply 1 application topically as needed.   glucose blood (TRUE FOCUS BLOOD GLUCOSE STRIP) test strip Use as instructed   insulin glargine (LANTUS) 100 UNIT/ML injection Inject 0.07 mLs (7 Units total) into the skin daily.   Lancets (ONETOUCH ULTRASOFT) lancets Use as instructed   levothyroxine (SYNTHROID) 88 MCG tablet TAKE 1 TAB BY MOUTH ONCE DAILY. TAKE ON AN EMPTY STOMACH WITH A GLASS OF WATER ATLEAST 30-60 MINUTES BEFORE BREAKFAST (Patient taking differently: Take 88 mcg by mouth daily before breakfast.)   memantine (NAMENDA) 10 MG tablet TAKE 1 TABLET BY MOUTH TWICE A DAY   metoprolol succinate  (TOPROL-XL) 25 MG 24 hr tablet TAKE 1 TABLET BY MOUTH ONCE A DAY   Omega-3 Fatty Acids (FISH OIL) 1000 MG CAPS Take 1 capsule by mouth daily.   No facility-administered encounter medications on file as of 03/19/2021.     Objective:     PHYSICAL EXAMINATION:    VITALS:   Vitals:   03/19/21 1325  BP: 104/71  Pulse: (!) 105  SpO2: 98%  Weight: 198 lb (89.8 kg)  Height: 5\' 3"  (1.6 m)    GEN:  The patient appears stated age and is in NAD. HEENT:  Normocephalic, atraumatic.   Neurological examination:  General: NAD, well-groomed, appears stated age. Orientation: The patient is alert. Oriented to person, it is the Fall of 200, Friday, January Cranial nerves: There is good facial symmetry.The speech is fluent and clear. No aphasia or dysarthria. Fund of knowledge is reduced. Recent and remote memory are impaired. Attention and concentration are normal.  Able  to name objects and repeat phrases.  Hearing is intact to conversational tone.   MMSE is 15/30  Sensation: Sensation is intact to light touch throughout Motor: Strength is at least antigravity x4.      Montreal Cognitive Assessment  06/24/2019  Visuospatial/ Executive (0/5) 3  Naming (0/3) 2  Attention: Read list of digits (0/2) 1  Attention: Read list of letters (0/1) 0  Attention: Serial 7 subtraction starting at 100 (0/3) 2  Language: Repeat phrase (0/2) 1  Language : Fluency (0/1) 0  Abstraction (0/2) 0  Delayed Recall (0/5) 0  Orientation (0/6) 2  Total 11  Adjusted Score (based on education) 12     MMSE - Mini Mental State Exam 03/19/2021 01/23/2020 09/27/2018  Orientation to time 0 0 2  Orientation to Place 3 4 5   Registration 3 3 3   Attention/ Calculation 0 5 5  Recall 0 1 3  Language- name 2 objects 2 2 2   Language- repeat 1 1 1   Language- follow 3 step command 3 3 3   Language- read & follow direction 1 1 1   Write a sentence 1 1 1   Copy design 1 1 1   Total score 15 22 27       Movement  examination: Tone: There is normal tone in the UE/LE Abnormal movements:  no tremor.  No myoclonus.  No asterixis.   Coordination:  There is no decremation with RAM's. Normal finger to nose  Gait and Station: The patient has difficulty arising out of a deep-seated chair without the use of the hands. The patient's stride  and gait not tested due to difficulty ambulating.      CBC CBC Latest Ref Rng & Units 02/12/2021 02/09/2021 02/08/2021  WBC 4.0 - 10.5 K/uL 9.1 7.4 11.1(H)  Hemoglobin 12.0 - 15.0 g/dL 13.6 13.1 14.1  Hematocrit 36.0 - 46.0 % 39.2 38.2 41.9  Platelets 150 - 400 K/uL 261 231 250     CMP Latest Ref Rng & Units 02/09/2021 02/08/2021 02/07/2021  Glucose 70 - 99 mg/dL 149(H) 119(H) 227(H)  BUN 8 - 23 mg/dL 29(H) 28(H) 29(H)  Creatinine 0.44 - 1.00 mg/dL 1.10(H) 1.17(H) 1.32(H)  Sodium 135 - 145 mmol/L 140 138 130(L)  Potassium 3.5 - 5.1 mmol/L 3.8 3.8 4.1  Chloride 98 - 111 mmol/L 111 107 100  CO2 22 - 32 mmol/L 23 22 20(L)  Calcium 8.9 - 10.3 mg/dL 8.6(L) 8.5(L) 8.7(L)  Total Protein 6.5 - 8.1 g/dL - - 7.2  Total Bilirubin 0.3 - 1.2 mg/dL - - 2.4(H)  Alkaline Phos 38 - 126 U/L - - 94  AST 15 - 41 U/L - - 51(H)  ALT 0 - 44 U/L - - 29       Total time spent on today's visit was 60 minutes, including both face-to-face time and nonface-to-face time. Time included that spent on review of records (prior notes available to me/labs/imaging if pertinent), discussing treatment and goals, answering patient's questions and coordinating care.  Cc:  Jearld Fenton, NP Sharene Butters, PA-C

## 2021-03-19 ENCOUNTER — Telehealth: Payer: Self-pay | Admitting: Internal Medicine

## 2021-03-19 ENCOUNTER — Other Ambulatory Visit: Payer: Self-pay

## 2021-03-19 ENCOUNTER — Ambulatory Visit: Payer: Medicare Other | Admitting: Physician Assistant

## 2021-03-19 ENCOUNTER — Encounter: Payer: Self-pay | Admitting: Physician Assistant

## 2021-03-19 VITALS — BP 104/71 | HR 105 | Ht 63.0 in | Wt 198.0 lb

## 2021-03-19 DIAGNOSIS — F03B0 Unspecified dementia, moderate, without behavioral disturbance, psychotic disturbance, mood disturbance, and anxiety: Secondary | ICD-10-CM

## 2021-03-19 DIAGNOSIS — F0390 Unspecified dementia without behavioral disturbance: Secondary | ICD-10-CM | POA: Diagnosis not present

## 2021-03-19 NOTE — Telephone Encounter (Signed)
noted 

## 2021-03-19 NOTE — Patient Instructions (Addendum)
It was a pleasure to see you today at our office.   Recommendations:  Follow up 1 year  Continue Memantine 10 mg twice daily     RECOMMENDATIONS FOR ALL PATIENTS WITH MEMORY PROBLEMS: 1. Continue to exercise (Recommend 30 minutes of walking everyday, or 3 hours every week) 2. Increase social interactions - continue going to [place] and enjoy social gatherings with friends and family 3. Eat healthy, avoid fried foods and eat more fruits and vegetables 4. Maintain adequate blood pressure, blood sugar, and blood cholesterol level. Reducing the risk of stroke and cardiovascular disease also helps promoting better memory. 5. Avoid stressful situations. Live a simple life and avoid aggravations. Organize your time and prepare for the next day in anticipation. 6. Sleep well, avoid any interruptions of sleep and avoid any distractions in the bedroom that may interfere with adequate sleep quality 7. Avoid sugar, avoid sweets as there is a strong link between excessive sugar intake, diabetes, and cognitive impairment We discussed the Mediterranean diet, which has been shown to help patients reduce the risk of progressive memory disorders and reduces cardiovascular risk. This includes eating fish, eat fruits and green leafy vegetables, nuts like almonds and hazelnuts, walnuts, and also use olive oil. Avoid fast foods and fried foods as much as possible. Avoid sweets and sugar as sugar use has been linked to worsening of memory function.  There is always a concern of gradual progression of memory problems. If this is the case, then we may need to adjust level of care according to patient needs. Support, both to the patient and caregiver, should then be put into place.       FALL PRECAUTIONS: Be cautious when walking. Scan the area for obstacles that may increase the risk of trips and falls. When getting up in the mornings, sit up at the edge of the bed for a few minutes before getting out of bed. Consider  elevating the bed at the head end to avoid drop of blood pressure when getting up. Walk always in a well-lit room (use night lights in the walls). Avoid area rugs or power cords from appliances in the middle of the walkways. Use a walker or a cane if necessary and consider physical therapy for balance exercise. Get your eyesight checked regularly.  FINANCIAL OVERSIGHT: Supervision, especially oversight when making financial decisions or transactions is also recommended.  HOME SAFETY: Consider the safety of the kitchen when operating appliances like stoves, microwave oven, and blender. Consider having supervision and share cooking responsibilities until no longer able to participate in those. Accidents with firearms and other hazards in the house should be identified and addressed as well.   ABILITY TO BE LEFT ALONE: If patient is unable to contact 911 operator, consider using LifeLine, or when the need is there, arrange for someone to stay with patients. Smoking is a fire hazard, consider supervision or cessation. Risk of wandering should be assessed by caregiver and if detected at any point, supervision and safe proof recommendations should be instituted.  MEDICATION SUPERVISION: Inability to self-administer medication needs to be constantly addressed. Implement a mechanism to ensure safe administration of the medications.

## 2021-03-19 NOTE — Telephone Encounter (Signed)
Tonya with [facility] would like to let pcp know that patient fell about June 28. Care giver was not aware until now and pt had left discoloration on upper arm.

## 2021-03-22 ENCOUNTER — Emergency Department: Payer: Medicare Other

## 2021-03-22 ENCOUNTER — Inpatient Hospital Stay
Admission: EM | Admit: 2021-03-22 | Discharge: 2021-03-27 | DRG: 854 | Disposition: A | Discharge status: Home Health | Payer: Medicare Other | Attending: Internal Medicine | Admitting: Internal Medicine

## 2021-03-22 ENCOUNTER — Ambulatory Visit
Admission: RE | Admit: 2021-03-22 | Discharge: 2021-03-22 | Disposition: A | Discharge status: Home / Self Care | Payer: Medicare Other | Source: Ambulatory Visit | Attending: Internal Medicine | Admitting: Internal Medicine

## 2021-03-22 ENCOUNTER — Other Ambulatory Visit: Payer: Self-pay

## 2021-03-22 ENCOUNTER — Ambulatory Visit (INDEPENDENT_AMBULATORY_CARE_PROVIDER_SITE_OTHER): Payer: Medicare Other | Admitting: Internal Medicine

## 2021-03-22 ENCOUNTER — Inpatient Hospital Stay: Payer: Medicare Other

## 2021-03-22 ENCOUNTER — Encounter: Payer: Self-pay | Admitting: Radiology

## 2021-03-22 ENCOUNTER — Encounter: Payer: Self-pay | Admitting: Internal Medicine

## 2021-03-22 ENCOUNTER — Ambulatory Visit
Admission: RE | Admit: 2021-03-22 | Discharge: 2021-03-22 | Disposition: A | Discharge status: Home / Self Care | Payer: Medicare Other | Attending: Internal Medicine | Admitting: Internal Medicine

## 2021-03-22 VITALS — BP 146/91 | HR 101 | Temp 97.5°F

## 2021-03-22 DIAGNOSIS — R29810 Facial weakness: Secondary | ICD-10-CM | POA: Diagnosis present

## 2021-03-22 DIAGNOSIS — E876 Hypokalemia: Secondary | ICD-10-CM | POA: Diagnosis present

## 2021-03-22 DIAGNOSIS — E1169 Type 2 diabetes mellitus with other specified complication: Secondary | ICD-10-CM | POA: Diagnosis present

## 2021-03-22 DIAGNOSIS — R471 Dysarthria and anarthria: Secondary | ICD-10-CM | POA: Diagnosis present

## 2021-03-22 DIAGNOSIS — E872 Acidosis, unspecified: Secondary | ICD-10-CM | POA: Diagnosis present

## 2021-03-22 DIAGNOSIS — Z20822 Contact with and (suspected) exposure to covid-19: Secondary | ICD-10-CM | POA: Diagnosis present

## 2021-03-22 DIAGNOSIS — G9389 Other specified disorders of brain: Secondary | ICD-10-CM | POA: Diagnosis present

## 2021-03-22 DIAGNOSIS — I4819 Other persistent atrial fibrillation: Secondary | ICD-10-CM | POA: Diagnosis present

## 2021-03-22 DIAGNOSIS — I69398 Other sequelae of cerebral infarction: Secondary | ICD-10-CM

## 2021-03-22 DIAGNOSIS — F0390 Unspecified dementia without behavioral disturbance: Secondary | ICD-10-CM | POA: Diagnosis present

## 2021-03-22 DIAGNOSIS — S40022A Contusion of left upper arm, initial encounter: Secondary | ICD-10-CM

## 2021-03-22 DIAGNOSIS — Z8616 Personal history of COVID-19: Secondary | ICD-10-CM | POA: Diagnosis not present

## 2021-03-22 DIAGNOSIS — R6520 Severe sepsis without septic shock: Secondary | ICD-10-CM | POA: Diagnosis present

## 2021-03-22 DIAGNOSIS — E785 Hyperlipidemia, unspecified: Secondary | ICD-10-CM | POA: Diagnosis present

## 2021-03-22 DIAGNOSIS — Z8551 Personal history of malignant neoplasm of bladder: Secondary | ICD-10-CM

## 2021-03-22 DIAGNOSIS — Z85118 Personal history of other malignant neoplasm of bronchus and lung: Secondary | ICD-10-CM

## 2021-03-22 DIAGNOSIS — L03114 Cellulitis of left upper limb: Secondary | ICD-10-CM

## 2021-03-22 DIAGNOSIS — E1122 Type 2 diabetes mellitus with diabetic chronic kidney disease: Secondary | ICD-10-CM | POA: Diagnosis present

## 2021-03-22 DIAGNOSIS — Z85820 Personal history of malignant melanoma of skin: Secondary | ICD-10-CM

## 2021-03-22 DIAGNOSIS — A419 Sepsis, unspecified organism: Secondary | ICD-10-CM | POA: Diagnosis present

## 2021-03-22 DIAGNOSIS — Z79899 Other long term (current) drug therapy: Secondary | ICD-10-CM

## 2021-03-22 DIAGNOSIS — M79602 Pain in left arm: Secondary | ICD-10-CM

## 2021-03-22 DIAGNOSIS — H9190 Unspecified hearing loss, unspecified ear: Secondary | ICD-10-CM | POA: Diagnosis present

## 2021-03-22 DIAGNOSIS — E1165 Type 2 diabetes mellitus with hyperglycemia: Secondary | ICD-10-CM | POA: Diagnosis present

## 2021-03-22 DIAGNOSIS — M71122 Other infective bursitis, left elbow: Secondary | ICD-10-CM | POA: Diagnosis present

## 2021-03-22 DIAGNOSIS — K219 Gastro-esophageal reflux disease without esophagitis: Secondary | ICD-10-CM | POA: Diagnosis present

## 2021-03-22 DIAGNOSIS — N1831 Chronic kidney disease, stage 3a: Secondary | ICD-10-CM | POA: Diagnosis present

## 2021-03-22 DIAGNOSIS — S51002A Unspecified open wound of left elbow, initial encounter: Secondary | ICD-10-CM

## 2021-03-22 DIAGNOSIS — E119 Type 2 diabetes mellitus without complications: Secondary | ICD-10-CM

## 2021-03-22 DIAGNOSIS — I152 Hypertension secondary to endocrine disorders: Secondary | ICD-10-CM | POA: Diagnosis present

## 2021-03-22 DIAGNOSIS — G3184 Mild cognitive impairment, so stated: Secondary | ICD-10-CM | POA: Diagnosis present

## 2021-03-22 DIAGNOSIS — Z8673 Personal history of transient ischemic attack (TIA), and cerebral infarction without residual deficits: Secondary | ICD-10-CM

## 2021-03-22 DIAGNOSIS — Z823 Family history of stroke: Secondary | ICD-10-CM

## 2021-03-22 DIAGNOSIS — G8194 Hemiplegia, unspecified affecting left nondominant side: Secondary | ICD-10-CM | POA: Diagnosis present

## 2021-03-22 DIAGNOSIS — E039 Hypothyroidism, unspecified: Secondary | ICD-10-CM | POA: Diagnosis present

## 2021-03-22 DIAGNOSIS — M7989 Other specified soft tissue disorders: Secondary | ICD-10-CM | POA: Diagnosis not present

## 2021-03-22 DIAGNOSIS — Z794 Long term (current) use of insulin: Secondary | ICD-10-CM

## 2021-03-22 DIAGNOSIS — S51012A Laceration without foreign body of left elbow, initial encounter: Secondary | ICD-10-CM

## 2021-03-22 DIAGNOSIS — Z6835 Body mass index (BMI) 35.0-35.9, adult: Secondary | ICD-10-CM | POA: Diagnosis not present

## 2021-03-22 DIAGNOSIS — Z66 Do not resuscitate: Secondary | ICD-10-CM | POA: Diagnosis present

## 2021-03-22 DIAGNOSIS — E1159 Type 2 diabetes mellitus with other circulatory complications: Secondary | ICD-10-CM | POA: Diagnosis present

## 2021-03-22 DIAGNOSIS — Y92009 Unspecified place in unspecified non-institutional (private) residence as the place of occurrence of the external cause: Secondary | ICD-10-CM

## 2021-03-22 DIAGNOSIS — Z809 Family history of malignant neoplasm, unspecified: Secondary | ICD-10-CM

## 2021-03-22 DIAGNOSIS — E669 Obesity, unspecified: Secondary | ICD-10-CM | POA: Diagnosis present

## 2021-03-22 DIAGNOSIS — N183 Chronic kidney disease, stage 3 unspecified: Secondary | ICD-10-CM

## 2021-03-22 DIAGNOSIS — Z8249 Family history of ischemic heart disease and other diseases of the circulatory system: Secondary | ICD-10-CM

## 2021-03-22 DIAGNOSIS — Z888 Allergy status to other drugs, medicaments and biological substances status: Secondary | ICD-10-CM

## 2021-03-22 DIAGNOSIS — W19XXXA Unspecified fall, initial encounter: Secondary | ICD-10-CM

## 2021-03-22 DIAGNOSIS — Z7901 Long term (current) use of anticoagulants: Secondary | ICD-10-CM

## 2021-03-22 LAB — URINALYSIS, COMPLETE (UACMP) WITH MICROSCOPIC
Bacteria, UA: NONE SEEN
Bilirubin Urine: NEGATIVE
Glucose, UA: >=500 mg/dL — AB
Ketones, ur: NEGATIVE mg/dL
Leukocytes,Ua: NEGATIVE
Nitrite: NEGATIVE
Protein, ur: NEGATIVE mg/dL
Specific Gravity, Urine: 1.006 (ref 1.005–1.030)
pH: 5 (ref 5.0–8.0)

## 2021-03-22 LAB — DIFFERENTIAL
Abs Immature Granulocytes: 0.14 10*3/uL — ABNORMAL HIGH (ref 0.00–0.07)
Basophils Absolute: 0.1 10*3/uL (ref 0.0–0.1)
Basophils Relative: 0 %
Eosinophils Absolute: 0 10*3/uL (ref 0.0–0.5)
Eosinophils Relative: 0 %
Immature Granulocytes: 1 %
Lymphocytes Relative: 4 %
Lymphs Abs: 0.6 10*3/uL — ABNORMAL LOW (ref 0.7–4.0)
Monocytes Absolute: 1.3 10*3/uL — ABNORMAL HIGH (ref 0.1–1.0)
Monocytes Relative: 7 %
Neutro Abs: 15.7 10*3/uL — ABNORMAL HIGH (ref 1.7–7.7)
Neutrophils Relative %: 88 %

## 2021-03-22 LAB — COMPREHENSIVE METABOLIC PANEL
ALT: 31 U/L (ref 0–44)
AST: 44 U/L — ABNORMAL HIGH (ref 15–41)
Albumin: 3.4 g/dL — ABNORMAL LOW (ref 3.5–5.0)
Alkaline Phosphatase: 144 U/L — ABNORMAL HIGH (ref 38–126)
Anion gap: 10 (ref 5–15)
BUN: 26 mg/dL — ABNORMAL HIGH (ref 8–23)
CO2: 20 mmol/L — ABNORMAL LOW (ref 22–32)
Calcium: 8.9 mg/dL (ref 8.9–10.3)
Chloride: 99 mmol/L (ref 98–111)
Creatinine, Ser: 1.23 mg/dL — ABNORMAL HIGH (ref 0.44–1.00)
GFR, Estimated: 42 mL/min — ABNORMAL LOW (ref >60)
Glucose, Bld: 411 mg/dL — ABNORMAL HIGH (ref 70–99)
Potassium: 4.2 mmol/L (ref 3.5–5.1)
Sodium: 129 mmol/L — ABNORMAL LOW (ref 135–145)
Total Bilirubin: 1.8 mg/dL — ABNORMAL HIGH (ref 0.3–1.2)
Total Protein: 7.3 g/dL (ref 6.5–8.1)

## 2021-03-22 LAB — CBC
HCT: 41.9 % (ref 36.0–46.0)
Hemoglobin: 14.6 g/dL (ref 12.0–15.0)
MCH: 32.7 pg (ref 26.0–34.0)
MCHC: 34.8 g/dL (ref 30.0–36.0)
MCV: 93.7 fL (ref 80.0–100.0)
Platelets: 281 10*3/uL (ref 150–400)
RBC: 4.47 MIL/uL (ref 3.87–5.11)
RDW: 12.3 % (ref 11.5–15.5)
WBC: 17.8 10*3/uL — ABNORMAL HIGH (ref 4.0–10.5)
nRBC: 0 % (ref 0.0–0.2)

## 2021-03-22 LAB — PROTIME-INR
INR: 1.5 — ABNORMAL HIGH (ref 0.8–1.2)
Prothrombin Time: 18.1 seconds — ABNORMAL HIGH (ref 11.4–15.2)

## 2021-03-22 LAB — APTT: aPTT: 40 seconds — ABNORMAL HIGH (ref 24–36)

## 2021-03-22 LAB — LACTIC ACID, PLASMA
Lactic Acid, Venous: 2.7 mmol/L (ref 0.5–1.9)
Lactic Acid, Venous: 2.7 mmol/L (ref 0.5–1.9)

## 2021-03-22 MED ORDER — APIXABAN 5 MG PO TABS
5.0000 mg | ORAL_TABLET | Freq: Two times a day (BID) | ORAL | Status: DC
  Administered 2021-03-22 – 2021-03-23 (×2): 5 mg via ORAL
  Filled 2021-03-22 (×2): qty 1

## 2021-03-22 MED ORDER — VANCOMYCIN HCL IN DEXTROSE 1-5 GM/200ML-% IV SOLN: 1000.0000 mg | Freq: Once | INTRAVENOUS | Status: DC

## 2021-03-22 MED ORDER — SODIUM CHLORIDE 0.9 % IV SOLN: 2.0000 g | INTRAVENOUS | Status: DC

## 2021-03-22 MED ORDER — METRONIDAZOLE 500 MG/100ML IV SOLN
500.0000 mg | Freq: Once | INTRAVENOUS | Status: AC
  Administered 2021-03-22: 500 mg via INTRAVENOUS
  Filled 2021-03-22: qty 100

## 2021-03-22 MED ORDER — SENNOSIDES-DOCUSATE SODIUM 8.6-50 MG PO TABS: 1.0000 | ORAL_TABLET | Freq: Every evening | ORAL | Status: DC | PRN

## 2021-03-22 MED ORDER — ACETAMINOPHEN 500 MG PO TABS
1000.0000 mg | ORAL_TABLET | Freq: Once | ORAL | Status: AC
  Administered 2021-03-22: 1000 mg via ORAL
  Filled 2021-03-22: qty 2

## 2021-03-22 MED ORDER — LACTATED RINGERS IV BOLUS
1000.0000 mL | Freq: Once | INTRAVENOUS | Status: AC
  Administered 2021-03-22: 1000 mL via INTRAVENOUS

## 2021-03-22 MED ORDER — SODIUM CHLORIDE 0.9 % IV SOLN
2.0000 g | Freq: Once | INTRAVENOUS | Status: AC
  Administered 2021-03-22: 2 g via INTRAVENOUS
  Filled 2021-03-22: qty 2

## 2021-03-22 MED ORDER — METOPROLOL SUCCINATE ER 25 MG PO TB24
25.0000 mg | ORAL_TABLET | Freq: Every day | ORAL | Status: DC
  Administered 2021-03-23 – 2021-03-27 (×4): 25 mg via ORAL
  Filled 2021-03-22 (×6): qty 1

## 2021-03-22 MED ORDER — SODIUM CHLORIDE 0.9% FLUSH
3.0000 mL | Freq: Two times a day (BID) | INTRAVENOUS | Status: DC
  Administered 2021-03-22 – 2021-03-27 (×8): 3 mL via INTRAVENOUS

## 2021-03-22 MED ORDER — SODIUM CHLORIDE 0.9 % IV SOLN
2.0000 g | INTRAVENOUS | Status: DC
  Administered 2021-03-23 – 2021-03-25 (×3): 2 g via INTRAVENOUS
  Filled 2021-03-22: qty 2
  Filled 2021-03-22: qty 20
  Filled 2021-03-22: qty 2

## 2021-03-22 MED ORDER — LACTATED RINGERS IV SOLN: INTRAVENOUS | Status: AC

## 2021-03-22 MED ORDER — ONDANSETRON HCL 4 MG/2ML IJ SOLN: 4.0000 mg | Freq: Four times a day (QID) | INTRAMUSCULAR | Status: DC | PRN

## 2021-03-22 MED ORDER — INSULIN ASPART 100 UNIT/ML IJ SOLN
8.0000 [IU] | Freq: Once | INTRAMUSCULAR | Status: AC
  Administered 2021-03-22: 8 [IU] via SUBCUTANEOUS
  Filled 2021-03-22: qty 1

## 2021-03-22 MED ORDER — LEVOTHYROXINE SODIUM 88 MCG PO TABS
88.0000 ug | ORAL_TABLET | Freq: Every day | ORAL | Status: DC
  Administered 2021-03-24 – 2021-03-27 (×4): 88 ug via ORAL
  Filled 2021-03-22 (×4): qty 1

## 2021-03-22 MED ORDER — ACETAMINOPHEN 650 MG RE SUPP: 650.0000 mg | Freq: Four times a day (QID) | RECTAL | Status: DC | PRN

## 2021-03-22 MED ORDER — ONDANSETRON HCL 4 MG PO TABS: 4.0000 mg | ORAL_TABLET | Freq: Four times a day (QID) | ORAL | Status: DC | PRN

## 2021-03-22 MED ORDER — BUSPIRONE HCL 5 MG PO TABS
5.0000 mg | ORAL_TABLET | Freq: Two times a day (BID) | ORAL | Status: DC
  Administered 2021-03-22 – 2021-03-27 (×9): 5 mg via ORAL
  Filled 2021-03-22 (×11): qty 1

## 2021-03-22 MED ORDER — SODIUM CHLORIDE 0.9% FLUSH
3.0000 mL | Freq: Once | INTRAVENOUS | Status: AC
  Administered 2021-03-22: 3 mL via INTRAVENOUS

## 2021-03-22 MED ORDER — ACETAMINOPHEN 325 MG PO TABS: 650.0000 mg | ORAL_TABLET | Freq: Four times a day (QID) | ORAL | Status: DC | PRN

## 2021-03-22 MED ORDER — VANCOMYCIN HCL 500 MG/100ML IV SOLN
500.0000 mg | INTRAVENOUS | Status: DC
  Filled 2021-03-22: qty 100

## 2021-03-22 MED ORDER — MEMANTINE HCL 10 MG PO TABS
10.0000 mg | ORAL_TABLET | Freq: Two times a day (BID) | ORAL | Status: DC
  Administered 2021-03-22 – 2021-03-27 (×9): 10 mg via ORAL
  Filled 2021-03-22: qty 1
  Filled 2021-03-22: qty 2
  Filled 2021-03-22 (×9): qty 1

## 2021-03-22 MED ORDER — VANCOMYCIN HCL 2000 MG/400ML IV SOLN
2000.0000 mg | Freq: Once | INTRAVENOUS | Status: AC
  Administered 2021-03-22: 2000 mg via INTRAVENOUS
  Filled 2021-03-22: qty 400

## 2021-03-22 MED ORDER — ATORVASTATIN CALCIUM 10 MG PO TABS
10.0000 mg | ORAL_TABLET | Freq: Every day | ORAL | Status: DC
  Administered 2021-03-23 – 2021-03-27 (×4): 10 mg via ORAL
  Filled 2021-03-22 (×4): qty 1

## 2021-03-22 MED ORDER — IOHEXOL 300 MG/ML  SOLN: 75.0000 mL | Freq: Once | INTRAMUSCULAR | Status: DC | PRN

## 2021-03-22 MED ORDER — INSULIN GLARGINE 100 UNIT/ML ~~LOC~~ SOLN
10.0000 [IU] | Freq: Every day | SUBCUTANEOUS | Status: DC
  Administered 2021-03-22 – 2021-03-23 (×2): 10 [IU] via SUBCUTANEOUS
  Filled 2021-03-22 (×3): qty 0.1

## 2021-03-22 NOTE — Sepsis Progress Note (Signed)
Sepsis protocol followed by [facility] 

## 2021-03-22 NOTE — ED Notes (Signed)
cbg [id number]

## 2021-03-22 NOTE — Progress Notes (Signed)
Subjective:    Patient ID: Michelle Vang, female    DOB: 07/29/1929, 87 y.o.   MRN: [id number]  HPI  Patient presents the clinic today with complaint of left arm pain, swelling and bruising.  She reports this started 3 days ago after a fall.  Her daughter reports she was walking up the steps into the back of her house when she fell backwards.  Her daughter is unsure of what she may have hit but probably landed on a concrete pad.  She is currently unable to describe the pain.  Her daughter does not think she hit her head or lost consciousness.  They have not applied ice or tried anything OTC for this.  Of note, her daughter reports her blood sugars have been elevated up to 350.  She is currently taking 7 units of Lantus at night.  Her last A1c was 9.4%, 01/2021.  Review of Systems     Past Medical History:  Diagnosis Date   Bladder cancer ([place])    Diabetes mellitus without complication ([facility])    GERD (gastroesophageal reflux disease)    Hypertension    Lung cancer ([facility])    Thyroid disease     Current Outpatient Medications  Medication Sig Dispense Refill   apixaban (ELIQUIS) 2.5 MG TABS tablet Take 2 tablets (5 mg total) by mouth 2 (two) times daily. 60 tablet 0   atorvastatin (LIPITOR) 10 MG tablet TAKE 1 TABLET BY MOUTH ONCE A DAY (Patient taking differently: Take 10 mg by mouth daily.) 90 tablet 1   Blood Glucose Monitoring Suppl (TRUE FOCUS BLOOD GLUCOSE METER) DEVI 1 Device by Does not apply route daily. 1 each 1   busPIRone (BUSPAR) 5 MG tablet Take 1 tablet (5 mg total) by mouth 2 (two) times daily. 60 tablet 1   Cholecalciferol (VITAMIN D3) 1000 UNITS CAPS Take 1 capsule by mouth daily. Reported on 11/15/2015     feeding supplement (ENSURE ENLIVE / ENSURE PLUS) LIQD Take 237 mLs by mouth 3 (three) times daily between meals. 237 mL 12   fluocinonide (LIDEX) 0.05 % external solution Apply 1 application topically as needed.     glucose blood (TRUE FOCUS BLOOD GLUCOSE STRIP) test  strip Use as instructed 100 each 12   insulin glargine (LANTUS) 100 UNIT/ML injection Inject 0.07 mLs (7 Units total) into the skin daily. 10 mL 11   Lancets (ONETOUCH ULTRASOFT) lancets Use as instructed 100 each 12   levothyroxine (SYNTHROID) 88 MCG tablet TAKE 1 TAB BY MOUTH ONCE DAILY. TAKE ON AN EMPTY STOMACH WITH A GLASS OF WATER ATLEAST 30-60 MINUTES BEFORE BREAKFAST (Patient taking differently: Take 88 mcg by mouth daily before breakfast.) 90 tablet 0   memantine (NAMENDA) 10 MG tablet TAKE 1 TABLET BY MOUTH TWICE A DAY 180 tablet 0   metoprolol succinate (TOPROL-XL) 25 MG 24 hr tablet TAKE 1 TABLET BY MOUTH ONCE A DAY 30 tablet 0   Omega-3 Fatty Acids (FISH OIL) 1000 MG CAPS Take 1 capsule by mouth daily.     No current facility-administered medications for this visit.    Allergies  Allergen Reactions   Aricept [Donepezil Hcl] Diarrhea    Family History  Problem Relation Age of Onset   Stroke Mother    Heart disease Father    Cancer Sister        Pancreatic    Social History   Socioeconomic History   Marital status: Widowed    Spouse name: Not on file  Number of children: 2   Years of education: Not on file   Highest education level: Not on file  Occupational History   Occupation: Part time Scientist, water quality  Tobacco Use   Smoking status: Never   Smokeless tobacco: Never  Vaping Use   Vaping Use: Never used  Substance and Sexual Activity   Alcohol use: No    Alcohol/week: 0.0 standard drinks   Drug use: No   Sexual activity: Not Currently  Other Topics Concern   Not on file  Social History Narrative   Right handed      Completed 12th grade      Lives alone has care givers    Social Determinants of Health   Financial Resource Strain: Not on file  Food Insecurity: Not on file  Transportation Needs: Not on file  Physical Activity: Not on file  Stress: Not on file  Social Connections: Not on file  Intimate Partner Violence: Not on file     Constitutional:  Denies fever, malaise, fatigue, headache or abrupt weight changes.  Respiratory: Denies difficulty breathing, shortness of breath.   Cardiovascular: Denies chest pain, chest tightness, palpitations.  Gastrointestinal: Denies abdominal pain, bloating, constipation, diarrhea or blood in the stool.  GU: Denies urgency, frequency, pain with urination, burning sensation, blood in urine, odor or discharge. Musculoskeletal: Patient reports left arm pain and swelling.  Denies decrease in range of motion, difficulty with gait, muscle pain.  Skin: Patient reports open wound to left elbow.  Bruising of left arm.  Denies redness, rashes, lesions or ulcercations.  Neurological: Patient has difficulty with memory, difficulty with balance.  Denies dizziness, difficulty with speech or problems with coordination.    No other specific complaints in a complete review of systems (except as listed in HPI above).  Objective:   Physical Exam BP (!) 146/91 (BP Location: Right Arm, Patient Position: Sitting, Cuff Size: Large)   Pulse (!) 101   Temp (!) 97.5 F (36.4 C) (Temporal)   Wt Readings from Last 3 Encounters:  03/19/21 198 lb (89.8 kg)  02/20/21 198 lb 12.8 oz (90.2 kg)  02/07/21 199 lb 15.3 oz (90.7 kg)    General: Appears her stated age, obese, in NAD. Skin: 1 cm open skin tear over left elbow.  Bruising of the distal left bicep/tricep and proximal radius/ulna. HEENT: Head: normal shape and size; Eyes: EOMs intact;  Cardiovascular: Tachycardic with irregular rhythm. Pulmonary/Chest: Normal effort and positive vesicular breath sounds. No respiratory distress. No wheezes, rales or ronchi noted.  Musculoskeletal: Normal flexion, extension and rotation of the right elbow.  1+ swelling noted of the left elbow. Neurological: Alert.  Very HOH.   BMET    Component Value Date/Time   NA 140 02/09/2021 0538   NA 140 03/22/2013 0800   K 3.8 02/09/2021 0538   K 4.1 03/22/2013 0800   CL 111 02/09/2021  0538   CL 106 03/22/2013 0800   CO2 23 02/09/2021 0538   CO2 27 03/22/2013 0800   GLUCOSE 149 (H) 02/09/2021 0538   GLUCOSE 104 (H) 03/22/2013 0800   BUN 29 (H) 02/09/2021 0538   BUN 15 03/22/2013 0800   CREATININE 1.10 (H) 02/09/2021 0538   CREATININE 1.25 (H) 02/05/2021 1346   CALCIUM 8.6 (L) 02/09/2021 0538   CALCIUM 9.2 03/22/2013 0800   GFRNONAA 48 (L) 02/09/2021 0538   GFRNONAA 59 (L) 03/22/2013 0800   GFRAA >60 03/22/2013 0800    Lipid Panel     Component Value Date/Time  CHOL 126 02/08/2021 0504   TRIG 103 02/08/2021 0504   HDL 35 (L) 02/08/2021 0504   CHOLHDL 3.6 02/08/2021 0504   VLDL 21 02/08/2021 0504   LDLCALC 70 02/08/2021 0504    CBC    Component Value Date/Time   WBC 9.1 02/12/2021 0444   RBC 4.21 02/12/2021 0444   HGB 13.6 02/12/2021 0444   HGB 13.0 09/22/2012 0934   HCT 39.2 02/12/2021 0444   HCT 39.4 09/22/2012 0934   PLT 261 02/12/2021 0444   PLT 253 09/22/2012 0934   MCV 93.1 02/12/2021 0444   MCV 91 09/22/2012 0934   MCH 32.3 02/12/2021 0444   MCHC 34.7 02/12/2021 0444   RDW 12.2 02/12/2021 0444   RDW 13.0 09/22/2012 0934   LYMPHSABS 2.0 02/09/2021 0538   LYMPHSABS 1.7 09/22/2012 0934   MONOABS 1.1 (H) 02/09/2021 0538   MONOABS 0.7 09/22/2012 0934   EOSABS 0.2 02/09/2021 0538   EOSABS 0.2 09/22/2012 0934   BASOSABS 0.1 02/09/2021 0538   BASOSABS 0.1 09/22/2012 0934    Hgb A1C Lab Results  Component Value Date   HGBA1C 9.4 (A) 02/05/2021            Assessment & Plan:   Left Arm Pain, Swelling and Bruising s/p Fall:  Will obtain x-ray of left elbow Will obtain x-ray of left humerus Encouraged ice for 10 minutes 2 times daily Encouraged elevation Can take Tylenol arthritis 650 mg 3 times daily as needed for pain  We will follow-up after x-ray, return precautions discussed  Webb Silversmith, NP This visit occurred during the SARS-CoV-2 public health emergency.  Safety protocols were in place, including screening questions  prior to the visit, additional usage of staff PPE, and extensive cleaning of exam room while observing appropriate contact time as indicated for disinfecting solutions.

## 2021-03-22 NOTE — ED Triage Notes (Signed)
Arrives with left sided weakness and unable to able to ambulate.  Daughter states symptoms started this mornign at around 35 / 1100.  Patient is AAOx3.  MAE equally weakly.  Speech clear.  NAD.  Leaning to left in wheelchair.

## 2021-03-22 NOTE — ED Notes (Signed)
This tech answered call bell, and collected urine of pt and sent to lab. Pt daughter asked about pt getting some ice cream or a snack. Let pt know I will contact nurse to make sure it is ok.

## 2021-03-22 NOTE — H&P (Addendum)
History and Physical    Michelle Vang ZTI:[id number] DOB: 07-Jun-1929 DOA: 03/22/2021  PCP: Jearld Fenton, NP  Patient coming from: Home  I have personally briefly reviewed patient's old medical records in [place]  Chief Complaint: Left-sided weakness  HPI: Michelle Vang is a 87 y.o. female with medical history significant for recent right MCA territory stroke, persistent atrial fibrillation on Eliquis, CKD stage III, insulin-dependent type 2 diabetes, HTN, HLD, dysphagia, history of lung and bladder cancer, hypothyroidism, hard of hearing, and mild dementia who presented to the ED for evaluation of left-sided weakness.  History is supplemented by daughter at bedside.  Patient was recently admitted 02/07/2021-02/14/2021 for left-sided weakness due to acute right MCA territory stroke.  She was started on Eliquis due to recent diagnosis of atrial fibrillation.  She was ultimately discharged to home with home health services.  Daughter states that patient lives at home and has 24-hour care from family members.  She normally ambulates with the use of a Rollator.  She developed a recent wound to her left elbow after an unspecified injury.  She was taken to her PCP for evaluation.  Afterwards she was noted to have significant lethargy and left-sided weakness which prompted family to bring her back to the ED due to concern for recurrent stroke. Patient otherwise states that she feels fine and has no pain or specific complaints.  ED Course:  Initial vitals showed BP 146/90, pulse 123, RR 16, temp 102.1 F, SPO2 95% on room air.  Labs show WBC 17.8, hemoglobin 14.6, platelets 281,000, serum glucose 411, sodium 129 (136 when corrected for hyperglycemia), potassium 4.2, bicarb 20, BUN 26, creatinine 1.23, AST 44, ALT 31, alk phos 144, total bilirubin 1.8, lactic acid 2.7.  Blood cultures obtained and pending.  Urinalysis and urine culture ordered and pending collection.  CT head showed  evolution of previously seen infarct in the right MCA territory with developing encephalomalacia in the right insula and basal ganglia.  Portable chest x-ray negative for focal consolidation, edema, or effusion.  Patient seen by neurology on arrival and felt symptoms were likely recrudescence of prior stroke in the setting of sepsis.  Patient was given 2 L LR, IV vancomycin, cefepime, Flagyl.  The hospitalist service was consulted to admit for further evaluation and management.  Review of Systems: All systems reviewed and are negative except as documented in history of present illness above.   Past Medical History:  Diagnosis Date   Bladder cancer ([place])    Diabetes mellitus without complication ([place])    GERD (gastroesophageal reflux disease)    Hypertension    Lung cancer ([place])    Thyroid disease     Past Surgical History:  Procedure Laterality Date   CYSTOSCOPY     LUNG CANCER SURGERY     Removed 1 lobe on Right lung   SKIN CANCER EXCISION  2014   Melanoma    Social History:  reports that she has never smoked. She has never used smokeless tobacco. She reports that she does not drink alcohol and does not use drugs.  Allergies  Allergen Reactions   Aricept [Donepezil Hcl] Diarrhea    Family History  Problem Relation Age of Onset   Stroke Mother    Heart disease Father    Cancer Sister        Pancreatic     Prior to Admission medications   Medication Sig Start Date End Date Taking? Authorizing Provider  apixaban (ELIQUIS) 2.5 MG  TABS tablet Take 2 tablets (5 mg total) by mouth 2 (two) times daily. 02/14/21   Krishnan, Sendil K, MD  atorvastatin (LIPITOR) 10 MG tablet TAKE 1 TABLET BY MOUTH ONCE A DAY Patient taking differently: Take 10 mg by mouth daily. 11/23/20   Baity, Regina W, NP  Blood Glucose Monitoring Suppl (TRUE FOCUS BLOOD GLUCOSE METER) DEVI 1 Device by Does not apply route daily. 02/21/21   Baity, Regina W, NP  busPIRone (BUSPAR) 5 MG tablet Take 1 tablet (5  mg total) by mouth 2 (two) times daily. 02/14/21   Krishnan, Sendil K, MD  Cholecalciferol (VITAMIN D3) 1000 UNITS CAPS Take 1 capsule by mouth daily. Reported on 11/15/2015    [provider]  feeding supplement (ENSURE ENLIVE / ENSURE PLUS) LIQD Take 237 mLs by mouth 3 (three) times daily between meals. 02/14/21   Krishnan, Sendil K, MD  fluocinonide (LIDEX) 0.05 % external solution Apply 1 application topically as needed. 09/12/20   [provider]  glucose blood (TRUE FOCUS BLOOD GLUCOSE STRIP) test strip Use as instructed 02/21/21   Baity, Regina W, NP  insulin glargine (LANTUS) 100 UNIT/ML injection Inject 0.07 mLs (7 Units total) into the skin daily. 02/15/21   Krishnan, Sendil K, MD  Lancets (ONETOUCH ULTRASOFT) lancets Use as instructed 02/21/21   Baity, Regina W, NP  levothyroxine (SYNTHROID) 88 MCG tablet TAKE 1 TAB BY MOUTH ONCE DAILY. TAKE ON AN EMPTY STOMACH WITH A GLASS OF WATER ATLEAST 30-60 MINUTES BEFORE BREAKFAST Patient taking differently: Take 88 mcg by mouth daily before breakfast. 02/05/21   Baity, Regina W, NP  memantine (NAMENDA) 10 MG tablet TAKE 1 TABLET BY MOUTH TWICE A DAY 02/25/21   Aquino, Karen M, MD  metoprolol succinate (TOPROL-XL) 25 MG 24 hr tablet TAKE 1 TABLET BY MOUTH ONCE A DAY 03/02/21   Baity, Regina W, NP  Omega-3 Fatty Acids (FISH OIL) 1000 MG CAPS Take 1 capsule by mouth daily.    [provider]    Physical Exam: Vitals:   03/22/21 1730 03/22/21 1800 03/22/21 1830 03/22/21 1845  BP: (!) 147/78 (!) 165/76 (!) 156/83   Pulse:  100 (!) 103 (!) 102  Resp: 18 (!) 21 19 17  Temp:      TempSrc:      SpO2:  96% 95% 93%   Constitutional: Elderly woman resting in bed with head elevated, NAD, calm, comfortable Eyes: PERRL, lids and conjunctivae normal ENMT: Very hard of hearing, mucous membranes are moist. Posterior pharynx clear of any exudate or lesions.Normal dentition.  Neck: normal, supple, no masses. Respiratory: clear to auscultation  bilaterally, no wheezing, no crackles. Normal respiratory effort. No accessory muscle use.  Cardiovascular: Irregularly irregular, no murmurs / rubs / gallops. No extremity edema. 2+ pedal pulses. Abdomen: no tenderness, no masses palpated. No hepatosplenomegaly. Bowel sounds positive.  Musculoskeletal: no clubbing / cyanosis. No joint deformity upper and lower extremities. Good ROM, no contractures. Normal muscle tone.  Skin: Circular open wound at left elbow pictured below with slight surrounding erythema, some purulent drainage. Neurologic: CN 2-12 grossly intact. Sensation intact. Strength 5/5 in all 4.  Psychiatric: Awake, alert, oriented to self, place, time, but not situation.    Labs on Admission: I have personally reviewed following labs and imaging studies  CBC: Recent Labs  Lab 03/22/21 1600  WBC 17.8*  NEUTROABS 15.7*  HGB 14.6  HCT 41.9  MCV 93.7  PLT 281   Basic Metabolic Panel: Recent Labs  Lab 03/22/21   1600  NA 129*  K 4.2  CL 99  CO2 20*  GLUCOSE 411*  BUN 26*  CREATININE 1.23*  CALCIUM 8.9   GFR: Estimated Creatinine Clearance: 32.3 mL/min (A) (by C-G formula based on SCr of 1.23 mg/dL (H)). Liver Function Tests: Recent Labs  Lab 03/22/21 1600  AST 44*  ALT 31  ALKPHOS 144*  BILITOT 1.8*  PROT 7.3  ALBUMIN 3.4*   No results for input(s): LIPASE, AMYLASE in the last 168 hours. No results for input(s): AMMONIA in the last 168 hours. Coagulation Profile: Recent Labs  Lab 03/22/21 1600  INR 1.5*   Cardiac Enzymes: No results for input(s): CKTOTAL, CKMB, CKMBINDEX, TROPONINI in the last 168 hours. BNP (last 3 results) No results for input(s): PROBNP in the last 8760 hours. HbA1C: No results for input(s): HGBA1C in the last 72 hours. CBG: No results for input(s): GLUCAP in the last 168 hours. Lipid Profile: No results for input(s): CHOL, HDL, LDLCALC, TRIG, CHOLHDL, LDLDIRECT in the last 72 hours. Thyroid Function Tests: No results for  input(s): TSH, T4TOTAL, FREET4, T3FREE, THYROIDAB in the last 72 hours. Anemia Panel: No results for input(s): VITAMINB12, FOLATE, FERRITIN, TIBC, IRON, RETICCTPCT in the last 72 hours. Urine analysis:    Component Value Date/Time   COLORURINE AMBER (A) 02/07/2021 1727   APPEARANCEUR HAZY (A) 02/07/2021 1727   LABSPEC 1.019 02/07/2021 1727   PHURINE 5.0 02/07/2021 1727   GLUCOSEU 50 (A) 02/07/2021 1727   HGBUR NEGATIVE 02/07/2021 1727   BILIRUBINUR NEGATIVE 02/07/2021 1727   BILIRUBINUR Negative 01/23/2020 [place] 02/07/2021 1727   PROTEINUR NEGATIVE 02/07/2021 1727   UROBILINOGEN 0.2 01/23/2020 1423   NITRITE NEGATIVE 02/07/2021 1727   [place] 02/07/2021 1727    Radiological Exams on Admission: DG Chest Portable 1 View  Result Date: 03/22/2021 CLINICAL DATA:  Sepsis EXAM: PORTABLE CHEST 1 VIEW COMPARISON:  None. FINDINGS: The heart size and mediastinal contours are unchanged. Aortic calcification. Biapical pleural/pulmonary scarring. No focal consolidation. No pulmonary edema. No pleural effusion. No pneumothorax. No acute osseous abnormality. IMPRESSION: No active disease. Electronically Signed   By: Iven Finn M.D.   On: 03/22/2021 17:18   CT HEAD CODE STROKE WO CONTRAST  Result Date: 03/22/2021 CLINICAL DATA:  Code stroke. EXAM: CT HEAD WITHOUT CONTRAST TECHNIQUE: Contiguous axial images were obtained from the base of the skull through the vertex without intravenous contrast. COMPARISON:  MRI Feb 08, 2021.  CT head Feb 07, 2021. FINDINGS: Motion limited exam.  Within this limitation: Brain: Evolution of the previously seen infarct in the right MCA territory with developing encephalomalacia in the right insula and basal ganglia.No definite superimposed acute/interval large vascular territory infarct. No acute hemorrhage. Similar generalized atrophy with ex vacuo ventricular dilation. The no hydrocephalus. No mass lesion or abnormal mass effect.  Vascular: No hyperdense vessel identified. Calcific intracranial atherosclerosis. Skull: No acute fracture Sinuses/Orbits: Clear visualized sinuses. No acute orbital findings. Other: No mastoid effusions. ASPECTS [facility] Stroke Program Early CT Score) Total score (0-10 with 10 being normal): 10. Areas of right MCA hypoattenuation appear to correlate with prior infarcts seen on May 27 MRI. IMPRESSION: Evolution of the previously seen infarct in the right MCA territory with developing encephalomalacia in the right insula and basal ganglia. No definite superimposed acute/interval large vascular territory infarct or acute hemorrhage on this motion limited exam. An MRI could provide more sensitive evaluation for peri-infarct ischemia if clinically indicated. Code stroke imaging results were communicated on 03/22/2021 at  4:26 pm to provider Dr. Stack via telephone, who verbally acknowledged these results. Electronically Signed   By: Frederick S Jones MD   On: 03/22/2021 16:29    EKG: Personally reviewed. Atrial fibrillation,110, LAFB.  Rate is faster when compared to prior.  Assessment/Plan Principal Problem:   Severe sepsis with lactic acidosis (HCC) Active Problems:   Hypertension associated with diabetes (HCC)   Hypothyroidism   Insulin dependent type 2 diabetes mellitus (HCC)   Mild cognitive impairment with memory loss   Persistent atrial fibrillation (HCC)   History of CVA (cerebrovascular accident)   CKD (chronic kidney disease), stage III (HCC)   Elbow wound, left, initial encounter   Na T Dick is a 87 y.o. female with medical history significant for recent right MCA territory stroke, persistent atrial fibrillation on Eliquis, CKD stage III, insulin-dependent type 2 diabetes, HTN, HLD, dysphagia, history of lung and bladder cancer, hypothyroidism, and mild dementia who is admitted with severe sepsis.  Severe sepsis suspected due to left elbow wound: Patient presenting with fever,  tachycardia, leukocytosis, and lactic acidosis.  Left elbow wound is presumed source of infection.  CXR without evidence of pneumonia.  Urinalysis pending. -Continue IV vancomycin and ceftriaxone -Follow blood cultures, urinalysis/urine culture -Continue IV fluid hydration overnight -Obtain left elbow x-ray -Consult to wound care  Recent right MCA stroke with recurrent left-sided weakness: Initially presented as a code stroke due to recurrent left-sided weakness.  Seen by neurology who felt CT head and exam consistent with prior stroke, no further neurologic work-up needed at this time.  Suspect recrudescence of prior stroke symptoms in setting of sepsis.  Weakness appears to have resolved with 5/5 strength throughout the extremities at time of my exam. -Continue Eliquis and statin  Hyperglycemia in setting of insulin-dependent type 2 diabetes: Worsened in setting of infection.  No sign of DKA/HHS.  Give 8 units NovoLog once now.  Resume Lantus 10 units and place on SSI.  Persistent atrial fibrillation: Remains in atrial fibrillation with controlled rates.  Continue Eliquis and Toprol-XL.  Hypertension: Continue Toprol-XL.  CKD stage III: Stable, continue monitor.  Hypothyroidism: Continue Synthroid.  Mild dementia: Appears to be at baseline.  Continue Namenda and BuSpar.   DVT prophylaxis: Eliquis Code Status: DNR, confirmed on admission with patient's daughter Family Communication: Discussed with patient's daughter at bedside Disposition Plan: From home, dispo pending clinical progress Consults called: Neurology Level of care: Progressive Cardiac Admission status:  Status is: Inpatient  Remains inpatient appropriate because:Inpatient level of care appropriate due to severity of illness  Dispo: The patient is from: Home              Anticipated d/c is to:  Home versus SNF              Patient currently is not medically stable to d/c.    MD [facility]  If 7PM-7AM, please contact night-coverage [email or web address]  03/22/2021, 8:16 PM    

## 2021-03-22 NOTE — Consult Note (Signed)
PHARMACY -  BRIEF ANTIBIOTIC NOTE   Pharmacy has received consult(s) for Vancomycin & cefepime from an ED provider.  The patient's profile has been reviewed for ht/wt/allergies/indication/available labs.    One time order(s) placed for: Vancomycin 2g x1 (~22mg /kg) Cefepime 2g x1  Further antibiotics/pharmacy consults should be ordered by admitting physician if indicated.                       Thank you, Lorna Dibble 03/22/2021  4:53 PM

## 2021-03-22 NOTE — Consult Note (Signed)
CODE SEPSIS - PHARMACY COMMUNICATION  **Broad Spectrum Antibiotics should be administered within 1 hour of Sepsis diagnosis**  Time Code Sepsis Called/Page Received: 1645  Antibiotics Ordered: 1645  Time of 1st antibiotic administration: 1709  Additional action taken by pharmacy: N/A  If necessary, Name of Provider/Nurse Contacted: N/A  Lorna Dibble ,PharmD Clinical Pharmacist  03/22/2021  4:51 PM

## 2021-03-22 NOTE — Progress Notes (Signed)
CODE STROKE- PHARMACY COMMUNICATION  Time CODE STROKE called/page received:1607  Time response to CODE STROKE was made (in person): 1615  Time Stroke Kit retrieved from [facility] (only if needed): N/A Pt on Eliquis PTA and outside of tPA window w/ LKW 1100 Pt is febrile and altered. Code stroke cancelled; Code sepsis called.  Name of Provider/Nurse contacted: Dr. Quinn Axe  Past Medical History:  Diagnosis Date   Bladder cancer [facility])    Diabetes mellitus without complication ([place])    GERD (gastroesophageal reflux disease)    Hypertension    Lung cancer ([place])    Thyroid disease    Prior to Admission medications   Medication Sig Start Date End Date Taking? Authorizing Provider  apixaban (ELIQUIS) 2.5 MG TABS tablet Take 2 tablets (5 mg total) by mouth 2 (two) times daily. 02/14/21   Annita Brod, MD  atorvastatin (LIPITOR) 10 MG tablet TAKE 1 TABLET BY MOUTH ONCE A DAY Patient taking differently: Take 10 mg by mouth daily. 11/23/20   Jearld Fenton, NP  Blood Glucose Monitoring Suppl (TRUE FOCUS BLOOD GLUCOSE METER) DEVI 1 Device by Does not apply route daily. 02/21/21   Jearld Fenton, NP  busPIRone (BUSPAR) 5 MG tablet Take 1 tablet (5 mg total) by mouth 2 (two) times daily. 02/14/21   Annita Brod, MD  Cholecalciferol (VITAMIN D3) 1000 UNITS CAPS Take 1 capsule by mouth daily. Reported on 11/15/2015    [provider]  feeding supplement (ENSURE ENLIVE / ENSURE PLUS) LIQD Take 237 mLs by mouth 3 (three) times daily between meals. 02/14/21   Annita Brod, MD  fluocinonide (LIDEX) 0.05 % external solution Apply 1 application topically as needed. 09/12/20   [provider]  glucose blood (TRUE FOCUS BLOOD GLUCOSE STRIP) test strip Use as instructed 02/21/21   Jearld Fenton, NP  insulin glargine (LANTUS) 100 UNIT/ML injection Inject 0.07 mLs (7 Units total) into the skin daily. 02/15/21   Annita Brod, MD  Lancets [facility] ULTRASOFT) lancets Use as instructed  02/21/21   Jearld Fenton, NP  levothyroxine (SYNTHROID) 88 MCG tablet TAKE 1 TAB BY MOUTH ONCE DAILY. TAKE ON AN EMPTY STOMACH WITH A GLASS OF WATER ATLEAST 30-60 MINUTES BEFORE BREAKFAST Patient taking differently: Take 88 mcg by mouth daily before breakfast. 02/05/21   Jearld Fenton, NP  memantine (NAMENDA) 10 MG tablet TAKE 1 TABLET BY MOUTH TWICE A DAY 02/25/21   Cameron Sprang, MD  metoprolol succinate (TOPROL-XL) 25 MG 24 hr tablet TAKE 1 TABLET BY MOUTH ONCE A DAY 03/02/21   Jearld Fenton, NP  Omega-3 Fatty Acids (FISH OIL) 1000 MG CAPS Take 1 capsule by mouth daily.    [provider]    Lorna Dibble ,PharmD Clinical Pharmacist  03/22/2021  4:55 PM

## 2021-03-22 NOTE — ED Notes (Signed)
Messaged pharmacy about vancomycin

## 2021-03-22 NOTE — ED Provider Notes (Signed)
[facility] Emergency Department Provider Note   ____________________________________________   Event Date/Time   First MD Initiated Contact with Patient 03/22/21 1605     (approximate)  I have reviewed the triage vital signs and the nursing notes.   HISTORY  Chief Complaint Code Stroke    HPI Michelle Vang is a 87 y.o. female with past medical history of hypertension, hyperlipidemia, diabetes, stroke, and atrial fibrillation on Eliquis who presents to the ED for weakness.  Daughter states that the patient seemed to be doing well when she woke up this morning, was able to dress herself on her own.  Then around 11:00 this morning she seemed to get much weaker than usual with difficulty lifting her left arm and left leg.  She was initially brought to her PCPs office earlier this morning due to a fall 1 week ago with pain and swelling at her left elbow.  She was scheduled for x-ray but daughter noticed the weakness shortly after arriving at the hospital for the x-ray.  She instead brought her directly to the ED and code stroke was activated on arrival.  Daughter denies patient having any recent fevers, cough, chest pain, shortness of breath, vomiting, diarrhea, or difficulty urinating.  Daughter reports patient has a history of dementia and she has difficulty providing history.        Past Medical History:  Diagnosis Date   Bladder cancer ([place])    Diabetes mellitus without complication ([place])    GERD (gastroesophageal reflux disease)    Hypertension    Lung cancer ([place])    Thyroid disease     Patient Active Problem List   Diagnosis Date Noted   Class 1 obesity due to excess calories with serious comorbidity and body mass index (BMI) of 31.0 to 31.9 in adult 02/21/2021   History of CVA (cerebrovascular accident) 02/09/2021   Atrial fibrillation ([place]) 02/07/2021   HLD (hyperlipidemia) 08/16/2020   Mild cognitive impairment with memory loss 03/28/2019    Type 2 diabetes mellitus without complication, without long-term current use of insulin ([place]) 12/22/2017   Thyroid activity decreased 10/02/2015   GERD 01/11/2009   Essential hypertension 12/12/2008    Past Surgical History:  Procedure Laterality Date   CYSTOSCOPY     LUNG CANCER SURGERY     Removed 1 lobe on Right lung   SKIN CANCER EXCISION  2014   Melanoma    Prior to Admission medications   Medication Sig Start Date End Date Taking? Authorizing Provider  apixaban (ELIQUIS) 2.5 MG TABS tablet Take 2 tablets (5 mg total) by mouth 2 (two) times daily. 02/14/21   Annita Brod, MD  atorvastatin (LIPITOR) 10 MG tablet TAKE 1 TABLET BY MOUTH ONCE A DAY Patient taking differently: Take 10 mg by mouth daily. 11/23/20   Jearld Fenton, NP  Blood Glucose Monitoring Suppl (TRUE FOCUS BLOOD GLUCOSE METER) DEVI 1 Device by Does not apply route daily. 02/21/21   Jearld Fenton, NP  busPIRone (BUSPAR) 5 MG tablet Take 1 tablet (5 mg total) by mouth 2 (two) times daily. 02/14/21   Annita Brod, MD  Cholecalciferol (VITAMIN D3) 1000 UNITS CAPS Take 1 capsule by mouth daily. Reported on 11/15/2015    [provider]  feeding supplement (ENSURE ENLIVE / ENSURE PLUS) LIQD Take 237 mLs by mouth 3 (three) times daily between meals. 02/14/21   Annita Brod, MD  fluocinonide (LIDEX) 0.05 % external solution Apply 1 application topically as  needed. 09/12/20   [provider]  glucose blood (TRUE FOCUS BLOOD GLUCOSE STRIP) test strip Use as instructed 02/21/21   Jearld Fenton, NP  insulin glargine (LANTUS) 100 UNIT/ML injection Inject 0.07 mLs (7 Units total) into the skin daily. 02/15/21   Annita Brod, MD  Lancets [facility] ULTRASOFT) lancets Use as instructed 02/21/21   Jearld Fenton, NP  levothyroxine (SYNTHROID) 88 MCG tablet TAKE 1 TAB BY MOUTH ONCE DAILY. TAKE ON AN EMPTY STOMACH WITH A GLASS OF WATER ATLEAST 30-60 MINUTES BEFORE BREAKFAST Patient taking differently: Take  88 mcg by mouth daily before breakfast. 02/05/21   Jearld Fenton, NP  memantine (NAMENDA) 10 MG tablet TAKE 1 TABLET BY MOUTH TWICE A DAY 02/25/21   Cameron Sprang, MD  metoprolol succinate (TOPROL-XL) 25 MG 24 hr tablet TAKE 1 TABLET BY MOUTH ONCE A DAY 03/02/21   Jearld Fenton, NP  Omega-3 Fatty Acids (FISH OIL) 1000 MG CAPS Take 1 capsule by mouth daily.    [provider]    Allergies Aricept [donepezil hcl]  Family History  Problem Relation Age of Onset   Stroke Mother    Heart disease Father    Cancer Sister        Pancreatic    Social History Social History   Tobacco Use   Smoking status: Never   Smokeless tobacco: Never  Vaping Use   Vaping Use: Never used  Substance Use Topics   Alcohol use: No    Alcohol/week: 0.0 standard drinks   Drug use: No    Review of Systems  Constitutional: No fever/chills Eyes: No visual changes. ENT: No sore throat. Cardiovascular: Denies chest pain. Respiratory: Denies shortness of breath. Gastrointestinal: No abdominal pain.  No nausea, no vomiting.  No diarrhea.  No constipation. Genitourinary: Negative for dysuria. Musculoskeletal: Negative for back pain.  Positive for left elbow pain and swelling. Skin: Negative for rash. Neurological: Negative for headaches, focal weakness or numbness.  ____________________________________________   PHYSICAL EXAM:  VITAL SIGNS: ED Triage Vitals [03/22/21 1602]  Enc Vitals Group     BP (!) 146/90     Pulse Rate (!) 123     Resp 16     Temp (!) 102.1 F (38.9 C)     Temp Source Oral     SpO2 95 %     Weight      Height      Head Circumference      Peak Flow      Pain Score      Pain Loc      Pain Edu?      Excl. in [place]?     Constitutional: Alert and oriented to person and place, but not time. Eyes: Conjunctivae are normal.  Pupils equal, round, and reactive to light bilaterally. Head: Atraumatic. Nose: No congestion/rhinnorhea. Mouth/Throat: Mucous membranes  are moist. Neck: Normal ROM, no midline cervical spine tenderness to palpation. Cardiovascular: Tachycardic, irregularly irregular rhythm. Grossly normal heart sounds. Respiratory: Normal respiratory effort.  No retractions. Lungs CTAB. Gastrointestinal: Soft and nontender. No distention. Genitourinary: deferred Musculoskeletal: Edema, erythema, warmth, and tenderness to palpation over left elbow diffusely.  Small wound noted to left olecranon with surrounding induration and purulent drainage. No lower extremity tenderness nor edema. Neurologic:  Normal speech and language.  Globally weak with no gross focal neurologic deficits appreciated. Skin:  Skin is warm, dry and intact. No rash noted. Psychiatric: Mood and affect are normal. Speech and behavior are  normal.  ____________________________________________   LABS (all labs ordered are listed, but only abnormal results are displayed)  Labs Reviewed  PROTIME-INR - Abnormal; Notable for the following components:      Result Value   Prothrombin Time 18.1 (*)    INR 1.5 (*)    All other components within normal limits  APTT - Abnormal; Notable for the following components:   aPTT 40 (*)    All other components within normal limits  CBC - Abnormal; Notable for the following components:   WBC 17.8 (*)    All other components within normal limits  DIFFERENTIAL - Abnormal; Notable for the following components:   Neutro Abs 15.7 (*)    Lymphs Abs 0.6 (*)    Monocytes Absolute 1.3 (*)    Abs Immature Granulocytes 0.14 (*)    All other components within normal limits  COMPREHENSIVE METABOLIC PANEL - Abnormal; Notable for the following components:   Sodium 129 (*)    CO2 20 (*)    Glucose, Bld 411 (*)    BUN 26 (*)    Creatinine, Ser 1.23 (*)    Albumin 3.4 (*)    AST 44 (*)    Alkaline Phosphatase 144 (*)    Total Bilirubin 1.8 (*)    GFR, Estimated 42 (*)    All other components within normal limits  LACTIC ACID, PLASMA -  Abnormal; Notable for the following components:   Lactic Acid, Venous 2.7 (*)    All other components within normal limits  URINE CULTURE  CULTURE, BLOOD (ROUTINE X 2)  CULTURE, BLOOD (ROUTINE X 2)  LACTIC ACID, PLASMA  URINALYSIS, COMPLETE (UACMP) WITH MICROSCOPIC  CBG MONITORING, ED   ____________________________________________  EKG  ED ECG REPORT I, Blake Divine, the attending physician, personally viewed and interpreted this ECG.   Date: 03/22/2021  EKG Time: 16:29  Rate: 110  Rhythm: atrial fibrillation  Axis: LAD  Intervals:left anterior fascicular block  ST&T Change: None   PROCEDURES  Procedure(s) performed (including Critical Care):  .Critical Care  Date/Time: 03/22/2021 5:23 PM Performed by: Blake Divine, MD Authorized by: Blake Divine, MD   Critical care provider statement:    Critical care time (minutes):  45   Critical care time was exclusive of:  Separately billable procedures and treating other patients and teaching time   Critical care was necessary to treat or prevent imminent or life-threatening deterioration of the following conditions:  Sepsis and CNS failure or compromise   Critical care was time spent personally by me on the following activities:  Discussions with consultants, evaluation of patient's response to treatment, examination of patient, ordering and performing treatments and interventions, ordering and review of laboratory studies, ordering and review of radiographic studies, pulse oximetry, re-evaluation of patient's condition, obtaining history from patient or surrogate and review of old charts   I assumed direction of critical care for this patient from another provider in my specialty: no     Care discussed with: admitting provider     ____________________________________________   INITIAL IMPRESSION / [place] / ED COURSE      87 year old female with past medical history of dementia, hypertension,  hyperlipidemia, diabetes, stroke, and atrial fibrillation on Eliquis who presents to the ED for left-sided weakness and recent fall.  Code stroke was called due to concern for acute onset left-sided weakness, patient also noted to be febrile and tachycardic.  CT head shows right MCA stroke that is similar to previous with no evidence of  acute hemorrhage.  Patient evaluated by Dr. Quinn Axe of neurology, who feels patient has recrudescence of her prior stroke due to her current sepsis.  Most likely source of sepsis seems to be cellulitis from left elbow wound, we will further assess with CT scan to ensure no abscess.  She is able to range her left elbow and I doubt septic arthritis.  We will hydrate with IV fluids and start broad-spectrum antibiotics.  CT scan of left arm was attempted, although patient had a difficult time remaining still for this and we were unable to obtain images.  Given low suspicion for abscess or septic arthritis at this time, risks of sedation seem to outweigh benefits of obtaining images.  Patient has received IV fluids and broad-spectrum antibiotics, remains awake and alert.  Case discussed with hospitalist for admission.      ____________________________________________   FINAL CLINICAL IMPRESSION(S) / ED DIAGNOSES  Final diagnoses:  Sepsis without acute organ dysfunction, due to unspecified organism [facility])  Left arm cellulitis     ED Discharge Orders     None        Note:  This document was prepared using Dragon voice recognition software and may include unintentional dictation errors.    Blake Divine, MD 03/22/21 [phone]

## 2021-03-22 NOTE — Consult Note (Signed)
NEUROLOGY CONSULTATION NOTE   Date of service: March 22, 2021 Patient Name: Michelle Vang MRN:  [id number] DOB:  September 21, 1928 Reason for consult: code stroke for asymmetric weakness _ _ _   _ __   _ __ _ _  __ __   _ __   __ _  History of Present Illness   87 yo woman with hx DM2, HTN, dementia requiring 24/7 assistance for safety, a fib on eliquis who presented to ED for imaging of L elbow hematoma from fall one week ago. In triage found to be tachycardic with fever to 102 and was noted to have asymmetric L>R diffuse weakness. Stroke code was called for asymmetric weakness. No clear LKW. Not a tPA candidate 2/2 therapeutic AC and no known LKW. On examination she can move all extremities though slightly less briskly on L than R. She is hard of hearing and confused. Head CT showed no no ICH, evolving R mca infarct that occurred in May with encephalomalacia in that area, no acute findings. CTA not performed 2/2 exam not c/w LVO and etiology for current presentation felt to be infection. Per chart review, L sided weakness baseline since R MCA stroke in May.    ROS   Per HPI; all other systems reviewed with daughter and were negative  Past History   Past Medical History:  Diagnosis Date  . Bladder cancer ([place])   . Diabetes mellitus without complication ([place])   . GERD (gastroesophageal reflux disease)   . Hypertension   . Lung cancer ([place])   . Thyroid disease    Past Surgical History:  Procedure Laterality Date  . CYSTOSCOPY    . LUNG CANCER SURGERY     Removed 1 lobe on Right lung  . SKIN CANCER EXCISION  2014   Melanoma   Family History  Problem Relation Age of Onset  . Stroke Mother   . Heart disease Father   . Cancer Sister        Pancreatic   Social History   Socioeconomic History  . Marital status: Widowed    Spouse name: Not on file  . Number of children: 2  . Years of education: Not on file  . Highest education level: Not on file  Occupational History  . Occupation:  Part time Scientist, water quality  Tobacco Use  . Smoking status: Never  . Smokeless tobacco: Never  Vaping Use  . Vaping Use: Never used  Substance and Sexual Activity  . Alcohol use: No    Alcohol/week: 0.0 standard drinks  . Drug use: No  . Sexual activity: Not Currently  Other Topics Concern  . Not on file  Social History Narrative   Right handed      Completed 12th grade      Lives alone has care givers    Social Determinants of Health   Financial Resource Strain: Not on file  Food Insecurity: Not on file  Transportation Needs: Not on file  Physical Activity: Not on file  Stress: Not on file  Social Connections: Not on file   Allergies  Allergen Reactions  . Aricept [Donepezil Hcl] Diarrhea    Medications   (Not in a hospital admission)    Vitals   Vitals:   03/22/21 1602  BP: (!) 146/90  Pulse: (!) 123  Resp: 16  Temp: (!) 102.1 F (38.9 C)  TempSrc: Oral  SpO2: 95%     There is no height or weight on file to calculate BMI.  Physical  Exam   Physical Exam Gen: alert, oriented to self and hospital, states year is 2021. Follows some simple commands but is hard of hearing and also confused HEENT: Atraumatic, normocephalic;mucous membranes moist; oropharynx clear, tongue without atrophy or fasciculations. Neck: Supple, trachea midline. Resp: CTAB, no w/r/r CV: regular and tachycardic Extrem: Nml bulk; no cyanosis, clubbing, or edema.  Neuro: *MS: alert, oriented to self and hospital, states year is 2021. Follows some simple commands but is hard of hearing and also confused *Speech: mild dysarthria also edentulous, able to name 3/6 objects on NIHSS *CN:    I: Deferred   II,III: PERRLA, blinks to threat bilat   III,IV,VI: EOMI w/o nystagmus, no ptosis   V: Sensation intact from V1 to V3 to LT   VII: Eyelid closure was full.  L UMN facial droop   VIII: Hearing intact to voice   IX,X: Voice normal, palate elevates symmetrically    XI: SCM/trap 5/5 bilat   XII:  Tongue protrudes midline, no atrophy or fasciculations  *Motor:   Normal bulk.  No tremor, rigidity or bradykinesia. Drift in all extremities. BUE anti-gravity, RLE anti-gravity, LLE some movement against gravity but drifts down to bed.  *Sensory: SILT. No extinction to DSS. *Coordination:  FNF intact bilat *Reflexes:  2+ throughout more brisk on L without clonus; toes down-going bilat *Gait: deferred  NIHSS = 9, all points likely related to encephalopathy or chronic deficits  1a Level of Conscious.: 0 1b LOC Questions: 1 1c LOC Commands: 0 2 Best Gaze: 0 3 Visual: 0 4 Facial Palsy: 1 5a Motor Arm - left: 1 5b Motor Arm - Right: 1 6a Motor Leg - Left: 2 6b Motor Leg - Right: 1 7 Limb Ataxia: 0 8 Sensory: 0 9 Best Language: 1 10 Dysarthria: 1 11 Extinct. and Inatten.: 0  TOTAL: 9   Premorbid mRS = 4   Labs   CBC: No results for input(s): WBC, NEUTROABS, HGB, HCT, MCV, PLT in the last 168 hours.  Basic Metabolic Panel:  Lab Results  Component Value Date   NA 140 02/09/2021   K 3.8 02/09/2021   CO2 23 02/09/2021   GLUCOSE 149 (H) 02/09/2021   BUN 29 (H) 02/09/2021   CREATININE 1.10 (H) 02/09/2021   CALCIUM 8.6 (L) 02/09/2021   GFRNONAA 48 (L) 02/09/2021   GFRAA >60 03/22/2013   Lipid Panel:  Lab Results  Component Value Date   LDLCALC 70 02/08/2021   HgbA1c:  Lab Results  Component Value Date   HGBA1C 9.4 (A) 02/05/2021   Urine Drug Screen: No results found for: LABOPIA, COCAINSCRNUR, LABBENZ, AMPHETMU, THCU, LABBARB  Alcohol Level No results found for: [facility]   Impression   51 woman with hx DM2, HTN, dementia requiring 24/7 assistance for safety, a fib on eliquis who presented to ED for imaging of L elbow hematoma from fall one week ago. In triage found to be tachycardic with fever to 102 and was noted to have asymmetric L>R diffuse weakness. Stroke code was called for asymmetric weakness however upon chart review this was baseline from her known R MCA  stroke in May. CT head showed no acute findings.  Recommendations   - Workup and tx for probably sepsis per admitting team - No further neurologic workup indicated at this time. No reason to hold eliquis from neurologic standpoint.  Neurology to sign off, but please re-engage if additional neurologic concerns arise.  ______________________________________________________________________   Thank you for the opportunity to take part in  the care of this patient. If you have any further questions, please contact the neurology consultation attending.  Signed,  Su Monks, MD [facility] [phone]  If 7pm- 7am, please page neurology on call as listed in [place].

## 2021-03-22 NOTE — ED Notes (Signed)
Pt up to BR with this RN. Most of pt's urination missed hat and fell into back of toilet. Sample that was obtained was sent to lab. Lab aware.

## 2021-03-22 NOTE — Consult Note (Signed)
Pharmacy Antibiotic Note  Michelle Vang is a 87 y.o. female admitted on 03/22/2021 with cellulitis.  Pharmacy has been consulted for vancomycin dosing.  Plan: Vancomycin 2000 mg IV loading dose, followed by 500 mg IV q24h Goal AUC 400-550 Est AUC: 440 Est Cmax: 24.5 Est Cmin: 13.5 Calculated using SCr 1.23 and Vd coefficient of 0.5  Pt is also ordered ceftriaxone 2 g IV q24h  Monitor clinical picture, renal function, and vancomycin levels at steady state F/U C&S, abx deescalation / LOT   Temp (24hrs), Avg:99.8 F (37.7 C), Min:97.5 F (36.4 C), Max:102.1 F (38.9 C)  Recent Labs  Lab 03/22/21 1600 03/22/21 1604 03/22/21 1913  WBC 17.8*  --   --   CREATININE 1.23*  --   --   LATICACIDVEN  --  2.7* 2.7*    Estimated Creatinine Clearance: 32.3 mL/min (A) (by C-G formula based on SCr of 1.23 mg/dL (H)).    Allergies  Allergen Reactions   Aricept [Donepezil Hcl] Diarrhea    Antimicrobials this admission: 7/8 metronidazole x1 in ED 7/8 cefepime x1 in ED 7/8 vancomycin >> 7/8 ceftriaxone >>   Dose adjustments this admission: N/A  Microbiology results: 7/8 BCx: pending 7/8 UCx: pending   Thank you for allowing pharmacy to be a part of this patient's care.  Darnelle Bos 03/22/2021 8:32 PM

## 2021-03-22 NOTE — ED Notes (Signed)
Patient transported to CT 

## 2021-03-23 ENCOUNTER — Encounter: Payer: Self-pay | Admitting: Internal Medicine

## 2021-03-23 ENCOUNTER — Inpatient Hospital Stay: Payer: Medicare Other

## 2021-03-23 DIAGNOSIS — Z6835 Body mass index (BMI) 35.0-35.9, adult: Secondary | ICD-10-CM | POA: Insufficient documentation

## 2021-03-23 LAB — CBC
HCT: 35.7 % — ABNORMAL LOW (ref 36.0–46.0)
Hemoglobin: 12.9 g/dL (ref 12.0–15.0)
MCH: 34.4 pg — ABNORMAL HIGH (ref 26.0–34.0)
MCHC: 36.1 g/dL — ABNORMAL HIGH (ref 30.0–36.0)
MCV: 95.2 fL (ref 80.0–100.0)
Platelets: 235 10*3/uL (ref 150–400)
RBC: 3.75 MIL/uL — ABNORMAL LOW (ref 3.87–5.11)
RDW: 12.5 % (ref 11.5–15.5)
WBC: 17.8 10*3/uL — ABNORMAL HIGH (ref 4.0–10.5)
nRBC: 0 % (ref 0.0–0.2)

## 2021-03-23 LAB — BASIC METABOLIC PANEL
Anion gap: 7 (ref 5–15)
BUN: 22 mg/dL (ref 8–23)
CO2: 23 mmol/L (ref 22–32)
Calcium: 8.4 mg/dL — ABNORMAL LOW (ref 8.9–10.3)
Chloride: 104 mmol/L (ref 98–111)
Creatinine, Ser: 1.03 mg/dL — ABNORMAL HIGH (ref 0.44–1.00)
GFR, Estimated: 52 mL/min — ABNORMAL LOW (ref >60)
Glucose, Bld: 230 mg/dL — ABNORMAL HIGH (ref 70–99)
Potassium: 3.8 mmol/L (ref 3.5–5.1)
Sodium: 134 mmol/L — ABNORMAL LOW (ref 135–145)

## 2021-03-23 LAB — LACTIC ACID, PLASMA: Lactic Acid, Venous: 2.8 mmol/L (ref 0.5–1.9)

## 2021-03-23 LAB — GLUCOSE, CAPILLARY
Glucose-Capillary: 264 mg/dL — ABNORMAL HIGH (ref 70–99)
Glucose-Capillary: 195 mg/dL — ABNORMAL HIGH (ref 70–99)

## 2021-03-23 LAB — PROCALCITONIN: Procalcitonin: 0.8 ng/mL

## 2021-03-23 MED ORDER — INSULIN GLARGINE 100 UNIT/ML ~~LOC~~ SOLN
15.0000 [IU] | Freq: Every day | SUBCUTANEOUS | Status: DC
  Filled 2021-03-23: qty 0.15

## 2021-03-23 MED ORDER — ALPRAZOLAM 0.25 MG PO TABS
0.2500 mg | ORAL_TABLET | Freq: Once | ORAL | Status: AC
  Administered 2021-03-23: 0.25 mg via ORAL
  Filled 2021-03-23: qty 1

## 2021-03-23 MED ORDER — INSULIN GLARGINE 100 UNIT/ML ~~LOC~~ SOLN
15.0000 [IU] | Freq: Every day | SUBCUTANEOUS | Status: DC
  Administered 2021-03-25 – 2021-03-27 (×3): 15 [IU] via SUBCUTANEOUS
  Filled 2021-03-23 (×4): qty 0.15

## 2021-03-23 MED ORDER — VANCOMYCIN HCL IN DEXTROSE 1-5 GM/200ML-% IV SOLN
1000.0000 mg | INTRAVENOUS | Status: DC
  Administered 2021-03-24 – 2021-03-27 (×3): 1000 mg via INTRAVENOUS
  Filled 2021-03-23 (×4): qty 200

## 2021-03-23 MED ORDER — INSULIN ASPART 100 UNIT/ML IJ SOLN
0.0000 [IU] | INTRAMUSCULAR | Status: DC
  Administered 2021-03-23: 8 [IU] via SUBCUTANEOUS
  Administered 2021-03-23 – 2021-03-24 (×3): 3 [IU] via SUBCUTANEOUS
  Administered 2021-03-24: 2 [IU] via SUBCUTANEOUS
  Administered 2021-03-24: 3 [IU] via SUBCUTANEOUS
  Administered 2021-03-24 – 2021-03-25 (×2): 2 [IU] via SUBCUTANEOUS
  Administered 2021-03-25: 5 [IU] via SUBCUTANEOUS
  Administered 2021-03-25 (×2): 3 [IU] via SUBCUTANEOUS
  Administered 2021-03-25: 2 [IU] via SUBCUTANEOUS
  Administered 2021-03-26: 5 [IU] via SUBCUTANEOUS
  Administered 2021-03-26: 3 [IU] via SUBCUTANEOUS
  Administered 2021-03-26: 5 [IU] via SUBCUTANEOUS
  Administered 2021-03-26: 3 [IU] via SUBCUTANEOUS
  Administered 2021-03-27: 2 [IU] via SUBCUTANEOUS
  Administered 2021-03-27: 3 [IU] via SUBCUTANEOUS
  Filled 2021-03-23 (×17): qty 1

## 2021-03-23 MED ORDER — LORAZEPAM 2 MG/ML IJ SOLN
1.0000 mg | Freq: Once | INTRAMUSCULAR | Status: AC
  Administered 2021-03-23: 1 mg via INTRAVENOUS
  Filled 2021-03-23: qty 1

## 2021-03-23 NOTE — Evaluation (Signed)
Physical Therapy Evaluation Patient Details Name: Michelle Vang MRN: [id number] DOB: November 13, 1928 Today's Date: 03/23/2021   History of Present Illness  87 yo Female was admitted with progression of LUE/LE weakness. Patient was recently admitted 5/26-02/14/21 with new onset R MCA infarct. Family brought her back to the ED for concern of worsening stroke. CT scan shows evolution of right MCA devloping encephalomalacia to right insula and basal ganglia. She was diagnosed with sepsis and build up of lactic acidosis. PMH significant: R MCA stroke, A-fib, CKD III, HTN, DMx2, HLD, dysphagia, history of lung/bladder CA, hypothyroidism, Hard of Hearing, Mild dementia; Daughter present at bedside to help with history intake;  Clinical Impression  88 yo Female, hard of hearing, reports feeling well. Her daughter, Michelle Vang was at bedside to help with history intake. Per her daughter, she was doing well up until the last few days and just got progressively weaker. The weakness was so severe that when she got to the hospital she required max A +2 for transferring out of car. Patient reports feeling better at time of PT evaluation. She required min A for bed mobility. She also requires min A for sit<>Stand transfer with RW. Patient does exhibit weakness in BLE grossly 4-/5. She denies any numbness/tingling. Patient was able to ambulate 175 feet with RW, with short shuffled steps, wide base of support. She occasionally will step outside RW, reaching for furniture. Patient exhibits fair standing balance. She would benefit from skilled PT Intervention to improve LE strength and mobility. Recommend home health PT upon discharge to improve mobility and safety in the home.     Follow Up Recommendations Home health PT    Equipment Recommendations  None recommended by PT    Recommendations for Other Services       Precautions / Restrictions Precautions Precautions: Fall Restrictions Weight Bearing Restrictions: No       Mobility  Bed Mobility Overal bed mobility: Needs Assistance Bed Mobility: Supine to Sit     Supine to sit: Min assist     General bed mobility comments: required 1 HHA to pull up to sitting; extra time/effort    Transfers Overall transfer level: Needs assistance Equipment used: Rolling walker (2 wheeled) Transfers: Sit to/from Stand Sit to Stand: Min assist         General transfer comment: x1 rep from bed, x1 rep from chair with increased rocking forward for momentum;  Ambulation/Gait Ambulation/Gait assistance: Min guard Gait Distance (Feet): 175 Feet Assistive device: Rolling walker (2 wheeled)   Gait velocity: decreased   General Gait Details: exhibits step through gait pattern, wide base of support, with slightly flexed posture, slower gait speed; requires CGA for safety; occasionally will step outside RW especially during turns;  Stairs            Wheelchair Mobility    Modified Rankin (Stroke Patients Only)       Balance Overall balance assessment: Needs assistance Sitting-balance support: Single extremity supported;Feet supported Sitting balance-Leahy Scale: Good     Standing balance support: Bilateral upper extremity supported;During functional activity Standing balance-Leahy Scale: Fair Standing balance comment: Requires CGA when walking with RW                             Pertinent Vitals/Pain Pain Assessment: No/denies pain    Home Living Family/patient expects to be discharged to:: Private residence Living Arrangements: Alone Available Help at Discharge: Family;Available 24 hours/day Type of Home: House  Home Access: Stairs to enter Entrance Stairs-Rails: Right;Left;Can reach both Entrance Stairs-Number of Steps: 4 Home Layout: One level Home Equipment: Walker - 4 wheels      Prior Function Level of Independence: Independent with assistive device(s);Needs assistance   Gait / Transfers Assistance Needed: would walk  holding onto furniture/using rollator as needed  ADL's / Homemaking Assistance Needed: Family assisted with bathing for safety        Hand Dominance   Dominant Hand: Right    Extremity/Trunk Assessment   Upper Extremity Assessment Upper Extremity Assessment: Defer to OT evaluation    Lower Extremity Assessment Lower Extremity Assessment: Overall WFL for tasks assessed;Generalized weakness       Communication   Communication: HOH  Cognition Arousal/Alertness: Awake/alert Behavior During Therapy: WFL for tasks assessed/performed Overall Cognitive Status: Within Functional Limits for tasks assessed                                        General Comments General comments (skin integrity, edema, etc.): Pt has swelling and wound on LUE elbow; When ambulating her wound started draining. RN informed;    Exercises     Assessment/Plan    PT Assessment Patient needs continued PT services  PT Problem List Decreased strength;Decreased mobility;Decreased activity tolerance;Decreased balance       PT Treatment Interventions DME instruction;Therapeutic exercise;Gait training;Balance training;Stair training;Neuromuscular re-education;Functional mobility training;Therapeutic activities;Patient/family education    PT Goals (Current goals can be found in the Care Plan section)  Acute Rehab PT Goals Patient Stated Goal: to go home PT Goal Formulation: With patient Time For Goal Achievement: 04/06/21 Potential to Achieve Goals: Good    Frequency Min 2X/week   Barriers to discharge   none    Co-evaluation               AM-PAC PT "6 Clicks" Mobility  Outcome Measure Help needed turning from your back to your side while in a flat bed without using bedrails?: A Little Help needed moving from lying on your back to sitting on the side of a flat bed without using bedrails?: A Little Help needed moving to and from a bed to a chair (including a wheelchair)?: A  Little Help needed standing up from a chair using your arms (e.g., wheelchair or bedside chair)?: A Little Help needed to walk in hospital room?: A Little Help needed climbing 3-5 steps with a railing? : A Lot 6 Click Score: 17    End of Session Equipment Utilized During Treatment: Gait belt Activity Tolerance: Patient tolerated treatment well;No increased pain Patient left: in chair;with call bell/phone within reach;with chair alarm set;with family/visitor present Nurse Communication: Mobility status PT Visit Diagnosis: Unsteadiness on feet (R26.81);Muscle weakness (generalized) (M62.81);Difficulty in walking, not elsewhere classified (R26.2)    Time: 7026-3785 PT Time Calculation (min) (ACUTE ONLY): 35 min   Charges:   PT Evaluation $PT Eval Low Complexity: 1 Low            Etty Isaac PT, DPT 03/23/2021, 12:44 PM

## 2021-03-23 NOTE — Patient Instructions (Signed)
RICE Therapy for Routine Care of Injuries Many injuries can be cared for with rest, ice, compression, and elevation (RICE therapy). This includes: Resting the injured body part. Putting ice on the injury. Putting pressure (compression) on the injury. Raising the injured part (elevation). Using RICE therapy can help to lessen pain and swelling. Supplies needed: Ice. Plastic bag. Towel. Elastic bandage. Pillow or pillows to raise your injured body part. How to care for your injury with RICE therapy Rest Try to rest the injured part of your body. You can go back to your normal activities when your doctor says it is okay to do them and when you can do themwithout pain. If you rest the injury too much, it may not heal as well. Some injuries heal better with early movement instead of resting for too long. Ask your doctor ifyou should do exercises to help your injury get better. Ice  If told, put ice on the injured area. To do this: Put ice in a plastic bag. Place a towel between your skin and the bag. Leave the ice on for 20 minutes, 2-3 times a day. Take off the ice if your skin turns bright red. This is very important. If you cannot feel pain, heat, or cold, you have a greater risk of damage to the area. Do not put ice on your bare skin. Use ice for as many days as your doctor tells you to use it.  Compression Put pressure on the injured area. This can be done with an elastic bandage. If this type of bandage has been put on your injury: Follow instructions on the package the bandage came in about how to use it. Do not wrap the bandage too tightly. Wrap the bandage more loosely if part of your body beyond the bandage is blue, swollen, cold, painful, or loses feeling. Take off the bandage and put it on again every 3-4 hours or as told by your doctor. See your doctor if the bandage seems to make your problems worse.  Elevation Raise the injured area above the level of your heart while you  are sitting orlying down. Follow these instructions at home: If your symptoms get worse or last a long time, make a follow-up appointment with your doctor. You may need to have imaging tests, such as X-rays or an MRI. If you have imaging tests, ask how to get your results when they are ready. Return to your normal activities when your doctor says that it is safe. Keep all follow-up visits. Contact a doctor if: You keep having pain and swelling. Your symptoms get worse. Get help right away if: You have sudden, very bad pain at your injury or lower than your injury. You have redness or more swelling around your injury. You have tingling or numbness at your injury or lower than your injury, and it does not go away when you take off the bandage. Summary Many injuries can be cared for using rest, ice, compression, and elevation (RICE therapy). You can go back to your normal activities when your doctor says it is okay and when you can do them without pain. Put ice on the injured area as told by your doctor. Get help if your symptoms get worse or if you keep having pain and swelling. This information is not intended to replace advice given to you by your health care provider. Make sure you discuss any questions you have with your healthcare provider. Document Revised: 06/21/2020 Document Reviewed: 06/21/2020 Elsevier Patient Education  [place].

## 2021-03-23 NOTE — Consult Note (Signed)
ORTHOPAEDIC CONSULTATION  REQUESTING PHYSICIAN: Nolberto Hanlon, MD  Chief Complaint:   L elbow pain and swelling   History of Present Illness: History obtained from review of chart, discussion with other medical providers, and family and patient at the bedside.  Patient has a history of mild dementia and is hard of hearing.  Michelle Vang is a 87 y.o. female who is currently admitted for concerns of recurrent symptoms of stroke and meeting sepsis criteria. She has been started on IV antibiotics. No known source, but patient does have open L elbow wound that may have been sustained after a fall ~1 week ago. There has been some fluctuance, erythema, and mild drainage in this region.  Drainage started approximately 2-3 days ago.  The patient first complained about her elbow pain yesterday morning.  Per the daughter at the bedside, the patient appears significantly improved today versus yesterday.  She lives at home with 24-hour care from family members.  She ambulates with a walker at baseline, but frequently does not use it.  Of note, she was initially admitted from 02/07/2021 - 02/14/2021 for initial stroke symptoms.  With her daughter, she did not have any significant residual deficits at that time.  She was started on Eliquis as she also has atrial fibrillation.  Other past medical history includes CKD stage III, insulin-dependent diabetes, history of lung and bladder cancer, hypothyroidism, and mild dementia.  Past Medical History:  Diagnosis Date   Bladder cancer ([place])    Diabetes mellitus without complication ([place])    GERD (gastroesophageal reflux disease)    Hypertension    Lung cancer ([place])    Thyroid disease    Past Surgical History:  Procedure Laterality Date   CYSTOSCOPY     LUNG CANCER SURGERY     Removed 1 lobe on Right lung   SKIN CANCER EXCISION  2014   Melanoma   Social History   Socioeconomic History    Marital status: Widowed    Spouse name: Not on file   Number of children: 2   Years of education: Not on file   Highest education level: Not on file  Occupational History   Occupation: Part time Scientist, water quality  Tobacco Use   Smoking status: Never   Smokeless tobacco: Never  Vaping Use   Vaping Use: Never used  Substance and Sexual Activity   Alcohol use: No    Alcohol/week: 0.0 standard drinks   Drug use: No   Sexual activity: Not Currently  Other Topics Concern   Not on file  Social History Narrative   Right handed      Completed 12th grade      Lives alone has care givers    Social Determinants of Health   Financial Resource Strain: Not on file  Food Insecurity: Not on file  Transportation Needs: Not on file  Physical Activity: Not on file  Stress: Not on file  Social Connections: Not on file   Family History  Problem Relation Age of Onset   Stroke Mother    Heart disease Father    Cancer Sister        Pancreatic   Allergies  Allergen Reactions   Aricept [Donepezil Hcl] Diarrhea   Prior to Admission medications   Medication Sig Start Date End Date Taking? Authorizing Provider  apixaban (ELIQUIS) 2.5 MG TABS tablet Take 2 tablets (5 mg total) by mouth 2 (two) times daily. 02/14/21  Yes Annita Brod, MD  atorvastatin (LIPITOR) 10 MG tablet TAKE  1 TABLET BY MOUTH ONCE A DAY Patient taking differently: Take 10 mg by mouth daily. 11/23/20  Yes Baity, Coralie Keens, NP  busPIRone (BUSPAR) 5 MG tablet Take 1 tablet (5 mg total) by mouth 2 (two) times daily. 02/14/21  Yes Annita Brod, MD  Cholecalciferol (VITAMIN D3) 1000 UNITS CAPS Take 1 capsule by mouth daily. Reported on 11/15/2015   Yes [provider]  insulin glargine (LANTUS) 100 UNIT/ML injection Inject 0.07 mLs (7 Units total) into the skin daily. Patient taking differently: Inject 10 Units into the skin daily. 02/15/21  Yes Annita Brod, MD  levothyroxine (SYNTHROID) 88 MCG tablet TAKE 1 TAB BY MOUTH  ONCE DAILY. TAKE ON AN EMPTY STOMACH WITH A GLASS OF WATER ATLEAST 30-60 MINUTES BEFORE BREAKFAST Patient taking differently: Take 88 mcg by mouth daily before breakfast. 02/05/21  Yes Baity, Coralie Keens, NP  memantine (NAMENDA) 10 MG tablet TAKE 1 TABLET BY MOUTH TWICE A DAY 02/25/21  Yes Cameron Sprang, MD  metoprolol succinate (TOPROL-XL) 25 MG 24 hr tablet TAKE 1 TABLET BY MOUTH ONCE A DAY 03/02/21  Yes Baity, Coralie Keens, NP  Omega-3 Fatty Acids (FISH OIL) 1000 MG CAPS Take 1 capsule by mouth daily.   Yes [provider]  Blood Glucose Monitoring Suppl (TRUE FOCUS BLOOD GLUCOSE METER) DEVI 1 Device by Does not apply route daily. 02/21/21   Jearld Fenton, NP  feeding supplement (ENSURE ENLIVE / ENSURE PLUS) LIQD Take 237 mLs by mouth 3 (three) times daily between meals. Patient not taking: Reported on 03/22/2021 02/14/21   Annita Brod, MD  fluocinonide (LIDEX) 0.05 % external solution Apply 1 application topically as needed. Patient not taking: Reported on 03/22/2021 09/12/20   [provider]  glucose blood (TRUE FOCUS BLOOD GLUCOSE STRIP) test strip Use as instructed 02/21/21   Jearld Fenton, NP  Lancets [facility] ULTRASOFT) lancets Use as instructed 02/21/21   Jearld Fenton, NP   Recent Labs    03/22/21 1600 03/23/21 0630  WBC 17.8* 17.8*  HGB 14.6 12.9  HCT 41.9 35.7*  PLT 281 235  K 4.2 3.8  CL 99 104  CO2 20* 23  BUN 26* 22  CREATININE 1.23* 1.03*  GLUCOSE 411* 230*  CALCIUM 8.9 8.4*  INR 1.5*  --    DG ELBOW COMPLETE LEFT (3+VIEW)  Result Date: 03/22/2021 CLINICAL DATA:  Recent fall with left elbow pain, initial encounter EXAM: LEFT ELBOW - COMPLETE 3+ VIEW COMPARISON:  None. FINDINGS: No acute fracture or dislocation is noted. No joint effusion is seen. Diffuse of tissue swelling is noted posteriorly consistent with the recent injury. No other focal abnormality is seen. IMPRESSION: Soft tissue swelling without acute bony abnormality. Electronically Signed   By:  Inez Catalina M.D.   On: 03/22/2021 20:39   DG Chest Portable 1 View  Result Date: 03/22/2021 CLINICAL DATA:  Sepsis EXAM: PORTABLE CHEST 1 VIEW COMPARISON:  None. FINDINGS: The heart size and mediastinal contours are unchanged. Aortic calcification. Biapical pleural/pulmonary scarring. No focal consolidation. No pulmonary edema. No pleural effusion. No pneumothorax. No acute osseous abnormality. IMPRESSION: No active disease. Electronically Signed   By: Iven Finn M.D.   On: 03/22/2021 17:18   CT HEAD CODE STROKE WO CONTRAST  Result Date: 03/22/2021 CLINICAL DATA:  Code stroke. EXAM: CT HEAD WITHOUT CONTRAST TECHNIQUE: Contiguous axial images were obtained from the base of the skull through the vertex without intravenous contrast. COMPARISON:  MRI Feb 08, 2021.  CT head Feb 07, 2021. FINDINGS: Motion limited exam.  Within this limitation: Brain: Evolution of the previously seen infarct in the right MCA territory with developing encephalomalacia in the right insula and basal ganglia.No definite superimposed acute/interval large vascular territory infarct. No acute hemorrhage. Similar generalized atrophy with ex vacuo ventricular dilation. The no hydrocephalus. No mass lesion or abnormal mass effect. Vascular: No hyperdense vessel identified. Calcific intracranial atherosclerosis. Skull: No acute fracture Sinuses/Orbits: Clear visualized sinuses. No acute orbital findings. Other: No mastoid effusions. ASPECTS [facility] Stroke Program Early CT Score) Total score (0-10 with 10 being normal): 10. Areas of right MCA hypoattenuation appear to correlate with prior infarcts seen on May 27 MRI. IMPRESSION: Evolution of the previously seen infarct in the right MCA territory with developing encephalomalacia in the right insula and basal ganglia. No definite superimposed acute/interval large vascular territory infarct or acute hemorrhage on this motion limited exam. An MRI could provide more sensitive evaluation for  peri-infarct ischemia if clinically indicated. Code stroke imaging results were communicated on 03/22/2021 at 4:26 pm to provider Dr. Quinn Axe via telephone, who verbally acknowledged these results. Electronically Signed   By: Margaretha Sheffield MD   On: 03/22/2021 16:29     Positive ROS: All other systems have been reviewed and were otherwise negative with the exception of those mentioned in the HPI and as above.  Physical Exam: BP 135/82 (BP Location: Right Arm)   Pulse 97   Temp 99 F (37.2 C) (Oral)   Resp 19   SpO2 100%  General:  Alert, no acute distress Psychiatric:  Patient is NOT competent for consent, normal mood and affect   Cardiovascular:  No pedal edema, regular rate and rhythm Respiratory:  No wheezing, non-labored breathing GI:  Abdomen is soft and non-tender Skin:  No lesions in the area of chief complaint, no erythema Neurologic:  Sensation intact distally, CN grossly intact Lymphatic:  No axillary or cervical lymphadenopathy  Orthopedic Exam:  LUE: +ain/pin/u motor SILT r/u/m/ax +rad pulse RoM elbow: 0-100 compared to 0-125 on contralateral side.  No limitation with supination/pronation compared to contralateral side.  There is an approximately 1 x 1 cm area of skin breakdown and active, mildly purulent drainage overlying the olecranon.  There is mild surrounding erythema.  There is mild, diffuse soft tissue swelling.  There is no definitive palpable fluid collection.  There is mild tenderness about this area.  There is significant bruising about the medial aspect of the arm extending towards the axilla.   Imaging:  X-rays show no fracture or dislocation. No signs of osteomyelitis. There is increased soft tissue swelling about the posterior elbow.   Assessment/Plan: 87 yo F with L elbow draining wound after sustaining a fall approximately 1 week ago.  Wound has been draining now for approximately 2-3 days. 1.  I discussed with the daughter and the patient the clinical  findings and likely underlying infection.  She has been on IV antibiotics, and per the daughter, the patient's overall appearance appears improved and there may be some mildly decreased erythema about the elbow as well. 2.  I recommended obtaining an MRI of the left elbow to further evaluate for definitive fluid collection.  If there is a drainable fluid collection, I would likely recommend formal irrigation and debridement in the operating room.  Per the daughter, patient will likely need some sort of sedative prior to MRI.  This was discussed with the primary hospitalist team and the patient's nurse today. 3.  I  will plan to discuss with the daughter and primary team after MRI of the elbow was obtained later today.    Leim Fabry   03/23/2021 11:28 AM

## 2021-03-23 NOTE — Plan of Care (Signed)
  Problem: Education: Goal: Knowledge of General Education information will improve Description: Including pain rating scale, medication(s)/side effects and non-pharmacologic comfort measures Outcome: Progressing   Problem: Health Behavior/Discharge Planning: Goal: Ability to manage health-related needs will improve Outcome: Progressing   Problem: Clinical Measurements: Goal: Will remain free from infection Outcome: Progressing Goal: Respiratory complications will improve Outcome: Progressing   Problem: Activity: Goal: Risk for activity intolerance will decrease Outcome: Progressing

## 2021-03-23 NOTE — Evaluation (Signed)
Occupational Therapy Evaluation Patient Details Name: Michelle Vang MRN: [id number] DOB: September 20, 1928 Today's Date: 03/23/2021    History of Present Illness 87 yo Female was admitted with progression of LUE/LE weakness. Patient was recently admitted 5/26-02/14/21 with new onset R MCA infarct. Family brought her back to the ED for concern of worsening stroke. CT scan shows evolution of right MCA devloping encephalomalacia to right insula and basal ganglia. She was diagnosed with sepsis and build up of lactic acidosis. PMH significant: R MCA stroke, A-fib, CKD III, HTN, DMx2, HLD, dysphagia, history of lung/bladder CA, hypothyroidism, Hard of Hearing, Mild dementia; Daughter present at bedside to help with history intake;   Clinical Impression   Ms. Michelle Vang presents today with generalized weakness, limited endurance, and impaired balance. She requires Min A for most ADL tasks, as well as verbal cues for safety, frequent encouragement to use RW. Pt has 24 hr personal care attendants at home, with daughter living nearby and providing frequent supervision. Prior to admission, Ms. Michelle Vang was already receiving HHOT. Recommend DC to home, with 24-hr SUPV, and with continuing [place].    Follow Up Recommendations  Home health OT    Equipment Recommendations  None recommended by OT    Recommendations for Other Services       Precautions / Restrictions Precautions Precautions: Fall Restrictions Weight Bearing Restrictions: No      Mobility Bed Mobility Overal bed mobility: Needs Assistance Bed Mobility: Supine to Sit     Supine to sit: Min assist     General bed mobility comments: received in recliner    Transfers Overall transfer level: Needs assistance Equipment used: Rolling walker (2 wheeled) Transfers: Sit to/from Stand Sit to Stand: Min assist         General transfer comment: rocking motion and several false starts necessary before pt can power up into standing    Balance  Overall balance assessment: Needs assistance Sitting-balance support: Single extremity supported;Feet supported Sitting balance-Leahy Scale: Good     Standing balance support: Bilateral upper extremity supported;During functional activity Standing balance-Leahy Scale: Fair Standing balance comment: slightly impulsive, requires VCs to continue using RW                           ADL either performed or assessed with clinical judgement   ADL Overall ADL's : Needs assistance/impaired                                             Vision Patient Visual Report: No change from baseline       Perception     Praxis      Pertinent Vitals/Pain Pain Assessment: No/denies pain     Hand Dominance Right   Extremity/Trunk Assessment Upper Extremity Assessment Upper Extremity Assessment: Generalized weakness;LUE deficits/detail LUE Deficits / Details: significant swelling, reduced elbow ROM LUE Sensation: WNL   Lower Extremity Assessment Lower Extremity Assessment: Overall WFL for tasks assessed       Communication Communication Communication: HOH   Cognition Arousal/Alertness: Awake/alert Behavior During Therapy: WFL for tasks assessed/performed Overall Cognitive Status: Within Functional Limits for tasks assessed                                     General Comments  Pt  has swelling and wound on LUE elbow; When ambulating her wound started draining. RN informed;    Exercises Other Exercises Other Exercises: Educ re: falls prevention, safety, correct use of DME   Shoulder Instructions      Home Living Family/patient expects to be discharged to:: Private residence Living Arrangements: Alone Available Help at Discharge: Family;Available 24 hours/day;Personal care attendant Type of Home: House Home Access: Stairs to enter [employer] of Steps: 4 Entrance Stairs-Rails: Right;Left;Can reach both Home Layout: One level      Bathroom Shower/Tub: Teacher, early years/pre: Standard Bathroom Accessibility: Yes   Home Equipment: Environmental consultant - 4 wheels          Prior Functioning/Environment Level of Independence: Needs assistance  Gait / Transfers Assistance Needed: "furniture walking," occasional rollator or RW use ADL's / Homemaking Assistance Needed: daughter and personal care attendants assist with bathing, IADLs Communication / Swallowing Assistance Needed: hard of hearing Comments: Mostly needs supervision with ambulation due to not wanting to use AD's.        OT Problem List: Decreased range of motion;Decreased activity tolerance;Impaired balance (sitting and/or standing);Decreased strength;Decreased knowledge of use of DME or AE      OT Treatment/Interventions:      OT Goals(Current goals can be found in the care plan section) Acute Rehab OT Goals Patient Stated Goal: to go home OT Goal Formulation: With patient/family Time For Goal Achievement: 04/06/21 Potential to Achieve Goals: Good ADL Goals Pt Will Perform Lower Body Bathing: sit to/from stand;with supervision Pt Will Transfer to Toilet: with supervision;ambulating;grab bars (w/ LRAD) Pt Will Perform Toileting - Clothing Manipulation and hygiene: with supervision;sit to/from stand Pt/caregiver will Perform Home Exercise Program: Increased strength;Both right and left upper extremity;With Supervision Additional ADL Goal #1: Pt will demo good safety awareness with use of RW for standing ADLs/fxl mobility with no verbal cues  OT Frequency: Min 1X/week   Barriers to D/C:            Co-evaluation              AM-PAC OT "6 Clicks" Daily Activity     Outcome Measure Help from another person eating meals?: None Help from another person taking care of personal grooming?: A Little Help from another person toileting, which includes using toliet, bedpan, or urinal?: A Little Help from another person bathing (including washing,  rinsing, drying)?: A Little Help from another person to put on and taking off regular upper body clothing?: A Little Help from another person to put on and taking off regular lower body clothing?: A Lot 6 Click Score: 18   End of Session Equipment Utilized During Treatment: Rolling walker  Activity Tolerance: Patient tolerated treatment well Patient left: in chair;with family/visitor present;with call bell/phone within reach;with chair alarm set  OT Visit Diagnosis: Unsteadiness on feet (R26.81);Muscle weakness (generalized) (M62.81)                Time: 5701-7793 OT Time Calculation (min): 10 min Charges:  OT General Charges $OT Visit: 1 Visit OT Evaluation $OT Eval Low Complexity: 1 Low OT Treatments $Self Care/Home Management : 8-22 mins Josiah Lobo, PhD, MS, OTR/L 03/23/21, 1:06 PM

## 2021-03-23 NOTE — Consult Note (Signed)
[facility] Nurse Consult Note: Reason for Consult:Left elbow. [facility] Nursing is simultaneously consulted with Orthopedic Surgery, who has seen the patient and who is formulating their POC (Dr.S. Posey Pronto) based on MRI findings and discussion with patient family.  [facility] Nursing input is not needed at this time. Wound type:Trauma, infectious Pressure Injury POA: N/A  WOC nursing team will not follow, but will remain available to this patient, the nursing and medical teams.  Please re-consult if needed. Thanks, Maudie Flakes, MSN, RN, [place], Arther Abbott  Pager# [phone]

## 2021-03-23 NOTE — Progress Notes (Signed)
PROGRESS NOTE    ARAIYAH CUMPTON  LGX:[id number] DOB: 1929/01/27 DOA: 03/22/2021 PCP: Jearld Fenton, NP    Brief Narrative:  Michelle Vang is a 87 y.o. female with medical history significant for recent right MCA territory stroke, persistent atrial fibrillation on Eliquis, CKD stage III, insulin-dependent type 2 diabetes, HTN, HLD, dysphagia, history of lung and bladder cancer, hypothyroidism, hard of hearing, and mild dementia who presented to the ED for evaluation of left-sided weakness.  History is supplemented by daughter at bedside.  Daughter this a.m. tells me she had generalized weakness.  She was recently admitted for stroke.  Neurology was consulted yesterday. Patient fell missing a step a week ago but did not tell daughter and yesterday complained of left arm pain.     Consultants:  Neurology, Ortho  Procedures:   Antimicrobials:  Ceftriaxone  Metronidazole x1 Vancomycin  Subjective: Complaining of right elbow pain.  No shortness of breath or chest pain  Objective: Vitals:   03/23/21 0716 03/23/21 1110 03/23/21 1231 03/23/21 1607  BP: 139/75 135/82  125/80  Pulse: 98 97 95 93  Resp:      Temp: 98.2 F (36.8 C) 99 F (37.2 C)  99.1 F (37.3 C)  TempSrc: Oral Oral  Oral  SpO2: 99% 100% 99% 100%    Intake/Output Summary (Last 24 hours) at 03/23/2021 1637 Last data filed at 03/23/2021 1001 Gross per 24 hour  Intake 1610.07 ml  Output 500 ml  Net 1110.07 ml   There were no vitals filed for this visit.  Examination:  General exam: Appears calm and comfortable  Respiratory system: Clear to auscultation. Respiratory effort normal. Cardiovascular system: S1 & S2 heard, RRR. No JVD, murmurs, rubs, gallops or clicks.  Gastrointestinal system: Abdomen is nondistended, soft and nontender.  Normal bowel sounds heard. Central nervous system: Alert and oriented. Grossly intact Extremities: no edema LE. Lt elbow mildly red, fluctuant around open wound. No discharge.  Edema.  Skin: warm, dry Psychiatry: Mood & affect appropriate.     Data Reviewed: I have personally reviewed following labs and imaging studies  CBC: Recent Labs  Lab 03/22/21 1600 03/23/21 0630  WBC 17.8* 17.8*  NEUTROABS 15.7*  --   HGB 14.6 12.9  HCT 41.9 35.7*  MCV 93.7 95.2  PLT 281 814   Basic Metabolic Panel: Recent Labs  Lab 03/22/21 1600 03/23/21 0630  NA 129* 134*  K 4.2 3.8  CL 99 104  CO2 20* 23  GLUCOSE 411* 230*  BUN 26* 22  CREATININE 1.23* 1.03*  CALCIUM 8.9 8.4*   GFR: Estimated Creatinine Clearance: 38.6 mL/min (A) (by C-G formula based on SCr of 1.03 mg/dL (H)). Liver Function Tests: Recent Labs  Lab 03/22/21 1600  AST 44*  ALT 31  ALKPHOS 144*  BILITOT 1.8*  PROT 7.3  ALBUMIN 3.4*   No results for input(s): LIPASE, AMYLASE in the last 168 hours. No results for input(s): AMMONIA in the last 168 hours. Coagulation Profile: Recent Labs  Lab 03/22/21 1600  INR 1.5*   Cardiac Enzymes: No results for input(s): CKTOTAL, CKMB, CKMBINDEX, TROPONINI in the last 168 hours. BNP (last 3 results) No results for input(s): PROBNP in the last 8760 hours. HbA1C: No results for input(s): HGBA1C in the last 72 hours. CBG: No results for input(s): GLUCAP in the last 168 hours. Lipid Profile: No results for input(s): CHOL, HDL, LDLCALC, TRIG, CHOLHDL, LDLDIRECT in the last 72 hours. Thyroid Function Tests: No results for input(s): TSH, T4TOTAL,  FREET4, T3FREE, THYROIDAB in the last 72 hours. Anemia Panel: No results for input(s): VITAMINB12, FOLATE, FERRITIN, TIBC, IRON, RETICCTPCT in the last 72 hours. Sepsis Labs: Recent Labs  Lab 03/22/21 1604 03/22/21 1913 03/23/21 0630 03/23/21 1020  PROCALCITON  --   --  0.80  --   LATICACIDVEN 2.7* 2.7*  --  2.8*    Recent Results (from the past 240 hour(s))  Culture, blood (routine x 2)     Status: None (Preliminary result)   Collection Time: 03/22/21  4:20 PM   Specimen: BLOOD  Result Value  Ref Range Status   Specimen Description BLOOD LEFT ANTECUBITAL  Final   Special Requests   Final    BOTTLES DRAWN AEROBIC AND ANAEROBIC Blood Culture results may not be optimal due to an excessive volume of blood received in culture bottles   Culture   Final    NO GROWTH < 24 HOURS Performed at [facility], [address], [place], [place] [address]    Report Status PENDING  Incomplete  Culture, blood (routine x 2)     Status: None (Preliminary result)   Collection Time: 03/22/21  4:25 PM   Specimen: BLOOD  Result Value Ref Range Status   Specimen Description BLOOD BLOOD LEFT HAND  Final   Special Requests   Final    BOTTLES DRAWN AEROBIC AND ANAEROBIC Blood Culture adequate volume   Culture   Final    NO GROWTH < 24 HOURS Performed at [facility], [address] [place], [place] [address]    Report Status PENDING  Incomplete         Radiology Studies: DG ELBOW COMPLETE LEFT (3+VIEW)  Result Date: 03/22/2021 CLINICAL DATA:  Recent fall with left elbow pain, initial encounter EXAM: LEFT ELBOW - COMPLETE 3+ VIEW COMPARISON:  None. FINDINGS: No acute fracture or dislocation is noted. No joint effusion is seen. Diffuse of tissue swelling is noted posteriorly consistent with the recent injury. No other focal abnormality is seen. IMPRESSION: Soft tissue swelling without acute bony abnormality. Electronically Signed   By: Inez Catalina M.D.   On: 03/22/2021 20:39   DG Chest Portable 1 View  Result Date: 03/22/2021 CLINICAL DATA:  Sepsis EXAM: PORTABLE CHEST 1 VIEW COMPARISON:  None. FINDINGS: The heart size and mediastinal contours are unchanged. Aortic calcification. Biapical pleural/pulmonary scarring. No focal consolidation. No pulmonary edema. No pleural effusion. No pneumothorax. No acute osseous abnormality. IMPRESSION: No active disease. Electronically Signed   By: Iven Finn M.D.   On: 03/22/2021 17:18   CT HEAD CODE STROKE WO CONTRAST  Result Date:  03/22/2021 CLINICAL DATA:  Code stroke. EXAM: CT HEAD WITHOUT CONTRAST TECHNIQUE: Contiguous axial images were obtained from the base of the skull through the vertex without intravenous contrast. COMPARISON:  MRI Feb 08, 2021.  CT head Feb 07, 2021. FINDINGS: Motion limited exam.  Within this limitation: Brain: Evolution of the previously seen infarct in the right MCA territory with developing encephalomalacia in the right insula and basal ganglia.No definite superimposed acute/interval large vascular territory infarct. No acute hemorrhage. Similar generalized atrophy with ex vacuo ventricular dilation. The no hydrocephalus. No mass lesion or abnormal mass effect. Vascular: No hyperdense vessel identified. Calcific intracranial atherosclerosis. Skull: No acute fracture Sinuses/Orbits: Clear visualized sinuses. No acute orbital findings. Other: No mastoid effusions. ASPECTS [facility] Stroke Program Early CT Score) Total score (0-10 with 10 being normal): 10. Areas of right MCA hypoattenuation appear to correlate with prior infarcts seen on May  27 MRI. IMPRESSION: Evolution of the previously seen infarct in the right MCA territory with developing encephalomalacia in the right insula and basal ganglia. No definite superimposed acute/interval large vascular territory infarct or acute hemorrhage on this motion limited exam. An MRI could provide more sensitive evaluation for peri-infarct ischemia if clinically indicated. Code stroke imaging results were communicated on 03/22/2021 at 4:26 pm to provider Dr. Quinn Axe via telephone, who verbally acknowledged these results. Electronically Signed   By: Margaretha Sheffield MD   On: 03/22/2021 16:29        Scheduled Meds:  atorvastatin  10 mg Oral Daily   busPIRone  5 mg Oral BID   insulin glargine  10 Units Subcutaneous Daily   levothyroxine  88 mcg Oral QAC breakfast   memantine  10 mg Oral BID   metoprolol succinate  25 mg Oral Daily   sodium chloride flush  3 mL  Intravenous Q12H   Continuous Infusions:  cefTRIAXone (ROCEPHIN)  IV Stopped (03/23/21 0630)   [START ON 03/24/2021] vancomycin      Assessment & Plan:   Principal Problem:   Severe sepsis with lactic acidosis (HCC) Active Problems:   Hypertension associated with diabetes (HCC)   Hypothyroidism   Insulin dependent type 2 diabetes mellitus (HCC)   Mild cognitive impairment with memory loss   Persistent atrial fibrillation (HCC)   History of CVA (cerebrovascular accident)   CKD (chronic kidney disease), stage III (HCC)   Elbow wound, left, initial encounter   JIANA LEMAIRE is a 87 y.o. female with medical history significant for recent right MCA territory stroke, persistent atrial fibrillation on Eliquis, CKD stage III, insulin-dependent type 2 diabetes, HTN, HLD, dysphagia, history of lung and bladder cancer, hypothyroidism, and mild dementia who is admitted with severe sepsis.   Severe sepsis suspected due to left elbow wound: Patient presenting with fever, tachycardia, leukocytosis, and lactic acidosis.  Left elbow wound is presumed source of infection.  CXR without evidence of  7/9 continue IV antibiotics  Follow-up blood culture and urine culture  Wound care consulted  Orthopedics was consulted due to fluctuance around her wound, concern for possible abscess, input was appreciated Ortho will obtain MRI of left elbow for further evaluation and definitive fluid collection Possible I&D in a.m. Hold Eliquis   recent right MCA stroke with recurrent left-sided weakness: Initially presented as a code stroke due to recurrent left-sided weakness.  Seen by neurology who felt CT head and exam consistent with prior stroke, no further neurologic work-up needed at this time.  Suspect recrudescence of prior stroke symptoms in setting of sepsis.  Weakness appears to have resolved with 5/5 strength throughout the extremities at time of my exam. 7/9 neurology signed off On Eliquis and statin,  holding Eliquis for possible I&D in a.m.   Hyperglycemia in setting of insulin-dependent type 2 diabetes: Worsened in setting of infection.  No sign of DKA/HHS.   7/9 lantus increased to 15 units.  RISS    Persistent atrial fibrillation: Remains in atrial fibrillation with controlled rates.   7/9 continue beta blk Holding eliquis for above.    Hypertension: Continue Toprol-XL.   CKD stage III: Stable, continue monitor.   Hypothyroidism: Continue Synthroid.   Mild dementia: Appears to be at baseline.  Continue Namenda and BuSpar.     DVT prophylaxis: Eliquis held for OR, scd Code Status: DNR Family Communication: Daughter at bedside Disposition Plan:  Status is: Inpatient  Remains inpatient appropriate because:Inpatient level of care appropriate due  to severity of illness  Dispo: The patient is from: Home              Anticipated d/c is to: Home              Patient currently is not medically stable to d/c.   Difficult to place patient No            LOS: 1 day   Time spent: 35 minutes with more than 50% on [place], MD [facility] Pager [phone]  If 7PM-7AM, please contact night-coverage 03/23/2021, 4:37 PM

## 2021-03-23 NOTE — Consult Note (Signed)
Pharmacy Antibiotic Note  Michelle Vang is a 87 y.o. female admitted on 03/22/2021 with cellulitis.  Pharmacy has been consulted for vancomycin dosing.  Plan: Change vancomycin to 1000 mg IV q36h Goal AUC 400-550 Est AUC: 506 Calculated using SCr 1.03 and Vd coefficient of 0.5  Pt is also ordered ceftriaxone 2 g IV q24h   Temp (24hrs), Avg:99 F (37.2 C), Min:97.5 F (36.4 C), Max:102.1 F (38.9 C)  Recent Labs  Lab 03/22/21 1600 03/22/21 1604 03/22/21 1913 03/23/21 0630  WBC 17.8*  --   --  17.8*  CREATININE 1.23*  --   --  1.03*  LATICACIDVEN  --  2.7* 2.7*  --      Estimated Creatinine Clearance: 38.6 mL/min (A) (by C-G formula based on SCr of 1.03 mg/dL (H)).    Allergies  Allergen Reactions   Aricept [Donepezil Hcl] Diarrhea    Antimicrobials this admission: 7/8 metronidazole x1 in ED 7/8 cefepime x1 in ED 7/8 vancomycin >> 7/8 ceftriaxone >>   Dose adjustments this admission: 7/9 vancomycin 500 mg q24h >> 1000 mg q36h  Microbiology results: 7/8 BCx: NG 7/8 UCx: pending   Thank you for allowing pharmacy to be a part of this patient's care.  Tawnya Crook, PharmD 03/23/2021 9:40 AM

## 2021-03-23 NOTE — Assessment & Plan Note (Signed)
Increase Lantus to 10 units at bedtime Increase by 1 unit every 2 days for fasting sugars less than 150 Encourage low-carb diet

## 2021-03-24 ENCOUNTER — Inpatient Hospital Stay: Payer: Medicare Other | Admitting: Anesthesiology

## 2021-03-24 ENCOUNTER — Encounter
Admission: EM | Disposition: A | Discharge status: Home Health | Payer: Self-pay | Source: Home / Self Care | Attending: Internal Medicine

## 2021-03-24 ENCOUNTER — Encounter: Payer: Self-pay | Admitting: Internal Medicine

## 2021-03-24 HISTORY — PX: APPLICATION OF WOUND VAC: SHX5189

## 2021-03-24 HISTORY — PX: I & D EXTREMITY: SHX5045

## 2021-03-24 LAB — GLUCOSE, CAPILLARY
Glucose-Capillary: 114 mg/dL — ABNORMAL HIGH (ref 70–99)
Glucose-Capillary: 137 mg/dL — ABNORMAL HIGH (ref 70–99)
Glucose-Capillary: 138 mg/dL — ABNORMAL HIGH (ref 70–99)
Glucose-Capillary: 140 mg/dL — ABNORMAL HIGH (ref 70–99)
Glucose-Capillary: 159 mg/dL — ABNORMAL HIGH (ref 70–99)
Glucose-Capillary: 171 mg/dL — ABNORMAL HIGH (ref 70–99)
Glucose-Capillary: 188 mg/dL — ABNORMAL HIGH (ref 70–99)
Glucose-Capillary: 212 mg/dL — ABNORMAL HIGH (ref 70–99)

## 2021-03-24 LAB — URINALYSIS, ROUTINE W REFLEX MICROSCOPIC
Bacteria, UA: NONE SEEN
Bilirubin Urine: NEGATIVE
Glucose, UA: >=500 mg/dL — AB
Ketones, ur: NEGATIVE mg/dL
Leukocytes,Ua: NEGATIVE
Nitrite: NEGATIVE
Protein, ur: 30 mg/dL — AB
RBC / HPF: >50 RBC/hpf — ABNORMAL HIGH (ref 0–5)
Specific Gravity, Urine: 1.011 (ref 1.005–1.030)
pH: 5 (ref 5.0–8.0)

## 2021-03-24 LAB — CREATININE, SERUM
Creatinine, Ser: 1.02 mg/dL — ABNORMAL HIGH (ref 0.44–1.00)
GFR, Estimated: 52 mL/min — ABNORMAL LOW (ref >60)

## 2021-03-24 LAB — CBC
HCT: 34.9 % — ABNORMAL LOW (ref 36.0–46.0)
Hemoglobin: 12.2 g/dL (ref 12.0–15.0)
MCH: 32.4 pg (ref 26.0–34.0)
MCHC: 35 g/dL (ref 30.0–36.0)
MCV: 92.6 fL (ref 80.0–100.0)
Platelets: 225 10*3/uL (ref 150–400)
RBC: 3.77 MIL/uL — ABNORMAL LOW (ref 3.87–5.11)
RDW: 12.7 % (ref 11.5–15.5)
WBC: 14.8 10*3/uL — ABNORMAL HIGH (ref 4.0–10.5)
nRBC: 0 % (ref 0.0–0.2)

## 2021-03-24 LAB — URINE CULTURE

## 2021-03-24 LAB — SURGICAL PCR SCREEN
MRSA, PCR: POSITIVE — AB
Staphylococcus aureus: POSITIVE — AB

## 2021-03-24 SURGERY — IRRIGATION AND DEBRIDEMENT EXTREMITY
Anesthesia: General | Site: Elbow | Laterality: Left

## 2021-03-24 MED ORDER — 0.9 % SODIUM CHLORIDE (POUR BTL) OPTIME
TOPICAL | Status: DC | PRN
  Administered 2021-03-24: 1000 mL

## 2021-03-24 MED ORDER — TRAMADOL HCL 50 MG PO TABS
50.0000 mg | ORAL_TABLET | Freq: Four times a day (QID) | ORAL | Status: DC | PRN
Start: 2021-03-24 — End: 2021-03-27

## 2021-03-24 MED ORDER — SODIUM CHLORIDE 0.9 % IV SOLN: INTRAVENOUS | Status: DC | PRN

## 2021-03-24 MED ORDER — SUCCINYLCHOLINE CHLORIDE 20 MG/ML IJ SOLN
INTRAMUSCULAR | Status: DC | PRN
  Administered 2021-03-24: 100 mg via INTRAVENOUS

## 2021-03-24 MED ORDER — OXYCODONE HCL 5 MG PO TABS
2.5000 mg | ORAL_TABLET | ORAL | Status: DC | PRN
Start: 2021-03-24 — End: 2021-03-27

## 2021-03-24 MED ORDER — FENTANYL CITRATE (PF) 100 MCG/2ML IJ SOLN: 25.0000 ug | INTRAMUSCULAR | Status: DC | PRN

## 2021-03-24 MED ORDER — ACETAMINOPHEN 500 MG PO TABS
1000.0000 mg | ORAL_TABLET | Freq: Three times a day (TID) | ORAL | Status: DC
  Administered 2021-03-24 – 2021-03-27 (×9): 1000 mg via ORAL
  Filled 2021-03-24 (×9): qty 2

## 2021-03-24 MED ORDER — OXYCODONE HCL 5 MG PO TABS: 5.0000 mg | ORAL_TABLET | Freq: Once | ORAL | Status: DC | PRN

## 2021-03-24 MED ORDER — PROPOFOL 10 MG/ML IV BOLUS
INTRAVENOUS | Status: DC | PRN
  Administered 2021-03-24: 80 mg via INTRAVENOUS

## 2021-03-24 MED ORDER — SODIUM CHLORIDE 0.45 % IV SOLN: INTRAVENOUS | Status: DC

## 2021-03-24 MED ORDER — LIDOCAINE HCL (CARDIAC) PF 100 MG/5ML IV SOSY
PREFILLED_SYRINGE | INTRAVENOUS | Status: DC | PRN
  Administered 2021-03-24: 80 mg via INTRAVENOUS

## 2021-03-24 MED ORDER — FENTANYL CITRATE (PF) 100 MCG/2ML IJ SOLN
INTRAMUSCULAR | Status: DC | PRN
  Administered 2021-03-24: 50 ug via INTRAVENOUS

## 2021-03-24 MED ORDER — PHENYLEPHRINE HCL (PRESSORS) 10 MG/ML IV SOLN
INTRAVENOUS | Status: DC | PRN
  Administered 2021-03-24 (×3): 100 ug via INTRAVENOUS

## 2021-03-24 MED ORDER — ONDANSETRON HCL 4 MG/2ML IJ SOLN
INTRAMUSCULAR | Status: DC | PRN
  Administered 2021-03-24: 4 mg via INTRAVENOUS

## 2021-03-24 MED ORDER — APIXABAN 5 MG PO TABS
5.0000 mg | ORAL_TABLET | Freq: Two times a day (BID) | ORAL | Status: DC
  Administered 2021-03-25 – 2021-03-27 (×5): 5 mg via ORAL
  Filled 2021-03-24 (×5): qty 1

## 2021-03-24 MED ORDER — CHLORHEXIDINE GLUCONATE CLOTH 2 % EX PADS: 6.0000 | MEDICATED_PAD | Freq: Every day | CUTANEOUS | Status: DC

## 2021-03-24 MED ORDER — OXYCODONE HCL 5 MG/5ML PO SOLN: 5.0000 mg | Freq: Once | ORAL | Status: DC | PRN

## 2021-03-24 SURGICAL SUPPLY — 28 items
APL PRP STRL LF ISPRP CHG 10.5 (MISCELLANEOUS) ×1
APPLICATOR CHLORAPREP 10.5 ORG (MISCELLANEOUS) ×2 IMPLANT
CANISTER SUCT 1200ML W/VALVE (MISCELLANEOUS) ×2 IMPLANT
ELECT REM PT RETURN 9FT ADLT (ELECTROSURGICAL) ×2
ELECTRODE REM PT RTRN 9FT ADLT (ELECTROSURGICAL) ×1 IMPLANT
GAUZE 4X4 16PLY ~~LOC~~+RFID DBL (SPONGE) ×2 IMPLANT
GAUZE SPONGE 4X4 12PLY STRL (GAUZE/BANDAGES/DRESSINGS) ×5 IMPLANT
GAUZE XEROFORM 1X8 LF (GAUZE/BANDAGES/DRESSINGS) IMPLANT
GLOVE SRG 8 PF TXTR STRL LF DI (GLOVE) ×1 IMPLANT
GLOVE SURG SYN 7.5  E (GLOVE) ×2
GLOVE SURG SYN 7.5 E (GLOVE) ×1 IMPLANT
GLOVE SURG SYN 7.5 PF PI (GLOVE) ×1 IMPLANT
GLOVE SURG UNDER POLY LF SZ8 (GLOVE) ×2
GOWN STRL REUS W/ TWL LRG LVL3 (GOWN DISPOSABLE) ×1 IMPLANT
GOWN STRL REUS W/ TWL XL LVL3 (GOWN DISPOSABLE) ×1 IMPLANT
GOWN STRL REUS W/TWL LRG LVL3 (GOWN DISPOSABLE) ×2
GOWN STRL REUS W/TWL XL LVL3 (GOWN DISPOSABLE) ×2
HOLDER FOLEY CATH W/STRAP (MISCELLANEOUS) ×1 IMPLANT
KIT PREVENA INCISION MGT 13 (CANNISTER) ×1 IMPLANT
KIT TURNOVER KIT A (KITS) ×2 IMPLANT
MANIFOLD NEPTUNE II (INSTRUMENTS) ×2 IMPLANT
NS IRRIG 500ML POUR BTL (IV SOLUTION) ×2 IMPLANT
PACK EXTREMITY ARMC (MISCELLANEOUS) ×2 IMPLANT
SUT ETHILON 3-0 FS-10 30 BLK (SUTURE) ×2
SUT PDS 2-0 27IN (SUTURE) ×1 IMPLANT
SUTURE EHLN 3-0 FS-10 30 BLK (SUTURE) IMPLANT
SWAB CULTURE AMIES ANAERIB BLU (MISCELLANEOUS) ×2 IMPLANT
TRAY FOLEY SLVR 16FR LF STAT (SET/KITS/TRAYS/PACK) ×1 IMPLANT

## 2021-03-24 NOTE — Progress Notes (Signed)
PT Cancellation Note  Patient Details Name: Michelle Vang MRN: [id number] DOB: [birth year]   Cancelled Treatment:    Reason Eval/Treat Not Completed: Patient at procedure or test/unavailable. Patient currently off the floor for a surgical procedure. PT to follow up as appropriate.   Minna Merritts, PT, MPT  Percell Locus 03/24/2021, 8:20 AM

## 2021-03-24 NOTE — Anesthesia Postprocedure Evaluation (Signed)
Anesthesia Post Note  Patient: Michelle Vang  Procedure(s) Performed: IRRIGATION AND DEBRIDEMENT EXTREMITY (Left: Elbow) APPLICATION OF PREVENA WOUND VAC (Left: Elbow)  Patient location during evaluation: PACU Anesthesia Type: General Level of consciousness: confused (preOp baseline confusion and sedation) Pain management: pain level controlled Vital Signs Assessment: post-procedure vital signs reviewed and stable Respiratory status: spontaneous breathing, nonlabored ventilation, respiratory function stable and patient connected to nasal cannula oxygen Cardiovascular status: blood pressure returned to baseline and stable Postop Assessment: no apparent nausea or vomiting Anesthetic complications: no   No notable events documented.   Last Vitals:  Vitals:   03/24/21 0945 03/24/21 1000  BP: 120/69   Pulse: 90 86  Resp: 18 15  Temp:    SpO2: 97% 97%    Last Pain:  Vitals:   03/24/21 0924  TempSrc: Temporal  PainSc: Asleep                 Precious Haws Kensi Karr

## 2021-03-24 NOTE — H&P (Signed)
H&P reviewed. No significant changes noted.  

## 2021-03-24 NOTE — Op Note (Addendum)
Operative Note    SURGERY DATE: 03/24/2021   PRE-OP DIAGNOSIS:  1.  Left elbow draining wound and septic olecranon bursitis   POST-OP DIAGNOSIS:  1.  Left elbow draining wound and septic olecranon bursitis   PROCEDURES:  1. Left elbow olecranon bursectomy 2. Left elbow excisional debridement including deep fascia and subcutaneous tissue   SURGEON: Cato Mulligan, MD   ANESTHESIA: Gen    ESTIMATED BLOOD LOSS: 10cc   TOTAL IV FLUIDS: per anesthesia   SPECIMEN: Culture x 2 (superficial and deep)  INDICATION(S):  Michelle Vang is a 87 y.o. female who had a fall directly on the left elbow approximately 1 week ago.  There was a noted draining wound about the left olecranon 3 days ago.  The patient began to notice more significant pain 2 days ago and was admitted to the hospital for concerns for recurrent stroke and for meeting sepsis criteria.  She is started on IV antibiotics.  MRI was suggestive of septic olecranon bursitis. After discussion of risks, benefits, and alternatives to surgery, the patient and family elected to proceed with above procedures.   OPERATIVE FINDINGS: Grossly purulent material deep to the subdermal layer and involving the muscle fascial layer.   OPERATIVE REPORT:   The patient was identified in the preoperative holding area.  The patient was brought to the operating room and placed on the table. Anesthesia was administered. The patient was then transferred to the lateral position on a beanbag. Operative arm was placed in an arm holder so elbow rested at 90 degrees. Arm was prescrubbed with Hibiclens and alcohol, prepped with ChloraPrep and draped in the usual sterile fashion. A surgical time-out occurred.  Preoperative antibiotics were not administered due to the patient already being on broad-spectrum antibiotics since the time of admission.  A small midline incision was created about the elbow incorporating the open wound and curving laterally to avoid the tip of  the olecranon.  There was immediate return of some purulent material.  This was cultured.  A rongeur was used to remove the loose tissue which included the olecranon bursa.  There was some additional purulent material and this was cultured as well.  A combination of a rongeur, curette and knife was used to debride all loose and devitalized tissue including the remainder of the olecranon bursa and subcutaneous tissue. The triceps fascia and tip of the olecranon process were then visualized.  The distal portion of the triceps fascia was loose and debrided with a rongeur. The underlying triceps muscle was healthy and viable. The bone of the olecranon tip itself was did not appear infected. The wound was thoroughly irrigated.   Appropriate hemostasis was achieved. The subdermal layer was closed with 2-0 PDS. The skin was closed with 3-0 nylon in a vertical mattress fashion.  A Prevena dressing was applied.   Instrument, sponge, and needle counts were correct prior to wound closure and at the conclusion of the case. The patient was then awakened from anesthesia without complication  POSTOPERATIVE PLAN: The patient will continue broad-spectrum IV antibiotics with potential for transition to p.o. on discharge.  Resume baseline anticoagulation on postoperative day #1. PT/OT on postoperative day #1.

## 2021-03-24 NOTE — Anesthesia Preprocedure Evaluation (Addendum)
Anesthesia Evaluation  Patient identified by MRN, date of birth, ID band Patient confused    Reviewed: Allergy & Precautions, NPO status , Patient's Chart, lab work & pertinent test results  History of Anesthesia Complications Negative for: history of anesthetic complications  Airway Mallampati: III  TM Distance: >3 FB Neck ROM: full    Dental  (+) Missing   Pulmonary neg pulmonary ROS, neg shortness of breath,    Pulmonary exam normal        Cardiovascular hypertension, (-) Past MI + dysrhythmias Atrial Fibrillation  Rhythm:irregular  hyperlipidemia   Neuro/Psych Mild cognitive impairment with memory loss  recent right MCA territory stroke CVA, Residual Symptoms negative psych ROS   GI/Hepatic Neg liver ROS, GERD  ,  Endo/Other  diabetes, Poorly Controlled, Type 2Hypothyroidism obesity  Renal/GU Renal InsufficiencyRenal diseaseCKD     Musculoskeletal   Abdominal   Peds  Hematology negative hematology ROS (+) Anticoagulation Bladder cancer Lung cancer sepsis   Anesthesia Other Findings Past Medical History: No date: Bladder cancer (HCC) No date: Diabetes mellitus without complication (HCC) No date: GERD (gastroesophageal reflux disease) No date: Hypertension No date: Lung cancer ([place]) No date: Thyroid disease  Past Surgical History: No date: CYSTOSCOPY No date: LUNG CANCER SURGERY     Comment:  Removed 1 lobe on Right lung 2014: SKIN CANCER EXCISION     Comment:  Melanoma     Reproductive/Obstetrics negative OB ROS                           Anesthesia Physical Anesthesia Plan  ASA: 4 and emergent  Anesthesia Plan: General ETT   Post-op Pain Management:    Induction: Intravenous  PONV Risk Score and Plan: Ondansetron, Dexamethasone, Midazolam and Treatment may vary due to age or medical condition  Airway Management Planned: Oral ETT  Additional Equipment:    Intra-op Plan:   Post-operative Plan: Extubation in OR  Informed Consent: I have reviewed the patients History and Physical, chart, labs and discussed the procedure including the risks, benefits and alternatives for the proposed anesthesia with the patient or authorized representative who has indicated his/her understanding and acceptance.   Patient has DNR.  Discussed DNR with power of attorney and Suspend DNR.   Dental Advisory Given  Plan Discussed with: Anesthesiologist, CRNA and Surgeon  Anesthesia Plan Comments: (History and consent from the patients daughter Allayne Stack [phone]   Daughter consented for risks of anesthesia including but not limited to:  - adverse reactions to medications - damage to eyes, teeth, lips or other oral mucosa - nerve damage due to positioning  - sore throat or hoarseness - Damage to heart, brain, nerves, lungs, other parts of body or loss of life  She voiced understanding.)       Anesthesia Quick Evaluation

## 2021-03-24 NOTE — Transfer of Care (Signed)
Immediate Anesthesia Transfer of Care Note  Patient: Michelle Vang  Procedure(s) Performed: IRRIGATION AND DEBRIDEMENT EXTREMITY (Left: Elbow) APPLICATION OF PREVENA WOUND VAC (Left: Elbow)  Patient Location: PACU  Anesthesia Type:General  Level of Consciousness: drowsy  Airway & Oxygen Therapy: Patient Spontanous Breathing and Patient connected to face mask oxygen  Post-op Assessment: Report given to RN and Post -op Vital signs reviewed and stable  Post vital signs: Reviewed and stable  Last Vitals:  Vitals Value Taken Time  BP    Temp    Pulse 89 03/24/21 0925  Resp 17 03/24/21 0925  SpO2 100 % 03/24/21 0925  Vitals shown include unvalidated device data.  Last Pain:  Vitals:   03/24/21 0734  TempSrc: Oral  PainSc:          Complications: No notable events documented.

## 2021-03-24 NOTE — Progress Notes (Signed)
PROGRESS NOTE    AILYNE PAWLEY  VOZ:[id number] DOB: [birth year] DOA: 03/22/2021 PCP: Jearld Fenton, NP    Brief Narrative:  Michelle Vang is a 87 y.o. female with medical history significant for recent right MCA territory stroke, persistent atrial fibrillation on Eliquis, CKD stage III, insulin-dependent type 2 diabetes, HTN, HLD, dysphagia, history of lung and bladder cancer, hypothyroidism, hard of hearing, and mild dementia who presented to the ED for evaluation of left-sided weakness.  History is supplemented by daughter at bedside.  Daughter this a.m. tells me she had generalized weakness.  She was recently admitted for stroke.  Neurology was consulted yesterday. Patient fell missing a step a week ago but did not tell daughter and yesterday complained of left arm pain.  Status post I&D today with application of Prevena wound VAC of left elbow   Consultants:  Neurology, Ortho  Procedures:   Antimicrobials:  Ceftriaxone  Metronidazole x1 Vancomycin  Subjective: Sleepy post op . Daughter at bedside. Foley apparently was placed with bloody dark urine, explained likely from trauma.   Objective: Vitals:   03/23/21 1607 03/23/21 2136 03/24/21 0402 03/24/21 0734  BP: 125/80 117/60 132/77 (!) 141/84  Pulse: 93 (!) 104 83 84  Resp:  20 (!) 22   Temp: 99.1 F (37.3 C) 99 F (37.2 C) 98.7 F (37.1 C) (!) 97.5 F (36.4 C)  TempSrc: Oral   Oral  SpO2: 100% 97% 98% 97%    Intake/Output Summary (Last 24 hours) at 03/24/2021 0854 Last data filed at 03/24/2021 0836 Gross per 24 hour  Intake 620 ml  Output --  Net 620 ml   There were no vitals filed for this visit.  Examination:  Sleepy, nad Cta anteriorly, no wheezing IRRegular s1/s2 no gallop Soft benign +bs No edema Mood and affect appropriate in current setting  Data Reviewed: I have personally reviewed following labs and imaging studies  CBC: Recent Labs  Lab 03/22/21 1600 03/23/21 0630 03/24/21 0439  WBC  17.8* 17.8* 14.8*  NEUTROABS 15.7*  --   --   HGB 14.6 12.9 12.2  HCT 41.9 35.7* 34.9*  MCV 93.7 95.2 92.6  PLT 281 235 425   Basic Metabolic Panel: Recent Labs  Lab 03/22/21 1600 03/23/21 0630 03/24/21 0439  NA 129* 134*  --   K 4.2 3.8  --   CL 99 104  --   CO2 20* 23  --   GLUCOSE 411* 230*  --   BUN 26* 22  --   CREATININE 1.23* 1.03* 1.02*  CALCIUM 8.9 8.4*  --    GFR: Estimated Creatinine Clearance: 39 mL/min (A) (by C-G formula based on SCr of 1.02 mg/dL (H)). Liver Function Tests: Recent Labs  Lab 03/22/21 1600  AST 44*  ALT 31  ALKPHOS 144*  BILITOT 1.8*  PROT 7.3  ALBUMIN 3.4*   No results for input(s): LIPASE, AMYLASE in the last 168 hours. No results for input(s): AMMONIA in the last 168 hours. Coagulation Profile: Recent Labs  Lab 03/22/21 1600  INR 1.5*   Cardiac Enzymes: No results for input(s): CKTOTAL, CKMB, CKMBINDEX, TROPONINI in the last 168 hours. BNP (last 3 results) No results for input(s): PROBNP in the last 8760 hours. HbA1C: No results for input(s): HGBA1C in the last 72 hours. CBG: Recent Labs  Lab 03/23/21 1705 03/23/21 2135 03/24/21 0027 03/24/21 0438 03/24/21 0735  GLUCAP 264* 195* 188* 140* 138*   Lipid Profile: No results for input(s): CHOL, HDL, LDLCALC, TRIG,  CHOLHDL, LDLDIRECT in the last 72 hours. Thyroid Function Tests: No results for input(s): TSH, T4TOTAL, FREET4, T3FREE, THYROIDAB in the last 72 hours. Anemia Panel: No results for input(s): VITAMINB12, FOLATE, FERRITIN, TIBC, IRON, RETICCTPCT in the last 72 hours. Sepsis Labs: Recent Labs  Lab 03/22/21 1604 03/22/21 1913 03/23/21 0630 03/23/21 1020  PROCALCITON  --   --  0.80  --   LATICACIDVEN 2.7* 2.7*  --  2.8*    Recent Results (from the past 240 hour(s))  Culture, blood (routine x 2)     Status: None (Preliminary result)   Collection Time: 03/22/21  4:20 PM   Specimen: BLOOD  Result Value Ref Range Status   Specimen Description BLOOD LEFT  ANTECUBITAL  Final   Special Requests   Final    BOTTLES DRAWN AEROBIC AND ANAEROBIC Blood Culture results may not be optimal due to an excessive volume of blood received in culture bottles   Culture   Final    NO GROWTH 2 DAYS Performed at [facility], [address], [place], [place] [address]    Report Status PENDING  Incomplete  Culture, blood (routine x 2)     Status: None (Preliminary result)   Collection Time: 03/22/21  4:25 PM   Specimen: BLOOD  Result Value Ref Range Status   Specimen Description BLOOD BLOOD LEFT HAND  Final   Special Requests   Final    BOTTLES DRAWN AEROBIC AND ANAEROBIC Blood Culture adequate volume   Culture   Final    NO GROWTH 2 DAYS Performed at [facility], [address], [place], [place] [address]    Report Status PENDING  Incomplete  Surgical PCR screen     Status: Abnormal   Collection Time: 03/24/21  6:58 AM   Specimen: Nasal Mucosa; Nasal Swab  Result Value Ref Range Status   MRSA, PCR POSITIVE (A) NEGATIVE Final    Comment: RESULT CALLED TO, READ BACK BY AND VERIFIED WITH: Margarite Gouge AT 2694 03/24/21.PMF    Staphylococcus aureus POSITIVE (A) NEGATIVE Final    Comment: (NOTE) The Xpert SA Assay (FDA approved for NASAL specimens in patients 55 years of age and older), is one component of a comprehensive surveillance program. It is not intended to diagnose infection nor to guide or monitor treatment. Performed at [facility], [address] [place],  [address]          Radiology Studies: DG ELBOW COMPLETE LEFT (3+VIEW)  Result Date: 03/22/2021 CLINICAL DATA:  Recent fall with left elbow pain, initial encounter EXAM: LEFT ELBOW - COMPLETE 3+ VIEW COMPARISON:  None. FINDINGS: No acute fracture or dislocation is noted. No joint effusion is seen. Diffuse of tissue swelling is noted posteriorly consistent with the recent injury. No other focal abnormality is seen. IMPRESSION: Soft tissue swelling  without acute bony abnormality. Electronically Signed   By: Inez Catalina M.D.   On: 03/22/2021 20:39   DG Chest Portable 1 View  Result Date: 03/22/2021 CLINICAL DATA:  Sepsis EXAM: PORTABLE CHEST 1 VIEW COMPARISON:  None. FINDINGS: The heart size and mediastinal contours are unchanged. Aortic calcification. Biapical pleural/pulmonary scarring. No focal consolidation. No pulmonary edema. No pleural effusion. No pneumothorax. No acute osseous abnormality. IMPRESSION: No active disease. Electronically Signed   By: Iven Finn M.D.   On: 03/22/2021 17:18   CT HEAD CODE STROKE WO CONTRAST  Result Date: 03/22/2021 CLINICAL DATA:  Code stroke. EXAM: CT HEAD WITHOUT CONTRAST TECHNIQUE: Contiguous axial images were  obtained from the base of the skull through the vertex without intravenous contrast. COMPARISON:  MRI Feb 08, 2021.  CT head Feb 07, 2021. FINDINGS: Motion limited exam.  Within this limitation: Brain: Evolution of the previously seen infarct in the right MCA territory with developing encephalomalacia in the right insula and basal ganglia.No definite superimposed acute/interval large vascular territory infarct. No acute hemorrhage. Similar generalized atrophy with ex vacuo ventricular dilation. The no hydrocephalus. No mass lesion or abnormal mass effect. Vascular: No hyperdense vessel identified. Calcific intracranial atherosclerosis. Skull: No acute fracture Sinuses/Orbits: Clear visualized sinuses. No acute orbital findings. Other: No mastoid effusions. ASPECTS [facility] Stroke Program Early CT Score) Total score (0-10 with 10 being normal): 10. Areas of right MCA hypoattenuation appear to correlate with prior infarcts seen on May 27 MRI. IMPRESSION: Evolution of the previously seen infarct in the right MCA territory with developing encephalomalacia in the right insula and basal ganglia. No definite superimposed acute/interval large vascular territory infarct or acute hemorrhage on this motion limited  exam. An MRI could provide more sensitive evaluation for peri-infarct ischemia if clinically indicated. Code stroke imaging results were communicated on 03/22/2021 at 4:26 pm to provider Dr. Quinn Axe via telephone, who verbally acknowledged these results. Electronically Signed   By: Margaretha Sheffield MD   On: 03/22/2021 16:29        Scheduled Meds:  [NWG Hold] atorvastatin  10 mg Oral Daily   [MAR Hold] busPIRone  5 mg Oral BID   [MAR Hold] insulin aspart  0-15 Units Subcutaneous Q4H   [MAR Hold] insulin glargine  15 Units Subcutaneous Daily   [MAR Hold] levothyroxine  88 mcg Oral QAC breakfast   [MAR Hold] memantine  10 mg Oral BID   [MAR Hold] metoprolol succinate  25 mg Oral Daily   [MAR Hold] sodium chloride flush  3 mL Intravenous Q12H   Continuous Infusions:  [MAR Hold] cefTRIAXone (ROCEPHIN)  IV 2 g (03/24/21 0449)   [MAR Hold] vancomycin      Assessment & Plan:   Active Problems:   Hypertension associated with diabetes ([place])   Hypothyroidism   Insulin dependent type 2 diabetes mellitus (HCC)   Mild cognitive impairment with memory loss   Persistent atrial fibrillation (HCC)   History of CVA (cerebrovascular accident)   CKD (chronic kidney disease), stage III ([place])   Atiana T [place] is a 87 y.o. female with medical history significant for recent right MCA territory stroke, persistent atrial fibrillation on Eliquis, CKD stage III, insulin-dependent type 2 diabetes, HTN, HLD, dysphagia, history of lung and bladder cancer, hypothyroidism, and mild dementia who is admitted with severe sepsis.   Severe sepsis suspected due to left elbow wound and septic olecranon bursitis Patient presenting with fever, tachycardia, leukocytosis, and lactic acidosis.  Left elbow wound is presumed source of infection.  CXR without evidence of  7/9 continue IV antibiotics  Follow-up blood culture and urine culture  Wound care consulted  Orthopedics was consulted due to fluctuance around her wound,  concern for possible abscess, input was appreciated 7/10-MRI negative for osteomyelitis or septic joint.  Concern for phlegmon or septic bursitis  Status post I&D of left elbow, left elbow olecranon bursectomy  Cultures of wound x2 superficial and deep in OR We will continue with vancomycin and taper to p.o. depending on what cultures show.  Cultures may be negative since she was started on antibiotics Resume baseline anticoagulation on postop day #1. PT OT on postop day #1 IV fluids until able  to wake up and eat   recent right MCA stroke with recurrent left-sided weakness: Initially presented as a code stroke due to recurrent left-sided weakness.  Seen by neurology who felt CT head and exam consistent with prior stroke, no further neurologic work-up needed at this time.  Suspect recrudescence of prior stroke symptoms in setting of sepsis.  Weakness appears to have resolved with 5/5 strength throughout the extremities at time of my exam. 7/10 neurology signed off On Eliquis and statin, holding Eliquis for I&D today.  Resume postop day 1      Hyperglycemia in setting of insulin-dependent type 2 diabetes: Worsened in setting of infection.  No sign of DKA/HHS.   7/10 BG stable Continue Lantus on current dose R ISS   Persistent atrial fibrillation: Remains in A. fib with controlled rates  Continue beta-blockers  Holding Eliquis as above       Hypertension: Continue Toprol-XL.   CKD stage III: Stable, continue monitor.   Hypothyroidism: Continue Synthroid.   Mild dementia: Appears to be at baseline.  Continue Namenda and BuSpar.     DVT prophylaxis: Eliquis held for OR, scd Code Status: DNR Family Communication: Daughter at bedside Disposition Plan:  Status is: Inpatient  Remains inpatient appropriate because:Inpatient level of care appropriate due to severity of illness  Dispo: The patient is from: Home              Anticipated d/c is to: Home              Patient  currently is not medically stable to d/c.   Difficult to place patient No            LOS: 2 days   Time spent: 35 minutes with more than 50% on [place], MD [facility] Pager [phone]  If 7PM-7AM, please contact night-coverage 03/24/2021, 8:54 AM

## 2021-03-24 NOTE — Anesthesia Procedure Notes (Signed)
Procedure Name: Intubation Date/Time: 03/24/2021 8:18 AM Performed by: Esaw Grandchild, CRNA Pre-anesthesia Checklist: Patient identified, Emergency Drugs available, Suction available and Patient being monitored Patient Re-evaluated:Patient Re-evaluated prior to induction Oxygen Delivery Method: Circle system utilized Preoxygenation: Pre-oxygenation with 100% oxygen Induction Type: IV induction Ventilation: Mask ventilation without difficulty Laryngoscope Size: Miller and 2 Grade View: Grade I Tube type: Oral Tube size: 7.0 mm Number of attempts: 1 Airway Equipment and Method: Stylet and Oral airway Placement Confirmation: ETT inserted through vocal cords under direct vision, positive ETCO2 and breath sounds checked- equal and bilateral Secured at: 22 cm Tube secured with: Tape Dental Injury: Teeth and Oropharynx as per pre-operative assessment

## 2021-03-25 ENCOUNTER — Encounter: Payer: Self-pay | Admitting: Orthopedic Surgery

## 2021-03-25 DIAGNOSIS — M71122 Other infective bursitis, left elbow: Secondary | ICD-10-CM

## 2021-03-25 LAB — GLUCOSE, CAPILLARY
Glucose-Capillary: 115 mg/dL — ABNORMAL HIGH (ref 70–99)
Glucose-Capillary: 146 mg/dL — ABNORMAL HIGH (ref 70–99)
Glucose-Capillary: 146 mg/dL — ABNORMAL HIGH (ref 70–99)
Glucose-Capillary: 173 mg/dL — ABNORMAL HIGH (ref 70–99)
Glucose-Capillary: 177 mg/dL — ABNORMAL HIGH (ref 70–99)
Glucose-Capillary: 186 mg/dL — ABNORMAL HIGH (ref 70–99)
Glucose-Capillary: 201 mg/dL — ABNORMAL HIGH (ref 70–99)
Glucose-Capillary: 382 mg/dL — ABNORMAL HIGH (ref 70–99)

## 2021-03-25 LAB — CBC
HCT: 33.7 % — ABNORMAL LOW (ref 36.0–46.0)
Hemoglobin: 11.5 g/dL — ABNORMAL LOW (ref 12.0–15.0)
MCH: 33.2 pg (ref 26.0–34.0)
MCHC: 34.1 g/dL (ref 30.0–36.0)
MCV: 97.4 fL (ref 80.0–100.0)
Platelets: 205 10*3/uL (ref 150–400)
RBC: 3.46 MIL/uL — ABNORMAL LOW (ref 3.87–5.11)
RDW: 12.8 % (ref 11.5–15.5)
WBC: 10.9 10*3/uL — ABNORMAL HIGH (ref 4.0–10.5)
nRBC: 0 % (ref 0.0–0.2)

## 2021-03-25 LAB — BASIC METABOLIC PANEL
Anion gap: 5 (ref 5–15)
BUN: 17 mg/dL (ref 8–23)
CO2: 25 mmol/L (ref 22–32)
Calcium: 7.7 mg/dL — ABNORMAL LOW (ref 8.9–10.3)
Chloride: 106 mmol/L (ref 98–111)
Creatinine, Ser: 1.03 mg/dL — ABNORMAL HIGH (ref 0.44–1.00)
GFR, Estimated: 52 mL/min — ABNORMAL LOW (ref >60)
Glucose, Bld: 146 mg/dL — ABNORMAL HIGH (ref 70–99)
Potassium: 3.4 mmol/L — ABNORMAL LOW (ref 3.5–5.1)
Sodium: 136 mmol/L (ref 135–145)

## 2021-03-25 LAB — LACTIC ACID, PLASMA: Lactic Acid, Venous: 1.2 mmol/L (ref 0.5–1.9)

## 2021-03-25 MED ORDER — ASCORBIC ACID 500 MG PO TABS
250.0000 mg | ORAL_TABLET | Freq: Two times a day (BID) | ORAL | Status: DC
  Administered 2021-03-25 – 2021-03-27 (×4): 250 mg via ORAL
  Filled 2021-03-25 (×4): qty 1

## 2021-03-25 MED ORDER — POTASSIUM CHLORIDE CRYS ER 20 MEQ PO TBCR
40.0000 meq | EXTENDED_RELEASE_TABLET | Freq: Once | ORAL | Status: AC
  Administered 2021-03-25: 40 meq via ORAL
  Filled 2021-03-25: qty 2

## 2021-03-25 MED ORDER — GLUCERNA SHAKE PO LIQD
237.0000 mL | Freq: Three times a day (TID) | ORAL | Status: DC
  Administered 2021-03-25 – 2021-03-27 (×6): 237 mL via ORAL

## 2021-03-25 MED ORDER — ADULT MULTIVITAMIN W/MINERALS CH
1.0000 | ORAL_TABLET | Freq: Every day | ORAL | Status: DC
  Administered 2021-03-26 – 2021-03-27 (×2): 1 via ORAL
  Filled 2021-03-25 (×2): qty 1

## 2021-03-25 MED ORDER — CHLORHEXIDINE GLUCONATE CLOTH 2 % EX PADS
6.0000 | MEDICATED_PAD | Freq: Every day | CUTANEOUS | Status: DC
  Administered 2021-03-25 – 2021-03-27 (×3): 6 via TOPICAL

## 2021-03-25 MED ORDER — MUPIROCIN 2 % EX OINT
1.0000 "application " | TOPICAL_OINTMENT | Freq: Two times a day (BID) | CUTANEOUS | Status: DC
  Administered 2021-03-25 – 2021-03-27 (×5): 1 via NASAL
  Filled 2021-03-25: qty 22

## 2021-03-25 NOTE — TOC Initial Note (Signed)
Transition of Care [facility]) - Initial/Assessment Note    Patient Details  Name: Michelle Vang MRN: [id number] Date of Birth: [birth year]  [facility]) CM/SW Contact:    Alberteen Sam, LCSW Phone Number: 03/25/2021, 12:30 PM  Clinical Narrative:                  CSW spoke with patient's daughter Michelle Vang regarding plan for home tomorrow. Confirmed active with [facility] health, agreeable to resume home health PT, OT and RN. Reports will need [facility] home to :  [place] no other questions or concerns at this time, has all DME at home which includes rolling walker, 3in1 and tub bench.    Expected Discharge Plan: [place] Barriers to Discharge: Continued Medical Work up   Patient Goals and CMS Choice Patient states their goals for this hospitalization and ongoing recovery are:: to go home CMS Medicare.gov Compare Post Acute Care list provided to:: Patient Represenative (must comment) (daughter Michelle Vang) Choice offered to / list presented to : Adult Children  Expected Discharge Plan and Services Expected Discharge Plan: [place] Acute Care Choice: [place] arrangements for the past 2 months: Single Family Home                           HH Arranged: PT, OT, RN [facility] Agency: [facility] Date [facility] Agency Contacted: 03/25/21 Time [place]: 1220 Representative spoke with at [place]: Clarise Cruz  Prior Living Arrangements/Services Living arrangements for the past 2 months: [place] Lives with:: Adult Children   Do you feel safe going back to the place where you live?: Yes          Current home services: Home PT, Home OT    Activities of Daily Living      Permission Sought/Granted Permission sought to share information with : Case Manager, Customer service manager, Family Supports Permission granted to share information with : Yes, Verbal Permission  Granted  Share Information with NAME: Michelle Vang  Permission granted to share info w AGENCY: SNFs  Permission granted to share info w Relationship: daughter  Permission granted to share info w Contact Information: [phone]  Emotional Assessment       Orientation: : Oriented to Self, Oriented to Place, Oriented to  Time, Oriented to Situation Alcohol / Substance Use: Not Applicable Psych Involvement: No (comment)  Admission diagnosis:  Left arm cellulitis [L03.114] Elbow wound, left, initial encounter [S51.002A] Sepsis without acute organ dysfunction, due to unspecified organism ([place]) [A41.9] Severe sepsis with lactic acidosis ([place]) [A41.9, E87.2, R65.20] Patient Active Problem List   Diagnosis Date Noted   Class 2 severe obesity due to excess calories with serious comorbidity and body mass index (BMI) of 35.0 to 35.9 in adult ([place]) 03/23/2021   CKD (chronic kidney disease), stage III ([place]) 03/22/2021   History of CVA (cerebrovascular accident) 02/09/2021   Persistent atrial fibrillation ([place]) 02/07/2021   HLD (hyperlipidemia) 08/16/2020   Mild cognitive impairment with memory loss 03/28/2019   Insulin dependent type 2 diabetes mellitus ([place]) 12/22/2017   Hypothyroidism 10/02/2015   GERD 01/11/2009   Hypertension associated with diabetes ([place]) 12/12/2008   PCP:  Jearld Fenton, NP Pharmacy:   [place], [place] - [address] [place] [address] Phone: [phone] Fax: [phone]  Social Determinants of Health (SDOH) Interventions    Readmission Risk Interventions No flowsheet data found.

## 2021-03-25 NOTE — Progress Notes (Signed)
Occupational Therapy Treatment Patient Details Name: SHAREL BEHNE MRN: [id number] DOB: Oct 07, 1928 Today's Date: 03/25/2021    History of present illness 87 yo Female was admitted with progression of LUE/LE weakness. Patient was recently admitted 5/26-02/14/21 with new onset R MCA infarct. Family brought her back to the ED for concern of worsening stroke. CT scan shows evolution of right MCA devloping encephalomalacia to right insula and basal ganglia. She was diagnosed with sepsis and build up of lactic acidosis. Irrigation and debridement of L elbow for septic olecranon bursitis performed on 03/24/21. PMH significant: R MCA stroke, A-fib, CKD III, HTN, DMx2, HLD, dysphagia, history of lung/bladder CA, hypothyroidism, Hard of Hearing, Mild dementia; Daughter present at bedside to help with history intake;   OT comments  Pt seen for OT treatment on this date s/p irrigation and debridement of L elbow for septic olecranon bursitis (03/24/21). Upon arrival to room, pt awake and seated upright in bed. Personal care aide a bedside and supportive throughout session. Pt agreeable to OT tx. Of note, pt is very HOH and impulsive at times, requiring multi-modal cues for safety awareness. Pt currently presents with L elbow pain, decreased strength, decreased balance, and decreased activity tolerance. D/t current impairments, pt requires MIN A for toilet transfer/hygiene, MIN GUARD for functional mobility of short household distances (~95ft) with RW, and MIN GUARD for standing hand hygiene. MAX HR 108 during mobility. At end of session, pt left in recliner, with all needs within reach, and pt in no acute distress (HR 99, BP 124/62, SpO2 98%). Pt making good progress toward goals. Pt continues to benefit from skilled OT services to maximize return to PLOF and minimize risk of future falls, injury, caregiver burden, and readmission. Will continue to follow POC. Discharge recommendation remains appropriate.     Follow Up  Recommendations  Home health OT    Equipment Recommendations  None recommended by OT       Precautions / Restrictions Precautions Precautions: Fall Restrictions Weight Bearing Restrictions: No Other Position/Activity Restrictions: Per MD, no excessive AROM, able to ext L elbow, minimize repetitive elbow flex       Mobility Bed Mobility               General bed mobility comments: not assessed as pt in recliner at beginning and end of session    Transfers Overall transfer level: Needs assistance Equipment used: Rolling walker (2 wheeled) Transfers: Sit to/from Stand Sit to Stand: Min assist         General transfer comment: Requires assistance for initial upward momentum and verbal cues for safe hand placement with RW    Balance Overall balance assessment: Needs assistance Sitting-balance support: Single extremity supported;Feet supported Sitting balance-Leahy Scale: Good     Standing balance support: Bilateral upper extremity supported;During functional activity Standing balance-Leahy Scale: Fair                             ADL either performed or assessed with clinical judgement   ADL Overall ADL's : Needs assistance/impaired     Grooming: Wash/dry hands;Min guard;Standing                   Toilet Transfer: Minimal assistance;Regular Toilet;Grab bars;Ambulation;RW Toilet Transfer Details (indicate cue type and reason): Requires assistance for initial upward momentum and verbal cues for safe hand placement with RW Toileting- Clothing Manipulation and Hygiene: Min guard;Sit to/from stand  Functional mobility during ADLs: Min guard;Rolling walker (to walk to/from bathroom)        Cognition Arousal/Alertness: Awake/alert Behavior During Therapy: WFL for tasks assessed/performed Overall Cognitive Status: Difficult to assess                                 General Comments: HOH and impulsive at times, requiring  MOD multi-modal cues for safety awareness              General Comments wound vac to L elbow    Pertinent Vitals/ Pain       Pain Assessment: Faces Faces Pain Scale: Hurts a little bit Pain Location: L elbow Pain Descriptors / Indicators: Tender Pain Intervention(s): Limited activity within patient's tolerance;Monitored during session;Repositioned         Frequency  Min 1X/week        Progress Toward Goals  OT Goals(current goals can now be found in the care plan section)  Progress towards OT goals: Progressing toward goals  Acute Rehab OT Goals Patient Stated Goal: to go home OT Goal Formulation: With patient/family Time For Goal Achievement: 04/06/21 Potential to Achieve Goals: Good  Plan Discharge plan remains appropriate;Frequency remains appropriate       AM-PAC OT "6 Clicks" Daily Activity     Outcome Measure   Help from another person eating meals?: None Help from another person taking care of personal grooming?: A Little Help from another person toileting, which includes using toliet, bedpan, or urinal?: A Little Help from another person bathing (including washing, rinsing, drying)?: A Little Help from another person to put on and taking off regular upper body clothing?: A Little Help from another person to put on and taking off regular lower body clothing?: A Lot 6 Click Score: 18    End of Session Equipment Utilized During Treatment: Rolling walker;Gait belt  OT Visit Diagnosis: Unsteadiness on feet (R26.81);Muscle weakness (generalized) (M62.81)   Activity Tolerance Patient tolerated treatment well   Patient Left in chair;with family/visitor present;with call bell/phone within reach;with chair alarm set;with nursing/sitter in room   Nurse Communication Mobility status        Time: 6415-8309 OT Time Calculation (min): 25 min  Charges: OT General Charges $OT Visit: 1 Visit OT Treatments $Self Care/Home Management : 23-37 mins  Fredirick Maudlin, OTR/L [place]

## 2021-03-25 NOTE — Progress Notes (Signed)
PROGRESS NOTE    Michelle Vang  WFU:[id number] DOB: 1929-06-01 DOA: 03/22/2021 PCP: Jearld Fenton, NP    Brief Narrative:  Michelle Vang is a 87 y.o. female with medical history significant for recent right MCA territory stroke, persistent atrial fibrillation on Eliquis, CKD stage III, insulin-dependent type 2 diabetes, HTN, HLD, dysphagia, history of lung and bladder cancer, hypothyroidism, hard of hearing, and mild dementia who presented to the ED for evaluation of left-sided weakness.  History is supplemented by daughter at bedside.  Daughter this a.m. tells me she had generalized weakness.  She was recently admitted for stroke.  Neurology was consulted yesterday. Patient fell missing a step a week ago but did not tell daughter and yesterday complained of left arm pain.  Status post I&D today with application of Prevena wound VAC of left elbow  7/11-daughter at bedside.  No overnight issues.  Patient awake and taking pills.  Foley in place.  More awake today.  Consultants:  Neurology, Ortho  Procedures:   Antimicrobials:  Ceftriaxone  Metronidazole x1 Vancomycin  Subjective: Denies pain, chest pain, or shortness of breath  Objective: Vitals:   03/25/21 0520 03/25/21 0729 03/25/21 1114 03/25/21 1533  BP: 111/65 108/64 106/75 124/62  Pulse: 66 74 84 99  Resp: 20 19 18 18   Temp: (!) 97.4 F (36.3 C) (!) 97.5 F (36.4 C) 98.6 F (37 C) 98 F (36.7 C)  TempSrc:  Oral    SpO2: 98% 100% 96% 98%    Intake/Output Summary (Last 24 hours) at 03/25/2021 1703 Last data filed at 03/25/2021 0525 Gross per 24 hour  Intake 327.74 ml  Output 950 ml  Net -622.26 ml   There were no vitals filed for this visit.  Examination: NAD calm, awake CTA no wheeze rales rhonchi's Regular S1-S2 no gallops Soft benign positive bowel sounds Left upper extremity wound VAC in place swollen No edema lower extremity Awake and alert, grossly intact  Data Reviewed: I have personally reviewed  following labs and imaging studies  CBC: Recent Labs  Lab 03/22/21 1600 03/23/21 0630 03/24/21 0439 03/25/21 0609  WBC 17.8* 17.8* 14.8* 10.9*  NEUTROABS 15.7*  --   --   --   HGB 14.6 12.9 12.2 11.5*  HCT 41.9 35.7* 34.9* 33.7*  MCV 93.7 95.2 92.6 97.4  PLT 281 235 225 202   Basic Metabolic Panel: Recent Labs  Lab 03/22/21 1600 03/23/21 0630 03/24/21 0439 03/25/21 0609  NA 129* 134*  --  136  K 4.2 3.8  --  3.4*  CL 99 104  --  106  CO2 20* 23  --  25  GLUCOSE 411* 230*  --  146*  BUN 26* 22  --  17  CREATININE 1.23* 1.03* 1.02* 1.03*  CALCIUM 8.9 8.4*  --  7.7*   GFR: Estimated Creatinine Clearance: 38.6 mL/min (A) (by C-G formula based on SCr of 1.03 mg/dL (H)). Liver Function Tests: Recent Labs  Lab 03/22/21 1600  AST 44*  ALT 31  ALKPHOS 144*  BILITOT 1.8*  PROT 7.3  ALBUMIN 3.4*   No results for input(s): LIPASE, AMYLASE in the last 168 hours. No results for input(s): AMMONIA in the last 168 hours. Coagulation Profile: Recent Labs  Lab 03/22/21 1600  INR 1.5*   Cardiac Enzymes: No results for input(s): CKTOTAL, CKMB, CKMBINDEX, TROPONINI in the last 168 hours. BNP (last 3 results) No results for input(s): PROBNP in the last 8760 hours. HbA1C: No results for input(s): HGBA1C  in the last 72 hours. CBG: Recent Labs  Lab 03/24/21 2358 03/25/21 0453 03/25/21 0755 03/25/21 1130 03/25/21 1527  GLUCAP 212* 146* 115* 146* 177*   Lipid Profile: No results for input(s): CHOL, HDL, LDLCALC, TRIG, CHOLHDL, LDLDIRECT in the last 72 hours. Thyroid Function Tests: No results for input(s): TSH, T4TOTAL, FREET4, T3FREE, THYROIDAB in the last 72 hours. Anemia Panel: No results for input(s): VITAMINB12, FOLATE, FERRITIN, TIBC, IRON, RETICCTPCT in the last 72 hours. Sepsis Labs: Recent Labs  Lab 03/22/21 1604 03/22/21 1913 03/23/21 0630 03/23/21 1020 03/25/21 0609  PROCALCITON  --   --  0.80  --   --   LATICACIDVEN 2.7* 2.7*  --  2.8* 1.2     Recent Results (from the past 240 hour(s))  Culture, blood (routine x 2)     Status: None (Preliminary result)   Collection Time: 03/22/21  4:20 PM   Specimen: BLOOD  Result Value Ref Range Status   Specimen Description BLOOD LEFT ANTECUBITAL  Final   Special Requests   Final    BOTTLES DRAWN AEROBIC AND ANAEROBIC Blood Culture results may not be optimal due to an excessive volume of blood received in culture bottles   Culture   Final    NO GROWTH 3 DAYS Performed at [facility], [address], [place], [place] [address]    Report Status PENDING  Incomplete  Culture, blood (routine x 2)     Status: None (Preliminary result)   Collection Time: 03/22/21  4:25 PM   Specimen: BLOOD  Result Value Ref Range Status   Specimen Description BLOOD BLOOD LEFT HAND  Final   Special Requests   Final    BOTTLES DRAWN AEROBIC AND ANAEROBIC Blood Culture adequate volume   Culture   Final    NO GROWTH 3 DAYS Performed at [facility], [address], [place], [place] [address]    Report Status PENDING  Incomplete  Urine culture     Status: Abnormal   Collection Time: 03/22/21  5:00 PM   Specimen: In/Out Cath Urine  Result Value Ref Range Status   Specimen Description   Final    IN/OUT CATH URINE Performed at [facility], [address] [place], [place] [address]    Special Requests   Final    NONE Performed at [facility], [address] [place], [place] [address]    Culture MULTIPLE SPECIES PRESENT, SUGGEST RECOLLECTION (A)  Final   Report Status 03/24/2021 FINAL  Final  Surgical PCR screen     Status: Abnormal   Collection Time: 03/24/21  6:58 AM   Specimen: Nasal Mucosa; Nasal Swab  Result Value Ref Range Status   MRSA, PCR POSITIVE (A) NEGATIVE Final    Comment: RESULT CALLED TO, READ BACK BY AND VERIFIED WITH: Margarite Gouge AT 3903 03/24/21.PMF    Staphylococcus aureus POSITIVE (A) NEGATIVE Final    Comment: (NOTE) The Xpert SA  Assay (FDA approved for NASAL specimens in patients 29 years of age and older), is one component of a comprehensive surveillance program. It is not intended to diagnose infection nor to guide or monitor treatment. Performed at [facility], [place], [place] [address]   Aerobic/Anaerobic Culture w Gram Stain (surgical/deep wound)     Status: None (Preliminary result)   Collection Time: 03/24/21  8:51 AM   Specimen: PATH Other; Tissue  Result Value Ref Range Status   Specimen Description   Final    WOUND  LEFT ELBOW Performed at [facility], [address], [place], [place] [address]    Special Requests   Final    NONE Performed at [facility], [place], [place] [address]    Gram Stain   Final    RARE WBC PRESENT, PREDOMINANTLY PMN RARE GRAM POSITIVE COCCI IN PAIRS    Culture   Final    ABUNDANT STAPHYLOCOCCUS AUREUS SUSCEPTIBILITIES TO FOLLOW Performed at [facility], [place] [address], [place], [place] [address]    Report Status PENDING  Incomplete  Aerobic/Anaerobic Culture w Gram Stain (surgical/deep wound)     Status: None (Preliminary result)   Collection Time: 03/24/21  8:52 AM   Specimen: PATH Other; Tissue  Result Value Ref Range Status   Specimen Description   Final    WOUND LEFT ELBOW DEEP Performed at [facility], [address] [place], [place] [address]    Special Requests   Final    NONE Performed at [facility], [address] [place], [place] [address]    Gram Stain NO WBC SEEN NO ORGANISMS SEEN   Final   Culture   Final    FEW STAPHYLOCOCCUS AUREUS SUSCEPTIBILITIES TO FOLLOW Performed at [facility], [place] [address], [place],  [address]    Report Status PENDING  Incomplete         Radiology Studies: MR ELBOW LEFT WO CONTRAST  Result Date: 03/24/2021 CLINICAL DATA:  Open wound on the left elbow with pain, erythema and drainage. EXAM: MRI OF  THE LEFT ELBOW WITHOUT CONTRAST TECHNIQUE: Multiplanar, multisequence MR imaging of the left elbow was performed. No intravenous contrast was administered. COMPARISON:  Plain films left elbow 03/22/2021. FINDINGS: Bones/Joint/Cartilage The exam is somewhat degraded by patient motion. No marrow edema to suggest osteomyelitis is seen. No elbow joint effusion. Ligaments Intact. Muscles and Tendons Intact.  No intramuscular fluid collection or edema. Soft tissues There is intense subcutaneous edema about the elbow. A skin wound is seen posterior to the olecranon. There is heterogeneous fluid in the olecranon bursa measuring approximately 2.2 cm transverse by 1.7 cm AP by 1.8 cm craniocaudal worrisome for septic bursitis. IMPRESSION: Skin wound over the olecranon with heterogeneous fluid in the olecranon bursa worrisome for phlegmon or septic bursitis. Negative for osteomyelitis or septic joint. Intense edema in subcutaneous tissues about the elbow compatible with dependent change or cellulitis. Electronically Signed   By: Inge Rise M.D.   On: 03/24/2021 09:25        Scheduled Meds:  acetaminophen  1,000 mg Oral Q8H   apixaban  5 mg Oral BID   vitamin C  250 mg Oral BID   atorvastatin  10 mg Oral Daily   busPIRone  5 mg Oral BID   Chlorhexidine Gluconate Cloth  6 each Topical Q0600   feeding supplement (GLUCERNA SHAKE)  237 mL Oral TID BM   insulin aspart  0-15 Units Subcutaneous Q4H   insulin glargine  15 Units Subcutaneous Daily   levothyroxine  88 mcg Oral QAC breakfast   memantine  10 mg Oral BID   metoprolol succinate  25 mg Oral Daily   [START ON 03/26/2021] multivitamin with minerals  1 tablet Oral Daily   mupirocin ointment  1 application Nasal BID   sodium chloride flush  3 mL Intravenous Q12H   Continuous Infusions:  vancomycin 1,000 mg (03/24/21 1458)    Assessment & Plan:   Active Problems:   Hypertension  associated with diabetes ([place])   Hypothyroidism   Insulin dependent  type 2 diabetes mellitus ([place])   Mild cognitive impairment with memory loss   Persistent atrial fibrillation (HCC)   History of CVA (cerebrovascular accident)   CKD (chronic kidney disease), stage III ([place])   Jenesys T Noori is a 87 y.o. female with medical history significant for recent right MCA territory stroke, persistent atrial fibrillation on Eliquis, CKD stage III, insulin-dependent type 2 diabetes, HTN, HLD, dysphagia, history of lung and bladder cancer, hypothyroidism, and mild dementia who is admitted with severe sepsis.   Severe sepsis suspected due to left elbow wound and septic olecranon bursitis Left elbow wound is presumed source of infection.  Wound care consulted  Orthopedics was consulted due to fluctuance around her wound, concern for possible abscess, input was appreciated MRI negative for osteomyelitis or septic joint.  Concern for phlegmon or septic bursitis  Status post I&D of left elbow, left elbow olecranon bursectomy on 7/10 Cultures of wound x2 superficial and deep in OR pending 7/11 lactic acid improved will consult PT OT since his postop day #1 DC IV fluids Will resume anticoagulation Continue vancomycin.  We will have ID consulted for p.o. medication on discharge Follow-up on culture-it could be negative due to being on antibiotics Per Ortho keep Prevena in place.  Can remove 1 battery dies in 5 to 7 days.  Can cover with dry, sterile dressing afterwards Avoid repetitive/excessive elbow flexion to keep with wound healing.  No other major restrictions with daily activity. Follow-up in 2 weeks at [facility] clinic for suture removal.   recent right MCA stroke with recurrent left-sided weakness: Initially presented as a code stroke due to recurrent left-sided weakness.  Seen by neurology who felt CT head and exam consistent with prior stroke, no further neurologic work-up needed at this time.  Suspect recrudescence of prior stroke symptoms in setting of sepsis.   Weakness appears to have resolved with 5/5 strength throughout the extremities at time of my exam. 7/10-neurology signed off Resume Eliquis    Hyperglycemia in setting of insulin-dependent type 2 diabetes: Worsened in setting of infection.  No sign of DKA/HHS.   7/11- bg stable Continue lantus and riss    Persistent atrial fibrillation: Remains in A. fib with controlled rates  7/11-continue beta blk Resume a/c  Hypokalemia-  Will replace Ck am level    Hypertension: Continue Toprol-XL.   CKD stage III: Stable, continue monitor.   Hypothyroidism: Continue Synthroid.   Mild dementia: Appears to be at baseline.  Continue Namenda and BuSpar.     DVT prophylaxis: scd, eliquis Code Status: DNR Family Communication: Daughter at bedside Disposition Plan:  Status is: Inpatient  Remains inpatient appropriate because:Inpatient level of care appropriate due to severity of illness  Dispo: The patient is from: Home              Anticipated d/c is to: Home              Patient currently is not medically stable to d/c.   Difficult to place patient No            LOS: 3 days   Time spent: 45 minutes with more than 50% on [place], MD [facility] Pager [phone]  If 7PM-7AM, please contact night-coverage 03/25/2021, 5:03 PM

## 2021-03-25 NOTE — Progress Notes (Signed)
Doing well POD#1 s/p L olecranon bursectomy and I&D. No complaints. Sitting upright in chair.   NAD, able to follow instructions Pravena in place Grossly NVI distally Soft tissue swelling, bruising still present about elbow and LUE   Cx growing staph aureus  Plan: Keep Pravena in place. Can remove when battery dies in ~5-7 days. Can cover with dry, sterile dressing afterwards. F/u cultures. Can discharge on appropriate PO Abx PT/OT while inpatient. Avoid repetitive/excessive elbow flexion to help with wound healing. No other major restrictions with daily activity F/u in ~2 weeks at [facility] for suture removal. Please contact me with any questions.

## 2021-03-25 NOTE — Progress Notes (Signed)
Initial Nutrition Assessment  DOCUMENTATION CODES:   Obesity unspecified  INTERVENTION:   Glucerna Shake po TID, each supplement provides 220 kcal and 10 grams of protein  MVI po daily   Vitamin C 247m po BID   NUTRITION DIAGNOSIS:   Increased nutrient needs related to wound healing as evidenced by estimated needs.  GOAL:   Patient will meet greater than or equal to 90% of their needs  MONITOR:   PO intake, Supplement acceptance, Labs, Weight trends, Skin, I & O's  REASON FOR ASSESSMENT:   Malnutrition Screening Tool    ASSESSMENT:   87y.o. female with medical history significant for recent right MCA territory stroke, persistent atrial fibrillation on Eliquis, CKD stage III, insulin-dependent type 2 diabetes, HTN, HLD, dysphagia, lung and bladder cancer, hypothyroidism, hard of hearing, recent COVID 19 and mild dementia who presented to the ED for evaluation of left-sided weakness and was found to have L elbow cellulitis and sepsis.  Pt s/p I & D L elbow 7/10  Met with pt in room today. Pt is a poor historian but reports good appetite and oral intake at baseline. Daughter at bedside reports that pt eats well at baseline. Pt reports that she did not eat any breakfast today; pt's lunch was sitting on her side table untouched. Daughter reports that pt has not had a chance to eat yet as she has been sleeping. Pt does not drink supplements at home but reports that she is willing to drink chocolate supplements in hospital. RD will add supplements and vitamins to support wound healing. Of note, pt is edentulous. Per chart, pt appears weight stable pta.   Medications reviewed and include: insulin, synthroid, vancomycin   Labs reviewed: K 3.4(L), creat 1.03(H) Wbc- 10.9(H) Cbgs- 146, 115, 146 x 24 hrs AIC 9.4(H)- 5/24  NUTRITION - FOCUSED PHYSICAL EXAM:  Flowsheet Row Most Recent Value  Orbital Region No depletion  Upper Arm Region No depletion  Thoracic and Lumbar Region  No depletion  Buccal Region No depletion  Temple Region No depletion  Clavicle Bone Region No depletion  Clavicle and Acromion Bone Region No depletion  Scapular Bone Region No depletion  Dorsal Hand No depletion  Patellar Region No depletion  Anterior Thigh Region No depletion  Posterior Calf Region No depletion  Edema (RD Assessment) Moderate  [LUE]  Hair Reviewed  Eyes Reviewed  Mouth Reviewed  Skin Reviewed  Nails Reviewed   Diet Order:   Diet Order             Diet Carb Modified Fluid consistency: Thin; Room service appropriate? Yes  Diet effective now                  EDUCATION NEEDS:   Education needs have been addressed  Skin:  Skin Assessment: Reviewed RN Assessment (ecchymosis, L elbow incision, VAC)  Last BM:  pta  Height:   Ht Readings from Last 1 Encounters:  03/19/21 5' 3"  (1.6 m)    Weight:   Wt Readings from Last 1 Encounters:  03/19/21 89.8 kg    Ideal Body Weight:  52 kg  BMI:  There is no height or weight on file to calculate BMI.  Estimated Nutritional Needs:   Kcal:  1700-1900kcal/day  Protein:  85-95g/day  Fluid:  1.5L/day  CKoleen DistanceMS, RD, LDN Please refer to AP[facility] Centerfor RD and/or RD on-call/weekend/after hours pager

## 2021-03-25 NOTE — Progress Notes (Signed)
Physical Therapy Treatment Patient Details Name: Michelle Vang MRN: [id number] DOB: [birth year] Today's Date: 03/25/2021    History of Present Illness 87 yo Female was admitted with progression of LUE/LE weakness. Patient was recently admitted 5/26-02/14/21 with new onset R MCA infarct. Family brought her back to the ED for concern of worsening stroke. CT scan shows evolution of right MCA devloping encephalomalacia to right insula and basal ganglia. She was diagnosed with sepsis and build up of lactic acidosis. Irrigation and debridement of L elbow for septic olecranon bursitis performed on 03/24/21. PMH significant: R MCA stroke, A-fib, CKD III, HTN, DMx2, HLD, dysphagia, history of lung/bladder CA, hypothyroidism, Hard of Hearing, Mild dementia; Daughter present at bedside to help with history intake;    PT Comments    Pt alert with daughter present during session. Pt requires min-assist for bed mobility due to not being able to support trunk on L elbow. Pt noted symptoms of lightheadedness upon upright sitting, BP: 124/60s, sx minimized following further activity. Transfers require min-assist, RW for task initiation and cues for proper RW technique. Ambulated 30 ft within room w/ RW, CGA. Pt demonstrated good ability to maneuver around obstacles without LOB. Skilled PT intervention is indicated to address deficits in function, mobility, and to return to PLOF as able.  Discharge recommendations remain HHPT.   Follow Up Recommendations  Home health PT     Equipment Recommendations  None recommended by PT    Recommendations for Other Services       Precautions / Restrictions Precautions Precautions: Fall Restrictions Weight Bearing Restrictions: No Other Position/Activity Restrictions: Per MD, no excessive AROM, able to ext L elbow, minimize repetitive elbow flex    Mobility  Bed Mobility Overal bed mobility: Needs Assistance Bed Mobility: Supine to Sit     Supine to sit: Min  assist     General bed mobility comments: Assist for trunk elevation    Transfers Overall transfer level: Needs assistance Equipment used: Rolling walker (2 wheeled) Transfers: Sit to/from Stand Sit to Stand: Min assist         General transfer comment: min assist for task initiation, cues for hand placement  Ambulation/Gait Ambulation/Gait assistance: Min guard Gait Distance (Feet): 30 Feet Assistive device: Rolling walker (2 wheeled)   Gait velocity: decreased   General Gait Details: forward flexion of trunk, able to manuever tight spaces w/ RW, CGA for safety, requires cues for hand placement   Stairs             Wheelchair Mobility    Modified Rankin (Stroke Patients Only)       Balance Overall balance assessment: Needs assistance Sitting-balance support: Single extremity supported;Feet supported Sitting balance-Leahy Scale: Good Sitting balance - Comments: Lightheaded following supine > sit; BP 124/60s, HR: 89   Standing balance support: Bilateral upper extremity supported;During functional activity Standing balance-Leahy Scale: Fair Standing balance comment: Slight impulsive, several cues required to redirect due to hearing deficit                            Cognition Arousal/Alertness: Awake/alert Behavior During Therapy: WFL for tasks assessed/performed Overall Cognitive Status: Within Functional Limits for tasks assessed                                        Exercises      General Comments General  comments (skin integrity, edema, etc.): Wound vac to L elbow      Pertinent Vitals/Pain Pain Assessment: Faces Faces Pain Scale: Hurts a little bit Pain Location: L elbow Pain Descriptors / Indicators: Tender Pain Intervention(s): Limited activity within patient's tolerance;Monitored during session;Repositioned    Home Living                      Prior Function            PT Goals (current goals  can now be found in the care plan section) Acute Rehab PT Goals Patient Stated Goal: to go home PT Goal Formulation: With patient Time For Goal Achievement: 04/06/21 Potential to Achieve Goals: Good Progress towards PT goals: Progressing toward goals    Frequency    Min 2X/week      PT Plan Current plan remains appropriate    Co-evaluation              AM-PAC PT "6 Clicks" Mobility   Outcome Measure  Help needed turning from your back to your side while in a flat bed without using bedrails?: A Little Help needed moving from lying on your back to sitting on the side of a flat bed without using bedrails?: A Little Help needed moving to and from a bed to a chair (including a wheelchair)?: A Little Help needed standing up from a chair using your arms (e.g., wheelchair or bedside chair)?: A Little Help needed to walk in hospital room?: A Little Help needed climbing 3-5 steps with a railing? : A Lot 6 Click Score: 17    End of Session Equipment Utilized During Treatment: Gait belt Activity Tolerance: Patient tolerated treatment well;No increased pain Patient left: in chair;with call bell/phone within reach;with chair alarm set;with family/visitor present Nurse Communication: Mobility status PT Visit Diagnosis: Unsteadiness on feet (R26.81);Muscle weakness (generalized) (M62.81);Difficulty in walking, not elsewhere classified (R26.2)     Time: 1026-1100 PT Time Calculation (min) (ACUTE ONLY): 34 min  Charges:                        [employer], SPT

## 2021-03-25 NOTE — Care Management Important Message (Signed)
Important Message  Patient Details  Name: Michelle Vang MRN: [id number] Date of Birth: June 03, 1929   Medicare Important Message Given:  Yes     Dannette Barbara 03/25/2021, 11:48 AM

## 2021-03-25 NOTE — Consult Note (Signed)
NAME: Michelle Vang  DOB: 28-Mar-1929  MRN: [id number]  Date/Time: 03/25/2021 11:50 AM  REQUESTING PROVIDER: Dr. Kurtis Bushman Subjective:  REASON FOR CONSULT: Left elbow wound Patient does not give history secondary to age, neurocognitive impairment and hearing loss Michelle Vang is a 87 y.o. female with a history of CVA ( rt MCA stroke) Afib on eliquis, neurocog impairement, HTN, HLD is admitted with left elbow pain and swelling.  Patient reported to her PCP on 03/22/2021 complaining of left arm pain, swelling and bruising following a fall which she sustained 3 days ago.  As per daughter patient was walking up the steps to the back of her house when she fell backwards.  PCP scheduled an x-ray and asked her to use ice and elevation.  But daughter noted that she was weak on the left side when she arrived in the hospital for an x-ray.  So she was brought directly to the ED and code stroke was activated on arrival.  CT head done on the ED showed right MCA stroke that was similar to the previous one with no evidence of acute hemorrhage.  She was also found to have a swollen elbow with erythema. Vitals in the ED temperature of 97.5, BP 146/91, heart rate of 101.  Labs revealed WBC of 17.8, Hb 14.6 and platelet of 281.  Creatinine was 1.23.  Later her temperature went up to 102.1.  Blood culture was sent and she was started on vancomycin and cefepime and metronidazole.  The latter 2 was changed to ceftriaxone. MRI of the elbow revealed intense subcutaneous edema about the elbow.  Skin wound present in the posterior aspect of the olecranon.  With heterogeneous fluid in the olecranon bursa suggestive of septic bursitis.  There was no evidence of osteomyelitis or septic joint.  She underwent left elbow olecranon bursectomy and excisional debridement including deep fascia and subcutaneous tissue on 03/24/2021 by Dr. Posey Pronto.  The elbow joint was not involved.  Cultures were sent.  Gram stain shows gram-positive cocci in  pairs. I am asked to see the patient for antibiotic recommendation for discharge.  Patient was recently in the hospital between 02/07/2021 until 6-2/22 for falls, weakness, somnolence and dehydration.  Incidentally also was found to have COVID-19 infection.  Was then diagnosed with acute right MCA stroke by MRI.  She was later discharged home.  She has 24-hour nurse support at home Past Medical History:  Diagnosis Date   Bladder cancer ([place])    Diabetes mellitus without complication ([place])    GERD (gastroesophageal reflux disease)    Hypertension    Lung cancer ([place])    Thyroid disease     Past Surgical History:  Procedure Laterality Date   APPLICATION OF WOUND VAC Left 03/24/2021   Procedure: APPLICATION OF PREVENA WOUND VAC;  Surgeon: Leim Fabry, MD;  Location: [facility] ORS;  Service: Orthopedics;  Laterality: Left;   CYSTOSCOPY     I & D EXTREMITY Left 03/24/2021   Procedure: IRRIGATION AND DEBRIDEMENT EXTREMITY;  Surgeon: Leim Fabry, MD;  Location: [facility] ORS;  Service: Orthopedics;  Laterality: Left;   LUNG CANCER SURGERY     Removed 1 lobe on Right lung   SKIN CANCER EXCISION  2014   Melanoma    Social History   Socioeconomic History   Marital status: Widowed    Spouse name: Not on file   Number of children: 2   Years of education: Not on file   Highest education level: Not on file  Occupational History   Occupation: Part time Scientist, water quality  Tobacco Use   Smoking status: Never   Smokeless tobacco: Never  Vaping Use   Vaping Use: Never used  Substance and Sexual Activity   Alcohol use: No    Alcohol/week: 0.0 standard drinks   Drug use: No   Sexual activity: Not Currently  Other Topics Concern   Not on file  Social History Narrative   Right handed      Completed 12th grade      Lives alone has care givers    Social Determinants of Health   Financial Resource Strain: Not on file  Food Insecurity: Not on file  Transportation Needs: Not on file  Physical Activity: Not  on file  Stress: Not on file  Social Connections: Not on file  Intimate Partner Violence: Not on file    Family History  Problem Relation Age of Onset   Stroke Mother    Heart disease Father    Cancer Sister        Pancreatic   Allergies  Allergen Reactions   Aricept [Donepezil Hcl] Diarrhea   I? Current Facility-Administered Medications  Medication Dose Route Frequency Provider Last Rate Last Admin   acetaminophen (TYLENOL) tablet 1,000 mg  1,000 mg Oral Q8H Leim Fabry, MD   1,000 mg at 03/25/21 0503   apixaban (ELIQUIS) tablet 5 mg  5 mg Oral BID Leim Fabry, MD   5 mg at 03/25/21 0953   atorvastatin (LIPITOR) tablet 10 mg  10 mg Oral Daily Leim Fabry, MD   10 mg at 03/25/21 0953   busPIRone (BUSPAR) tablet 5 mg  5 mg Oral BID Leim Fabry, MD   5 mg at 03/25/21 4401   cefTRIAXone (ROCEPHIN) 2 g in sodium chloride 0.9 % 100 mL IVPB  2 g Intravenous Q24H Leim Fabry, MD 200 mL/hr at 03/25/21 0458 2 g at 03/25/21 0458   Chlorhexidine Gluconate Cloth 2 % PADS 6 each  6 each Topical Q0600 Nolberto Hanlon, MD   6 each at 03/25/21 0502   insulin aspart (novoLOG) injection 0-15 Units  0-15 Units Subcutaneous Q4H Leim Fabry, MD   2 Units at 03/25/21 0454   insulin glargine (LANTUS) injection 15 Units  15 Units Subcutaneous Daily Leim Fabry, MD   15 Units at 03/25/21 0272   levothyroxine (SYNTHROID) tablet 88 mcg  88 mcg Oral QAC breakfast Leim Fabry, MD   88 mcg at 03/25/21 0503   memantine (NAMENDA) tablet 10 mg  10 mg Oral BID Leim Fabry, MD   10 mg at 03/25/21 5366   metoprolol succinate (TOPROL-XL) 24 hr tablet 25 mg  25 mg Oral Daily Leim Fabry, MD   25 mg at 03/25/21 0953   mupirocin ointment (BACTROBAN) 2 % 1 application  1 application Nasal BID Nolberto Hanlon, MD   1 application at 44/03/47 4259   oxyCODONE (Oxy IR/ROXICODONE) immediate release tablet 2.5 mg  2.5 mg Oral Q4H PRN Leim Fabry, MD       senna-docusate (Senokot-S) tablet 1 tablet  1 tablet Oral QHS PRN  Leim Fabry, MD       sodium chloride flush (NS) 0.9 % injection 3 mL  3 mL Intravenous Q12H Leim Fabry, MD   3 mL at 03/25/21 0953   traMADol (ULTRAM) tablet 50 mg  50 mg Oral Q6H PRN Leim Fabry, MD       vancomycin (VANCOCIN) IVPB 1000 mg/200 mL premix  1,000 mg Intravenous Q36H Leim Fabry, MD 200  mL/hr at 03/24/21 1458 1,000 mg at 03/24/21 1458     Abtx:  Anti-infectives (From admission, onward)    Start     Dose/Rate Route Frequency Ordered Stop   03/24/21 0800  vancomycin (VANCOCIN) IVPB 1000 mg/200 mL premix        1,000 mg 200 mL/hr over 60 Minutes Intravenous Every 36 hours 03/23/21 0943     03/23/21 1900  vancomycin (VANCOREADY) IVPB 500 mg/100 mL  Status:  Discontinued        500 mg 100 mL/hr over 60 Minutes Intravenous Every 24 hours 03/22/21 2128 03/23/21 0943   03/23/21 0800  cefTRIAXone (ROCEPHIN) 2 g in sodium chloride 0.9 % 100 mL IVPB  Status:  Discontinued        2 g 200 mL/hr over 30 Minutes Intravenous Every 24 hours 03/22/21 2245 03/22/21 2246   03/23/21 0500  cefTRIAXone (ROCEPHIN) 2 g in sodium chloride 0.9 % 100 mL IVPB        2 g 200 mL/hr over 30 Minutes Intravenous Every 24 hours 03/22/21 2246     03/23/21 0100  cefTRIAXone (ROCEPHIN) 2 g in sodium chloride 0.9 % 100 mL IVPB  Status:  Discontinued        2 g 200 mL/hr over 30 Minutes Intravenous Every 24 hours 03/22/21 2014 03/22/21 2245   03/22/21 1700  vancomycin (VANCOREADY) IVPB 2000 mg/400 mL        2,000 mg 200 mL/hr over 120 Minutes Intravenous  Once 03/22/21 1653 03/22/21 2126   03/22/21 1645  ceFEPIme (MAXIPIME) 2 g in sodium chloride 0.9 % 100 mL IVPB        2 g 200 mL/hr over 30 Minutes Intravenous  Once 03/22/21 1643 03/22/21 1743   03/22/21 1645  metroNIDAZOLE (FLAGYL) IVPB 500 mg        500 mg 100 mL/hr over 60 Minutes Intravenous  Once 03/22/21 1643 03/22/21 1838   03/22/21 1645  vancomycin (VANCOCIN) IVPB 1000 mg/200 mL premix  Status:  Discontinued        1,000 mg 200 mL/hr over  60 Minutes Intravenous  Once 03/22/21 1643 03/22/21 1653       REVIEW OF SYSTEMS: Not available Patient says she is feeling fine  Objective:  VITALS:  BP 106/75   Pulse 84   Temp 98.6 F (37 C)   Resp 18   SpO2 96%  PHYSICAL EXAM:  General: Awake, cooperative, no distress, hard of hearing. Follows simple commands  head: Normocephalic, without obvious abnormality, atraumatic. Eyes: Conjunctivae clear, anicteric sclerae. Pupils are equal ENT Nares normal. No drainage or sinus tenderness. Lips, mucosa, and tongue normal. No Thrush Neck: Supple, symmetrical, no adenopathy, thyroid: non tender no carotid bruit and no JVD. Back: Did not examine Lungs: Clear to auscultation bilaterally. No Wheezing or Rhonchi. No rales. Heart: Irregular but well controlled Abdomen: Soft, non-tender,not distended. Bowel sounds normal. No masses Extremities: Left elbow swollen, bruising, erythema, surgical dressing present Picture taken prior to the surgery was reviewed  Skin: No rashes or lesions. Or bruising Lymph: Cervical, supraclavicular normal. Neurologic: Cannot assess in detail Pertinent Labs Lab Results CBC    Component Value Date/Time   WBC 10.9 (H) 03/25/2021 0609   RBC 3.46 (L) 03/25/2021 0609   HGB 11.5 (L) 03/25/2021 0609   HGB 13.0 09/22/2012 0934   HCT 33.7 (L) 03/25/2021 0609   HCT 39.4 09/22/2012 0934   PLT 205 03/25/2021 0609   PLT 253 09/22/2012 0934   MCV 97.4 03/25/2021  0609   MCV 91 09/22/2012 0934   MCH 33.2 03/25/2021 0609   MCHC 34.1 03/25/2021 0609   RDW 12.8 03/25/2021 0609   RDW 13.0 09/22/2012 0934   LYMPHSABS 0.6 (L) 03/22/2021 1600   LYMPHSABS 1.7 09/22/2012 0934   MONOABS 1.3 (H) 03/22/2021 1600   MONOABS 0.7 09/22/2012 0934   EOSABS 0.0 03/22/2021 1600   EOSABS 0.2 09/22/2012 0934   BASOSABS 0.1 03/22/2021 1600   BASOSABS 0.1 09/22/2012 0934    CMP Latest Ref Rng & Units 03/25/2021 03/24/2021 03/23/2021  Glucose 70 - 99 mg/dL 146(H) - 230(H)  BUN  8 - 23 mg/dL 17 - 22  Creatinine 0.44 - 1.00 mg/dL 1.03(H) 1.02(H) 1.03(H)  Sodium 135 - 145 mmol/L 136 - 134(L)  Potassium 3.5 - 5.1 mmol/L 3.4(L) - 3.8  Chloride 98 - 111 mmol/L 106 - 104  CO2 22 - 32 mmol/L 25 - 23  Calcium 8.9 - 10.3 mg/dL 7.7(L) - 8.4(L)  Total Protein 6.5 - 8.1 g/dL - - -  Total Bilirubin 0.3 - 1.2 mg/dL - - -  Alkaline Phos 38 - 126 U/L - - -  AST 15 - 41 U/L - - -  ALT 0 - 44 U/L - - -      Microbiology: Recent Results (from the past 240 hour(s))  Culture, blood (routine x 2)     Status: None (Preliminary result)   Collection Time: 03/22/21  4:20 PM   Specimen: BLOOD  Result Value Ref Range Status   Specimen Description BLOOD LEFT ANTECUBITAL  Final   Special Requests   Final    BOTTLES DRAWN AEROBIC AND ANAEROBIC Blood Culture results may not be optimal due to an excessive volume of blood received in culture bottles   Culture   Final    NO GROWTH 3 DAYS Performed at [facility], [address] [place], [place] [address]    Report Status PENDING  Incomplete  Culture, blood (routine x 2)     Status: None (Preliminary result)   Collection Time: 03/22/21  4:25 PM   Specimen: BLOOD  Result Value Ref Range Status   Specimen Description BLOOD BLOOD LEFT HAND  Final   Special Requests   Final    BOTTLES DRAWN AEROBIC AND ANAEROBIC Blood Culture adequate volume   Culture   Final    NO GROWTH 3 DAYS Performed at [facility], [address] [place], [place] [address]    Report Status PENDING  Incomplete  Urine culture     Status: Abnormal   Collection Time: 03/22/21  5:00 PM   Specimen: In/Out Cath Urine  Result Value Ref Range Status   Specimen Description   Final    IN/OUT CATH URINE Performed at [facility], [address] [place], [place] [address]    Special Requests   Final    NONE Performed at [facility], [address] [place], [place] [address]    Culture MULTIPLE SPECIES PRESENT, SUGGEST  RECOLLECTION (A)  Final   Report Status 03/24/2021 FINAL  Final  Surgical PCR screen     Status: Abnormal   Collection Time: 03/24/21  6:58 AM   Specimen: Nasal Mucosa; Nasal Swab  Result Value Ref Range Status   MRSA, PCR POSITIVE (A) NEGATIVE Final    Comment: RESULT CALLED TO, READ BACK BY AND VERIFIED WITH: Margarite Gouge AT 6712 03/24/21.PMF    Staphylococcus aureus POSITIVE (A) NEGATIVE Final    Comment: (NOTE) The Xpert  SA Assay (FDA approved for NASAL specimens in patients 84 years of age and older), is one component of a comprehensive surveillance program. It is not intended to diagnose infection nor to guide or monitor treatment. Performed at [facility], [address] [place], [place] [address]   Aerobic/Anaerobic Culture w Gram Stain (surgical/deep wound)     Status: None (Preliminary result)   Collection Time: 03/24/21  8:51 AM   Specimen: PATH Other; Tissue  Result Value Ref Range Status   Specimen Description   Final    WOUND LEFT ELBOW Performed at [facility], [address], [place], [place] [address]    Special Requests   Final    NONE Performed at [facility], [address] [place], [place] [address]    Gram Stain   Final    RARE WBC PRESENT, PREDOMINANTLY PMN RARE GRAM POSITIVE COCCI IN PAIRS Performed at [facility], [place] [address] [place], [place] [address]    Culture PENDING  Incomplete   Report Status PENDING  Incomplete  Aerobic/Anaerobic Culture w Gram Stain (surgical/deep wound)     Status: None (Preliminary result)   Collection Time: 03/24/21  8:52 AM   Specimen: PATH Other; Tissue  Result Value Ref Range Status   Specimen Description   Final    WOUND LEFT ELBOW DEEP Performed at [facility], [address], [place], [place] [address]    Special Requests   Final    NONE Performed at [facility], [address] [place], [place] [address]    Gram Stain   Final    NO WBC  SEEN NO ORGANISMS SEEN Performed at [facility], [place] [address] [place], [place] [address]    Culture PENDING  Incomplete   Report Status PENDING  Incomplete    IMAGING RESULTS: MRI of the elbow reviewed. Soft tissue swelling No septic joint or osteomyelitis. I have personally reviewed the films ? Impression/Recommendation Septic olecranon bursitis of the left elbow secondary to fall and infection. Surrounding cellulitis Has improved with surgery and as well as with antibiotics. Gram-positive cocci in pairs and seen in the gram stain.  Likely could be staph aureus.  Patient is currently on vancomycin and ceftriaxone.  Will discontinue ceftriaxone.  Depending on the ID and susceptibility will decide on oral antibiotics to go home.   ? __Recent CVA involving the right MCA area.    ___A. fib on Eliquis  Diabetes mellitus on Lantus  Dementia on Namenda  Hypothyroidism on Synthroid.  ______________________________________________ Discussed the management with the requesting provider and her nurse at bedside. Note:  This document was prepared using Dragon voice recognition software and may include unintentional dictation errors.

## 2021-03-26 ENCOUNTER — Other Ambulatory Visit: Payer: Self-pay | Admitting: Infectious Diseases

## 2021-03-26 DIAGNOSIS — A4902 Methicillin resistant Staphylococcus aureus infection, unspecified site: Secondary | ICD-10-CM

## 2021-03-26 LAB — GLUCOSE, CAPILLARY
Glucose-Capillary: 86 mg/dL (ref 70–99)
Glucose-Capillary: 112 mg/dL — ABNORMAL HIGH (ref 70–99)
Glucose-Capillary: 213 mg/dL — ABNORMAL HIGH (ref 70–99)
Glucose-Capillary: 193 mg/dL — ABNORMAL HIGH (ref 70–99)
Glucose-Capillary: 223 mg/dL — ABNORMAL HIGH (ref 70–99)
Glucose-Capillary: 95 mg/dL (ref 70–99)

## 2021-03-26 MED ORDER — SULFAMETHOXAZOLE-TRIMETHOPRIM 800-160 MG PO TABS
1.0000 | ORAL_TABLET | Freq: Two times a day (BID) | ORAL | Status: DC
  Administered 2021-03-27: 1 via ORAL
  Filled 2021-03-26 (×2): qty 1

## 2021-03-26 NOTE — Consult Note (Signed)
Pharmacy Antibiotic Note  Michelle Vang is a 87 y.o. female admitted on 03/22/2021 with cellulitis.  Pharmacy has been consulted for vancomycin dosing.  Plan: ID consulted and following with plan to determine PO regimen based on susceptibilities of wound cultures. Current cultures MRSA positive w/ Vanc MIC of 1. Continue current regimen & f/u further ID rec's. Vancomycin to 1000 mg IV q36h Goal AUC 400-550 Est AUC: 506 Calculated using SCr 1.03 and Vd coefficient of 0.5  Temp (24hrs), Avg:98.1 F (36.7 C), Min:97.8 F (36.6 C), Max:98.8 F (37.1 C)  Recent Labs  Lab 03/22/21 1600 03/22/21 1604 03/22/21 1913 03/23/21 0630 03/23/21 1020 03/24/21 0439 03/25/21 0609  WBC 17.8*  --   --  17.8*  --  14.8* 10.9*  CREATININE 1.23*  --   --  1.03*  --  1.02* 1.03*  LATICACIDVEN  --  2.7* 2.7*  --  2.8*  --  1.2     Estimated Creatinine Clearance: 38.6 mL/min (A) (by C-G formula based on SCr of 1.03 mg/dL (H)).    Allergies  Allergen Reactions   Aricept [Donepezil Hcl] Diarrhea    Antimicrobials this admission: 7/8 metronidazole x1 in ED 7/8 cefepime x1 in ED 7/8 vancomycin >> Ceftriaxone (7/9-11)  Dose adjustments this admission: 7/9 vancomycin 500 mg q24h >> 1000 mg q36h   Microbiology results: 7/08 Bcx: NGTD 7/10 superficial wound cx:  GPC in pairs 7/10 deep wound cx:  few Staph aureus (Vanc MIC 1) 7/10 MRSA PCR positive   Thank you for allowing pharmacy to be a part of this patient's care.  Lorna Dibble, PharmD 03/26/2021 2:37 PM

## 2021-03-26 NOTE — Progress Notes (Signed)
  ID: Michelle Vang is a 87 y.o. female  Active Problems:   Hypertension associated with diabetes ([place])   Hypothyroidism   Insulin dependent type 2 diabetes mellitus ([place])   Mild cognitive impairment with memory loss   Persistent atrial fibrillation (HCC)   History of CVA (cerebrovascular accident)   CKD (chronic kidney disease), stage III (HCC)    Subjective Daughter at bed side Pt is very pleasantly confused but having a good conversation No complaints  Medications:   acetaminophen  1,000 mg Oral Q8H   apixaban  5 mg Oral BID   vitamin C  250 mg Oral BID   atorvastatin  10 mg Oral Daily   busPIRone  5 mg Oral BID   Chlorhexidine Gluconate Cloth  6 each Topical Q0600   feeding supplement (GLUCERNA SHAKE)  237 mL Oral TID BM   insulin aspart  0-15 Units Subcutaneous Q4H   insulin glargine  15 Units Subcutaneous Daily   levothyroxine  88 mcg Oral QAC breakfast   memantine  10 mg Oral BID   metoprolol succinate  25 mg Oral Daily   multivitamin with minerals  1 tablet Oral Daily   mupirocin ointment  1 application Nasal BID   sodium chloride flush  3 mL Intravenous Q12H    Objective: Vital signs in last 24 hours: Temp:  [97.8 F (36.6 C)-98.8 F (37.1 C)] 97.9 F (36.6 C) (07/12 1156) Pulse Rate:  [95-103] 103 (07/12 1156) Resp:  [16-18] 16 (07/12 0435) BP: (124-150)/(68-80) 139/75 (07/12 1156) SpO2:  [94 %-99 %] 99 % (07/12 1156)  PHYSICAL EXAM:  General: Alert, cooperative, no distress, appears young for age. happy Head: Normocephalic, without obvious abnormality, atraumatic. Eyes: Conjunctivae clear, anicteric sclerae. Pupils are equal ENT Nares normal. No drainage or sinus tenderness. Lips, mucosa, and tongue normal. No Thrush Neck: Supple, symmetrical, no adenopathy, thyroid: non tender no carotid bruit and no JVD. Back: No CVA tenderness. Lungs: Clear to auscultation bilaterally. No Wheezing or Rhonchi. No rales. Heart: Regular rate and rhythm, no murmur,  rub or gallop. Abdomen: did not examine Extremities:left forearm swollen- elbow has wound vac Skin: No rashes or lesions. Or bruising Lymph: Cervical, supraclavicular normal. Neurologic: Grossly non-focal  Lab Results Recent Labs    03/24/21 0439 03/25/21 0609  WBC 14.8* 10.9*  HGB 12.2 11.5*  HCT 34.9* 33.7*  NA  --  136  K  --  3.4*  CL  --  106  CO2  --  25  BUN  --  17  CREATININE 1.02* 1.03*    Microbiology: WC -MRSA     Assessment/Plan: Septic olecranon bursitis duet o MRSA following a fall S/p debridement- on vancomycin IV- will change to bactrim DS I BID for 2 weeks Discussed with daughter- will monitor Cr, K at home - will get labs in a week- if increasing will switch to Doxycycline  [place] applied to the arm to reduce the swelling  H/o CVA involving rt MCA  Afib on eliquis  DM on lantus  Dementia on Namenda  Discussed the management with the patient and her daughter and care team Will follow her as OP

## 2021-03-26 NOTE — Progress Notes (Signed)
  Subjective: 2 Days Post-Op Procedure(s) (LRB): IRRIGATION AND DEBRIDEMENT EXTREMITY (Left) APPLICATION OF PREVENA WOUND VAC (Left) Patient reports pain as mild.   Patient is well, and has had no acute complaints or problems Plan is to go Home after hospital stay. Negative for chest pain and shortness of breath Fever: no Gastrointestinal:negative for nausea and vomiting  Objective: Vital signs in last 24 hours: Temp:  [97.5 F (36.4 C)-98.8 F (37.1 C)] 97.8 F (36.6 C) (07/12 0435) Pulse Rate:  [74-102] 96 (07/12 0435) Resp:  [16-19] 16 (07/12 0435) BP: (106-150)/(62-80) 124/80 (07/12 0435) SpO2:  [94 %-100 %] 99 % (07/12 0435)  Intake/Output from previous day:  Intake/Output Summary (Last 24 hours) at 03/26/2021 0559 Last data filed at 03/26/2021 0436 Gross per 24 hour  Intake 320 ml  Output 3 ml  Net 317 ml    Intake/Output this shift: Total I/O In: 320 [P.O.:120; IV Piggyback:200] Out: 3 [Urine:3]  Labs: Recent Labs    03/23/21 0630 03/24/21 0439 03/25/21 0609  HGB 12.9 12.2 11.5*   Recent Labs    03/24/21 0439 03/25/21 0609  WBC 14.8* 10.9*  RBC 3.77* 3.46*  HCT 34.9* 33.7*  PLT 225 205   Recent Labs    03/23/21 0630 03/24/21 0439 03/25/21 0609  NA 134*  --  136  K 3.8  --  3.4*  CL 104  --  106  CO2 23  --  25  BUN 22  --  17  CREATININE 1.03* 1.02* 1.03*  GLUCOSE 230*  --  146*  CALCIUM 8.4*  --  7.7*   No results for input(s): LABPT, INR in the last 72 hours.   EXAM General - Patient is Alert and Oriented Extremity - Neurovascular intact Sensation intact distally Moving elbow comfortably.  Normal grip.  Register working well. Dressing/Incision - clean, dry, with the woundvac suctioning Motor Function - intact, moving elbows and wrist well on exam.   Past Medical History:  Diagnosis Date   Bladder cancer ([place])    Diabetes mellitus without complication ([place])    GERD (gastroesophageal reflux disease)    Hypertension    Lung  cancer ([place])    Thyroid disease     Assessment/Plan: 2 Days Post-Op Procedure(s) (LRB): IRRIGATION AND DEBRIDEMENT EXTREMITY (Left) APPLICATION OF PREVENA WOUND VAC (Left) Active Problems:   Hypertension associated with diabetes (HCC)   Hypothyroidism   Insulin dependent type 2 diabetes mellitus (HCC)   Mild cognitive impairment with memory loss   Persistent atrial fibrillation (HCC)   History of CVA (cerebrovascular accident)   CKD (chronic kidney disease), stage III ([place])  Estimated body mass index is 35.07 kg/m as calculated from the following:   Height as of 03/19/21: 5\' 3"  (1.6 m).   Weight as of 03/19/21: 89.8 kg. Advance diet Up with therapy D/C IV fluids  Keep Pravena in place. Can remove when battery dies in ~5-7 days. Can cover with dry, sterile dressing afterwards. F/u cultures. Can discharge on appropriate PO Abx PT/OT while inpatient. Avoid repetitive/excessive elbow flexion to help with wound healing. No other major restrictions with daily activity F/u in ~2 weeks at [facility] for suture removal. Please contact me with any questions.  DVT Prophylaxis -  eliquis   Reche Dixon, PA-C Orthopaedic Surgery 03/26/2021, 5:59 AM

## 2021-03-26 NOTE — Progress Notes (Signed)
PROGRESS NOTE    Michelle Vang  FGH:[id number] DOB: May 05, 1929 DOA: 03/22/2021 PCP: Jearld Fenton, NP    Brief Narrative:  Michelle Vang is a 87 y.o. female with medical history significant for recent right MCA territory stroke, persistent atrial fibrillation on Eliquis, CKD stage III, insulin-dependent type 2 diabetes, HTN, HLD, dysphagia, history of lung and bladder cancer, hypothyroidism, hard of hearing, and mild dementia who presented to the ED for evaluation of left-sided weakness.  History is supplemented by daughter at bedside.  Daughter this a.m. tells me she had generalized weakness.  She was recently admitted for stroke.  Neurology was consulted  Patient fell missing a step a week ago but did not tell daughter and complained of left arm pain.found with Lt arm infection.   She is Status post I&D today with application of Prevena wound VAC of left elbow  7/12-pain control. No issues. +bm   Consultants:  Neurology, Ortho, ID  Procedures:   Antimicrobials:  Ceftriaxone  Metronidazole x1 Vancomycin  Subjective: No sob. No cp  Objective: Vitals:   03/25/21 1944 03/26/21 0435 03/26/21 0816 03/26/21 1156  BP: (!) 150/80 124/80 (!) 147/68 139/75  Pulse: (!) 102 96 95 (!) 103  Resp: 18 16    Temp: 98.8 F (37.1 C) 97.8 F (36.6 C) 97.8 F (36.6 C) 97.9 F (36.6 C)  TempSrc:      SpO2: 94% 99% 99% 99%    Intake/Output Summary (Last 24 hours) at 03/26/2021 1610 Last data filed at 03/26/2021 0600 Gross per 24 hour  Intake 440 ml  Output 3 ml  Net 437 ml   There were no vitals filed for this visit.  Examination: NAD, calm CTA no wheeze rales or rhonchi irrgS1-S2 no gallops Soft benign positive bowel sounds Left upper extremity swollen, mildly erythematous wound VAC in place No edema of the lower extremity Awake and alert and oriented, grossly intact    Data Reviewed: I have personally reviewed following labs and imaging studies  CBC: Recent Labs  Lab  03/22/21 1600 03/23/21 0630 03/24/21 0439 03/25/21 0609  WBC 17.8* 17.8* 14.8* 10.9*  NEUTROABS 15.7*  --   --   --   HGB 14.6 12.9 12.2 11.5*  HCT 41.9 35.7* 34.9* 33.7*  MCV 93.7 95.2 92.6 97.4  PLT 281 235 225 678   Basic Metabolic Panel: Recent Labs  Lab 03/22/21 1600 03/23/21 0630 03/24/21 0439 03/25/21 0609  NA 129* 134*  --  136  K 4.2 3.8  --  3.4*  CL 99 104  --  106  CO2 20* 23  --  25  GLUCOSE 411* 230*  --  146*  BUN 26* 22  --  17  CREATININE 1.23* 1.03* 1.02* 1.03*  CALCIUM 8.9 8.4*  --  7.7*   GFR: Estimated Creatinine Clearance: 38.6 mL/min (A) (by C-G formula based on SCr of 1.03 mg/dL (H)). Liver Function Tests: Recent Labs  Lab 03/22/21 1600  AST 44*  ALT 31  ALKPHOS 144*  BILITOT 1.8*  PROT 7.3  ALBUMIN 3.4*   No results for input(s): LIPASE, AMYLASE in the last 168 hours. No results for input(s): AMMONIA in the last 168 hours. Coagulation Profile: Recent Labs  Lab 03/22/21 1600  INR 1.5*   Cardiac Enzymes: No results for input(s): CKTOTAL, CKMB, CKMBINDEX, TROPONINI in the last 168 hours. BNP (last 3 results) No results for input(s): PROBNP in the last 8760 hours. HbA1C: No results for input(s): HGBA1C in the last  72 hours. CBG: Recent Labs  Lab 03/25/21 2003 03/25/21 2334 03/26/21 0431 03/26/21 0829 03/26/21 1157  GLUCAP 186* 173* 86 112* 213*   Lipid Profile: No results for input(s): CHOL, HDL, LDLCALC, TRIG, CHOLHDL, LDLDIRECT in the last 72 hours. Thyroid Function Tests: No results for input(s): TSH, T4TOTAL, FREET4, T3FREE, THYROIDAB in the last 72 hours. Anemia Panel: No results for input(s): VITAMINB12, FOLATE, FERRITIN, TIBC, IRON, RETICCTPCT in the last 72 hours. Sepsis Labs: Recent Labs  Lab 03/22/21 1604 03/22/21 1913 03/23/21 0630 03/23/21 1020 03/25/21 0609  PROCALCITON  --   --  0.80  --   --   LATICACIDVEN 2.7* 2.7*  --  2.8* 1.2    Recent Results (from the past 240 hour(s))  Culture, blood  (routine x 2)     Status: None (Preliminary result)   Collection Time: 03/22/21  4:20 PM   Specimen: BLOOD  Result Value Ref Range Status   Specimen Description BLOOD LEFT ANTECUBITAL  Final   Special Requests   Final    BOTTLES DRAWN AEROBIC AND ANAEROBIC Blood Culture results may not be optimal due to an excessive volume of blood received in culture bottles   Culture   Final    NO GROWTH 4 DAYS Performed at [facility], [address] [place], [place] [address]    Report Status PENDING  Incomplete  Culture, blood (routine x 2)     Status: None (Preliminary result)   Collection Time: 03/22/21  4:25 PM   Specimen: BLOOD  Result Value Ref Range Status   Specimen Description BLOOD BLOOD LEFT HAND  Final   Special Requests   Final    BOTTLES DRAWN AEROBIC AND ANAEROBIC Blood Culture adequate volume   Culture   Final    NO GROWTH 4 DAYS Performed at [facility], [address] [place], [place] [address]    Report Status PENDING  Incomplete  Urine culture     Status: Abnormal   Collection Time: 03/22/21  5:00 PM   Specimen: In/Out Cath Urine  Result Value Ref Range Status   Specimen Description   Final    IN/OUT CATH URINE Performed at [facility], [address], [place], [place] [address]    Special Requests   Final    NONE Performed at [facility], [address] [place], [place] [address]    Culture MULTIPLE SPECIES PRESENT, SUGGEST RECOLLECTION (A)  Final   Report Status 03/24/2021 FINAL  Final  Surgical PCR screen     Status: Abnormal   Collection Time: 03/24/21  6:58 AM   Specimen: Nasal Mucosa; Nasal Swab  Result Value Ref Range Status   MRSA, PCR POSITIVE (A) NEGATIVE Final    Comment: RESULT CALLED TO, READ BACK BY AND VERIFIED WITH: Michelle Vang AT 6237 03/24/21.PMF    Staphylococcus aureus POSITIVE (A) NEGATIVE Final    Comment: (NOTE) The Xpert SA Assay (FDA approved for NASAL specimens in patients 50 years of age  and older), is one component of a comprehensive surveillance program. It is not intended to diagnose infection nor to guide or monitor treatment. Performed at [facility], [place], [place] [address]   Aerobic/Anaerobic Culture w Gram Stain (surgical/deep wound)     Status: None (Preliminary result)   Collection Time: 03/24/21  8:51 AM   Specimen: PATH Other; Tissue  Result Value Ref Range Status   Specimen Description   Final    WOUND LEFT ELBOW Performed  at [facility], [address], [place], [place] [address]    Special Requests   Final    NONE Performed at [facility], [address] [place], [place] [address]    Gram Stain   Final    RARE WBC PRESENT, PREDOMINANTLY PMN RARE GRAM POSITIVE COCCI IN PAIRS Performed at [facility], [place] [address] [place], [place] [address]    Culture   Final    ABUNDANT STAPHYLOCOCCUS AUREUS SUSCEPTIBILITIES PERFORMED ON PREVIOUS CULTURE WITHIN THE LAST 5 DAYS. NO ANAEROBES ISOLATED; CULTURE IN PROGRESS FOR 5 DAYS    Report Status PENDING  Incomplete  Aerobic/Anaerobic Culture w Gram Stain (surgical/deep wound)     Status: None (Preliminary result)   Collection Time: 03/24/21  8:52 AM   Specimen: PATH Other; Tissue  Result Value Ref Range Status   Specimen Description   Final    WOUND LEFT ELBOW DEEP Performed at [facility], [address], [place], [place] [address]    Special Requests   Final    NONE Performed at [facility], [address] [place], [place] [address]    Gram Stain   Final    NO WBC SEEN NO ORGANISMS SEEN Performed at [facility], [place] [address], [place], [place] [address]    Culture   Final    FEW METHICILLIN RESISTANT STAPHYLOCOCCUS AUREUS NO ANAEROBES ISOLATED; CULTURE IN PROGRESS FOR 5 DAYS    Report Status PENDING  Incomplete   Organism ID, Bacteria METHICILLIN RESISTANT STAPHYLOCOCCUS AUREUS  Final      Susceptibility    Methicillin resistant staphylococcus aureus - MIC*    CIPROFLOXACIN >=8 RESISTANT Resistant     ERYTHROMYCIN >=8 RESISTANT Resistant     GENTAMICIN <=0.5 SENSITIVE Sensitive     OXACILLIN >=4 RESISTANT Resistant     TETRACYCLINE <=1 SENSITIVE Sensitive     VANCOMYCIN 1 SENSITIVE Sensitive     TRIMETH/SULFA <=10 SENSITIVE Sensitive     CLINDAMYCIN >=8 RESISTANT Resistant     RIFAMPIN <=0.5 SENSITIVE Sensitive     Inducible Clindamycin NEGATIVE Sensitive     * FEW METHICILLIN RESISTANT STAPHYLOCOCCUS AUREUS         Radiology Studies: No results found.      Scheduled Meds:  acetaminophen  1,000 mg Oral Q8H   apixaban  5 mg Oral BID   vitamin C  250 mg Oral BID   atorvastatin  10 mg Oral Daily   busPIRone  5 mg Oral BID   Chlorhexidine Gluconate Cloth  6 each Topical Q0600   feeding supplement (GLUCERNA SHAKE)  237 mL Oral TID BM   insulin aspart  0-15 Units Subcutaneous Q4H   insulin glargine  15 Units Subcutaneous Daily   levothyroxine  88 mcg Oral QAC breakfast   memantine  10 mg Oral BID   metoprolol succinate  25 mg Oral Daily   multivitamin with minerals  1 tablet Oral Daily   mupirocin ointment  1 application Nasal BID   sodium chloride flush  3 mL Intravenous Q12H   Continuous Infusions:  vancomycin 1,000 mg (03/25/21 2009)    Assessment & Plan:   Active Problems:   Hypertension associated with diabetes ([place])   Hypothyroidism   Insulin dependent type 2 diabetes mellitus ([place])   Mild cognitive impairment with memory loss   Persistent atrial fibrillation (HCC)   History of CVA (cerebrovascular accident)   CKD (chronic kidney disease), stage III ([place])   Michelle  LYNNETTA Vang is a 87 y.o. female with medical history significant for recent right MCA territory stroke, persistent atrial fibrillation on Eliquis, CKD stage III, insulin-dependent type 2 diabetes, HTN, HLD, dysphagia, history of lung and bladder cancer, hypothyroidism, and mild dementia who is admitted  with severe sepsis.   Severe sepsis suspected due to left elbow wound and septic olecranon bursitis Left elbow wound is presumed source of infection.  Wound care consulted  Orthopedics was consulted due to fluctuance around her wound, concern for possible abscess, input was appreciated MRI negative for osteomyelitis or septic joint.  Concern for phlegmon or septic bursitis  Status post I&D of left elbow, left elbow olecranon bursectomy on 7/10 by Dr. Posey Pronto Wound cx done Lactic acid improved 7/12 ID was consulted input was appreciated.  When patient is ready to go home can do Bactrim DS 1 mg twice daily for 2 weeks Continue vancomycin until discharge Leukocytosis improving. Will need to have renal function monitored while on bactrim Per Ortho keep Prevena in place.  Can remove once battery dies in 5 to 7 days Can cover with dry, sterile dressing afterwards Avoid repetitive/excessive elbow flexion to keep with wound healing.  No other major restrictions with daily activity Follow-up 2 weeks at [facility] clinic for suture removal.    recent right MCA stroke with recurrent left-sided weakness: Initially presented as a code stroke due to recurrent left-sided weakness.  Seen by neurology who felt CT head and exam consistent with prior stroke, no further neurologic work-up needed at this time.  Suspect recrudescence of prior stroke symptoms in setting of sepsis.  Weakness appears to have resolved with 5/5 strength throughout the extremities at time of my exam. 7/12 neurology signed off Continue Eliquis     Hyperglycemia in setting of insulin-dependent type 2 diabetes: Worsened in setting of infection.  No sign of DKA/HHS.   7/12 BG stable Continue Lantus and R-ISS      Persistent atrial fibrillation: Remains in A. fib with controlled rates  7/12 continue beta-blockers and anticoagulation   Hypokalemia-  Will replace Ck am level    Hypertension: Continue Toprol-XL.   CKD stage  III: Stable, continue monitor.   Hypothyroidism: Continue Synthroid.   Mild dementia: Appears to be at baseline.  Continue Namenda and BuSpar.     DVT prophylaxis: scd, eliquis Code Status: DNR Family Communication:sitter was updated this am.  Disposition Plan:  Status is: Inpatient  Remains inpatient appropriate because:Inpatient level of care appropriate due to severity of illness  Dispo: The patient is from: Home              Anticipated d/c is to: Home              Patient currently is not medically stable to d/c.   Difficult to place patient No    Continue iv abx today. Family needs to prepare for discharge and have sitter at house in am.  Can likely dc in am if clinically stable.         LOS: 4 days   Time spent: 35 minutes with more than 50% on [place], MD [facility] Pager [phone]  If 7PM-7AM, please contact night-coverage 03/26/2021, 4:10 PM

## 2021-03-26 NOTE — TOC Transition Note (Addendum)
Transition of Care [facility]) - CM/SW Discharge Note   Patient Details  Name: Michelle Vang MRN: [id number] Date of Birth: Aug 20, 1929  [facility] of [facility] CM/SW Contact:  Alberteen Sam, LCSW Phone Number: 03/26/2021, 3:44 PM   Clinical Narrative:     Update: CSW spoke with patient's daughter Margherita regarding dc , she reports she will have the sitter set up in home tomorrow prepared for patient's return. MD and RN made aware, plan to dc home tomorrow.        Patient is already set up with [facility] home health services for PT, OT, RN and aide. Clarise Cruz with [facility] informed of potential for discharge today, and will aide in wound care.   If patient should discharge today, will need [facility]. Forms printed to chart and RN made aware of [facility] information if patient should discharge after hours.   Final next level of care: [place] Barriers to Discharge: Continued Medical Work up   Patient Goals and CMS Choice Patient states their goals for this hospitalization and ongoing recovery are:: to go home CMS Medicare.gov Compare Post Acute Care list provided to:: Patient Choice offered to / list presented to : Patient  Discharge Placement                       Discharge Plan and Services     Post Acute Care Choice: Home Health                    HH Arranged: PT, OT, RN, Nurse's Aide [facility] Agency: [facility] Date [facility] Agency Contacted: 03/26/21 Time [place]: 1544 Representative spoke with at [place]: [place] ([place]) Interventions     Readmission Risk Interventions No flowsheet data found.

## 2021-03-27 ENCOUNTER — Other Ambulatory Visit: Payer: Self-pay | Admitting: Internal Medicine

## 2021-03-27 DIAGNOSIS — I4819 Other persistent atrial fibrillation: Secondary | ICD-10-CM

## 2021-03-27 LAB — CULTURE, BLOOD (ROUTINE X 2)
Culture: NO GROWTH
Culture: NO GROWTH
Special Requests: ADEQUATE

## 2021-03-27 LAB — CBC
HCT: 36.7 % (ref 36.0–46.0)
Hemoglobin: 12.5 g/dL (ref 12.0–15.0)
MCH: 32.4 pg (ref 26.0–34.0)
MCHC: 34.1 g/dL (ref 30.0–36.0)
MCV: 95.1 fL (ref 80.0–100.0)
Platelets: 280 10*3/uL (ref 150–400)
RBC: 3.86 MIL/uL — ABNORMAL LOW (ref 3.87–5.11)
RDW: 12.9 % (ref 11.5–15.5)
WBC: 9.3 10*3/uL (ref 4.0–10.5)
nRBC: 0 % (ref 0.0–0.2)

## 2021-03-27 LAB — BASIC METABOLIC PANEL
Anion gap: 10 (ref 5–15)
BUN: 16 mg/dL (ref 8–23)
CO2: 22 mmol/L (ref 22–32)
Calcium: 8.6 mg/dL — ABNORMAL LOW (ref 8.9–10.3)
Chloride: 107 mmol/L (ref 98–111)
Creatinine, Ser: 0.88 mg/dL (ref 0.44–1.00)
GFR, Estimated: >60 mL/min (ref >60)
Glucose, Bld: 117 mg/dL — ABNORMAL HIGH (ref 70–99)
Potassium: 4 mmol/L (ref 3.5–5.1)
Sodium: 139 mmol/L (ref 135–145)

## 2021-03-27 LAB — GLUCOSE, CAPILLARY
Glucose-Capillary: 122 mg/dL — ABNORMAL HIGH (ref 70–99)
Glucose-Capillary: 105 mg/dL — ABNORMAL HIGH (ref 70–99)
Glucose-Capillary: 187 mg/dL — ABNORMAL HIGH (ref 70–99)

## 2021-03-27 MED ORDER — SULFAMETHOXAZOLE-TRIMETHOPRIM 800-160 MG PO TABS
1.0000 | ORAL_TABLET | Freq: Two times a day (BID) | ORAL | 0 refills | Status: DC
Start: 2021-03-28 — End: 2021-04-11

## 2021-03-27 MED ORDER — SULFAMETHOXAZOLE-TRIMETHOPRIM 800-160 MG PO TABS: 1.0000 | ORAL_TABLET | Freq: Two times a day (BID) | ORAL | Status: DC

## 2021-03-27 NOTE — Progress Notes (Signed)
Patient alert and pleasantly confused. She has no complaints of pain. Wound vac intact to left elbow. No change in previous assessment. Patient discharging home. Discharge instructions went over with sitter in room, [place].

## 2021-03-27 NOTE — Discharge Summary (Signed)
Physician Discharge Summary  [name] TKP:[id number] DOB: [birth year] DOA: 03/22/2021  PCP: Jearld Fenton, NP  Admit date: 03/22/2021 Discharge date: 03/27/2021  Admitted From: home  Disposition:  home w/ home health   Recommendations for Outpatient Follow-up:  Follow up with PCP in 1-2 weeks F/u w/ ortho surg, Dr. Leim Fabry, in 2 weeks F/u w/ ID, Dr. Delaine Lame, in 1 week Needs BMP in 1 week to check Cr & K level   Home Health: yes Equipment/Devices:   Discharge Condition: stable CODE STATUS:  Diet recommendation: Heart Healthy / Carb Modified   Brief/Interim Summary: HPI was taken from Dr. Zada Finders:  Michelle Vang is a 87 y.o. female with medical history significant for recent right MCA territory stroke, persistent atrial fibrillation on Eliquis, CKD stage III, insulin-dependent type 2 diabetes, HTN, HLD, dysphagia, history of lung and bladder cancer, hypothyroidism, hard of hearing, and mild dementia who presented to the ED for evaluation of left-sided weakness.  History is supplemented by daughter at bedside.  Patient was recently admitted 02/07/2021-02/14/2021 for left-sided weakness due to acute right MCA territory stroke.  She was started on Eliquis due to recent diagnosis of atrial fibrillation.  She was ultimately discharged to home with home health services.   Daughter states that patient lives at home and has 24-hour care from family members.  She normally ambulates with the use of a Rollator.  She developed a recent wound to her left elbow after an unspecified injury.  She was taken to her PCP for evaluation.  Afterwards she was noted to have significant lethargy and left-sided weakness which prompted family to bring her back to the ED due to concern for recurrent stroke. Patient otherwise states that she feels fine and has no pain or specific complaints.   ED Course:  Initial vitals showed BP 146/90, pulse 123, RR 16, temp 102.1 F, SPO2 95% on room air.   Labs  show WBC 17.8, hemoglobin 14.6, platelets 281,000, serum glucose 411, sodium 129 (136 when corrected for hyperglycemia), potassium 4.2, bicarb 20, BUN 26, creatinine 1.23, AST 44, ALT 31, alk phos 144, total bilirubin 1.8, lactic acid 2.7.   Blood cultures obtained and pending.  Urinalysis and urine culture ordered and pending collection.   CT head showed evolution of previously seen infarct in the right MCA territory with developing encephalomalacia in the right insula and basal ganglia.   Portable chest x-ray negative for focal consolidation, edema, or effusion.   Patient seen by neurology on arrival and felt symptoms were likely recrudescence of prior stroke in the setting of sepsis.  Patient was given 2 L LR, IV vancomycin, cefepime, Flagyl.  The hospitalist service was consulted to admit for further evaluation and management.  As per Dr. Kurtis Bushman: Michelle Vang is a 87 y.o. female with medical history significant for recent right MCA territory stroke, persistent atrial fibrillation on Eliquis, CKD stage III, insulin-dependent type 2 diabetes, HTN, HLD, dysphagia, history of lung and bladder cancer, hypothyroidism, hard of hearing, and mild dementia who presented to the ED for evaluation of left-sided weakness.  History is supplemented by daughter at bedside.  Daughter this a.m. tells me she had generalized weakness.  She was recently admitted for stroke.  Neurology was consulted Patient fell missing a step a week ago but did not tell daughter and complained of left arm pain.found with Lt arm infection.   She is Status post I&D today with application of Prevena wound VAC of left elbow  7/12-pain control. No issues. +bm   Hospital course from Dr. Jimmye Norman 03/27/21: Pt was stable for d/c. Pt was sent home w/ home health & w/ po bactrim x 2 weeks as per ID. Pt will f/u w/ ortho surg, ID & PCP.   Discharge Diagnoses:  Active Problems:   Hypertension associated with diabetes ([place])    Hypothyroidism   Insulin dependent type 2 diabetes mellitus (HCC)   Mild cognitive impairment with memory loss   Persistent atrial fibrillation (HCC)   History of CVA (cerebrovascular accident)   CKD (chronic kidney disease), stage III (HCC)  Severe sepsis: met criteria w/ fever, tachycardia, leukocytosis, elevated lactic acid and suspected due to left elbow wound and septic olecranon bursitis. Severe sepsis resolved. S/p I&D of left elbow, left elbow olecranon bursectomy on 7/10 by Dr. Posey Pronto. D/c on bactrim DS BID for 2 weeks as per ID. Per Ortho keep Prevena in place & can remove once battery dies in 5 to 7 days. Avoid repetitive/excessive elbow flexion to keep with wound healing.  No other major restrictions with daily activity. Follow-up 2 weeks at [facility] for suture removal.   Recent right MCA CVA: w/ recurrent left-sided weakness. Seen by neurology who felt CT head and exam consistent with prior stroke, no further neurologic work-up needed at this time.    DM2: likely poorly controlled. Continue on lantus, SSI w/ accuchecks  Persistent atrial fibrillation: continue on metoprolol, eliquis   Hypokalemia: WNL today    HTN: continue on metoprolol   CKDIIIb: Cr is trending down from day prior. GFR is >60 today   Hypothyroidism: continue on home dose of synthroid   Mild dementia: continue on home dose of namenda, buspar   Discharge Instructions  Discharge Instructions     Diet - low sodium heart healthy   Complete by: As directed    Discharge instructions   Complete by: As directed    F/u w/ ortho sug, Dr. Leim Fabry, in 2 weeks. F/u w/ ID, Dr. Delaine Lame, in 1 week. F/u w/ PCP in 1-2 weeks. Will need get BMP to check on Cr & K level as per ID. Keep Pravena in place. Can remove when battery dies in ~5-7 days. Can cover with dry, sterile dressing afterwards. Avoid repetitive/excessive elbow flexion to help with wound healing. No other major restrictions with daily activity.    Discharge wound care:   Complete by: As directed    Keep Pravena in place. Can remove when battery dies in ~5-7 days. Can cover with dry, sterile dressing afterwards.   Increase activity slowly   Complete by: As directed       Allergies as of 03/27/2021       Reactions   Aricept [donepezil Hcl] Diarrhea        Medication List     TAKE these medications    apixaban 2.5 MG Tabs tablet Commonly known as: ELIQUIS Take 2 tablets (5 mg total) by mouth 2 (two) times daily.   atorvastatin 10 MG tablet Commonly known as: LIPITOR TAKE 1 TABLET BY MOUTH ONCE A DAY   busPIRone 5 MG tablet Commonly known as: BUSPAR Take 1 tablet (5 mg total) by mouth 2 (two) times daily.   Fish Oil 1000 MG Caps Take 1 capsule by mouth daily.   memantine 10 MG tablet Commonly known as: NAMENDA TAKE 1 TABLET BY MOUTH TWICE A DAY   metoprolol succinate 25 MG 24 hr tablet Commonly known as: TOPROL-XL TAKE 1 TABLET BY MOUTH  ONCE A DAY   onetouch ultrasoft lancets Use as instructed   sulfamethoxazole-trimethoprim 800-160 MG tablet Commonly known as: BACTRIM DS Take 1 tablet by mouth every 12 (twelve) hours for 14 days. Start taking on: March 28, 2021   True Focus Blood Glucose Meter Devi 1 Device by Does not apply route daily.   True Focus Blood Glucose Strip test strip Generic drug: glucose blood Use as instructed   Vitamin D3 25 MCG (1000 UT) Caps Take 1 capsule by mouth daily. Reported on 11/15/2015       ASK your doctor about these medications    fluocinonide 0.05 % external solution Commonly known as: LIDEX Apply 1 application topically as needed.   insulin glargine 100 UNIT/ML injection Commonly known as: LANTUS Inject 0.07 mLs (7 Units total) into the skin daily.   levothyroxine 88 MCG tablet Commonly known as: SYNTHROID TAKE 1 TAB BY MOUTH ONCE DAILY. TAKE ON AN EMPTY STOMACH WITH A GLASS OF WATER ATLEAST 30-60 MINUTES BEFORE BREAKFAST               Discharge  Care Instructions  (From admission, onward)           Start     Ordered   03/27/21 0000  Discharge wound care:       Comments: Keep Pravena in place. Can remove when battery dies in ~5-7 days. Can cover with dry, sterile dressing afterwards.   03/27/21 1200            Follow-up Information     Leim Fabry, MD Follow up in 2 week(s).   Specialty: Orthopedic Surgery Why: For suture removal Contact information: [place] [address] [phone]         Tsosie Billing, MD Follow up in 1 week(s).   Specialty: Infectious Diseases Contact information: [place] [address] [phone]                Allergies  Allergen Reactions   Aricept [Donepezil Hcl] Diarrhea    Consultations: Ortho surg ID   Procedures/Studies: DG ELBOW COMPLETE LEFT (3+VIEW)  Result Date: 03/22/2021 CLINICAL DATA:  Recent fall with left elbow pain, initial encounter EXAM: LEFT ELBOW - COMPLETE 3+ VIEW COMPARISON:  None. FINDINGS: No acute fracture or dislocation is noted. No joint effusion is seen. Diffuse of tissue swelling is noted posteriorly consistent with the recent injury. No other focal abnormality is seen. IMPRESSION: Soft tissue swelling without acute bony abnormality. Electronically Signed   By: Inez Catalina M.D.   On: 03/22/2021 20:39   MR ELBOW LEFT WO CONTRAST  Result Date: 03/24/2021 CLINICAL DATA:  Open wound on the left elbow with pain, erythema and drainage. EXAM: MRI OF THE LEFT ELBOW WITHOUT CONTRAST TECHNIQUE: Multiplanar, multisequence MR imaging of the left elbow was performed. No intravenous contrast was administered. COMPARISON:  Plain films left elbow 03/22/2021. FINDINGS: Bones/Joint/Cartilage The exam is somewhat degraded by patient motion. No marrow edema to suggest osteomyelitis is seen. No elbow joint effusion. Ligaments Intact. Muscles and Tendons Intact.  No intramuscular fluid collection or edema. Soft  tissues There is intense subcutaneous edema about the elbow. A skin wound is seen posterior to the olecranon. There is heterogeneous fluid in the olecranon bursa measuring approximately 2.2 cm transverse by 1.7 cm AP by 1.8 cm craniocaudal worrisome for septic bursitis. IMPRESSION: Skin wound over the olecranon with heterogeneous fluid in the olecranon bursa worrisome for phlegmon or septic bursitis. Negative for osteomyelitis  or septic joint. Intense edema in subcutaneous tissues about the elbow compatible with dependent change or cellulitis. Electronically Signed   By: Inge Rise M.D.   On: 03/24/2021 09:25   DG Chest Portable 1 View  Result Date: 03/22/2021 CLINICAL DATA:  Sepsis EXAM: PORTABLE CHEST 1 VIEW COMPARISON:  None. FINDINGS: The heart size and mediastinal contours are unchanged. Aortic calcification. Biapical pleural/pulmonary scarring. No focal consolidation. No pulmonary edema. No pleural effusion. No pneumothorax. No acute osseous abnormality. IMPRESSION: No active disease. Electronically Signed   By: Iven Finn M.D.   On: 03/22/2021 17:18   CT HEAD CODE STROKE WO CONTRAST  Result Date: 03/22/2021 CLINICAL DATA:  Code stroke. EXAM: CT HEAD WITHOUT CONTRAST TECHNIQUE: Contiguous axial images were obtained from the base of the skull through the vertex without intravenous contrast. COMPARISON:  MRI Feb 08, 2021.  CT head Feb 07, 2021. FINDINGS: Motion limited exam.  Within this limitation: Brain: Evolution of the previously seen infarct in the right MCA territory with developing encephalomalacia in the right insula and basal ganglia.No definite superimposed acute/interval large vascular territory infarct. No acute hemorrhage. Similar generalized atrophy with ex vacuo ventricular dilation. The no hydrocephalus. No mass lesion or abnormal mass effect. Vascular: No hyperdense vessel identified. Calcific intracranial atherosclerosis. Skull: No acute fracture Sinuses/Orbits: Clear  visualized sinuses. No acute orbital findings. Other: No mastoid effusions. ASPECTS [facility] Stroke Program Early CT Score) Total score (0-10 with 10 being normal): 10. Areas of right MCA hypoattenuation appear to correlate with prior infarcts seen on May 27 MRI. IMPRESSION: Evolution of the previously seen infarct in the right MCA territory with developing encephalomalacia in the right insula and basal ganglia. No definite superimposed acute/interval large vascular territory infarct or acute hemorrhage on this motion limited exam. An MRI could provide more sensitive evaluation for peri-infarct ischemia if clinically indicated. Code stroke imaging results were communicated on 03/22/2021 at 4:26 pm to provider Dr. Quinn Axe via telephone, who verbally acknowledged these results. Electronically Signed   By: Margaretha Sheffield MD   On: 03/22/2021 16:29   (Echo, Carotid, EGD, Colonoscopy, ERCP)    Subjective:   Discharge Exam: Vitals:   03/27/21 0753 03/27/21 1127  BP: (!) 151/74 (!) 124/59  Pulse: 100 88  Resp: 17 18  Temp: 97.8 F (36.6 C) 97.7 F (36.5 C)  SpO2: 100% 98%   Vitals:   03/26/21 1954 03/27/21 0645 03/27/21 0753 03/27/21 1127  BP: (!) 152/86 (!) 119/59 (!) 151/74 (!) 124/59  Pulse: 97 88 100 88  Resp: _0 Temp: 98.1 F (36.7 C) 98.1 F (36.7 C) 97.8 F (36.6 C) 97.7 F (36.5 C)  TempSrc:  Oral Oral Oral  SpO2: 100% 100% 100% 98%    General: Pt is alert, awake, not in acute distress Cardiovascular: S1/S2 +, no rubs, no gallops Respiratory: CTA bilaterally, no wheezing, no rhonchi Abdominal: Soft, NT, ND, bowel sounds + Extremities: no cyanosis    The results of significant diagnostics from this hospitalization (including imaging, microbiology, ancillary and laboratory) are listed below for reference.     Microbiology: Recent Results (from the past 240 hour(s))  Culture, blood (routine x 2)     Status: None   Collection Time: 03/22/21  4:20 PM   Specimen:  BLOOD  Result Value Ref Range Status   Specimen Description BLOOD LEFT ANTECUBITAL  Final   Special Requests   Final    BOTTLES DRAWN AEROBIC AND ANAEROBIC Blood Culture results may not be  optimal due to an excessive volume of blood received in culture bottles   Culture   Final    NO GROWTH 5 DAYS Performed at [facility], [address] [place], [place] [address]    Report Status 03/27/2021 FINAL  Final  Culture, blood (routine x 2)     Status: None   Collection Time: 03/22/21  4:25 PM   Specimen: BLOOD  Result Value Ref Range Status   Specimen Description BLOOD BLOOD LEFT HAND  Final   Special Requests   Final    BOTTLES DRAWN AEROBIC AND ANAEROBIC Blood Culture adequate volume   Culture   Final    NO GROWTH 5 DAYS Performed at [facility], [address], [place], [place] [address]    Report Status 03/27/2021 FINAL  Final  Urine culture     Status: Abnormal   Collection Time: 03/22/21  5:00 PM   Specimen: In/Out Cath Urine  Result Value Ref Range Status   Specimen Description   Final    IN/OUT CATH URINE Performed at [facility], [address], [place], [place] [address]    Special Requests   Final    NONE Performed at [facility], [address] [place], [place] [address]    Culture MULTIPLE SPECIES PRESENT, SUGGEST RECOLLECTION (A)  Final   Report Status 03/24/2021 FINAL  Final  Surgical PCR screen     Status: Abnormal   Collection Time: 03/24/21  6:58 AM   Specimen: Nasal Mucosa; Nasal Swab  Result Value Ref Range Status   MRSA, PCR POSITIVE (A) NEGATIVE Final    Comment: RESULT CALLED TO, READ BACK BY AND VERIFIED WITH: Margarite Gouge AT [id number] 03/24/21.PMF    Staphylococcus aureus POSITIVE (A) NEGATIVE Final    Comment: (NOTE) The Xpert SA Assay (FDA approved for NASAL specimens in patients 64 years of age and older), is one component of a comprehensive surveillance program. It is not intended to diagnose infection nor  to guide or monitor treatment. Performed at [facility] [address], [place], [place] [address]   Aerobic/Anaerobic Culture w Gram Stain (surgical/deep wound)     Status: None (Preliminary result)   Collection Time: 03/24/21  8:51 AM   Specimen: PATH Other; Tissue  Result Value Ref Range Status   Specimen Description   Final    WOUND LEFT ELBOW Performed at [facility], [address], [place], [place] [address]    Special Requests   Final    NONE Performed at [facility], [address] [place], [place] [address]    Gram Stain   Final    RARE WBC PRESENT, PREDOMINANTLY PMN RARE GRAM POSITIVE COCCI IN PAIRS Performed at [facility], [place] [address], [place], [place] [address]    Culture   Final    ABUNDANT STAPHYLOCOCCUS AUREUS SUSCEPTIBILITIES PERFORMED ON PREVIOUS CULTURE WITHIN THE LAST 5 DAYS. NO ANAEROBES ISOLATED; CULTURE IN PROGRESS FOR 5 DAYS    Report Status PENDING  Incomplete  Aerobic/Anaerobic Culture w Gram Stain (surgical/deep wound)     Status: None (Preliminary result)   Collection Time: 03/24/21  8:52 AM   Specimen: PATH Other; Tissue  Result Value Ref Range Status   Specimen Description   Final    WOUND LEFT ELBOW DEEP Performed at [facility], [address] [place], [place] [address]    Special Requests   Final    NONE Performed at [facility], [phone]  [place], [place] [address]    Gram Stain   Final    NO WBC SEEN NO ORGANISMS SEEN Performed at [facility], [place] [address], [place], [place] [address]    Culture   Final    FEW METHICILLIN RESISTANT STAPHYLOCOCCUS AUREUS NO ANAEROBES ISOLATED; CULTURE IN PROGRESS FOR 5 DAYS    Report Status PENDING  Incomplete   Organism ID, Bacteria METHICILLIN RESISTANT STAPHYLOCOCCUS AUREUS  Final      Susceptibility   Methicillin resistant staphylococcus aureus - MIC*    CIPROFLOXACIN >=8 RESISTANT Resistant     ERYTHROMYCIN  >=8 RESISTANT Resistant     GENTAMICIN <=0.5 SENSITIVE Sensitive     OXACILLIN >=4 RESISTANT Resistant     TETRACYCLINE <=1 SENSITIVE Sensitive     VANCOMYCIN 1 SENSITIVE Sensitive     TRIMETH/SULFA <=10 SENSITIVE Sensitive     CLINDAMYCIN >=8 RESISTANT Resistant     RIFAMPIN <=0.5 SENSITIVE Sensitive     Inducible Clindamycin NEGATIVE Sensitive     * FEW METHICILLIN RESISTANT STAPHYLOCOCCUS AUREUS     Labs: BNP (last 3 results) No results for input(s): BNP in the last 8760 hours. Basic Metabolic Panel: Recent Labs  Lab 03/22/21 1600 03/23/21 0630 03/24/21 0439 03/25/21 0609 03/27/21 0611  NA 129* 134*  --  136 139  K 4.2 3.8  --  3.4* 4.0  CL 99 104  --  106 107  CO2 20* 23  --  25 22  GLUCOSE 411* 230*  --  146* 117*  BUN 26* 22  --  17 16  CREATININE 1.23* 1.03* 1.02* 1.03* 0.88  CALCIUM 8.9 8.4*  --  7.7* 8.6*   Liver Function Tests: Recent Labs  Lab 03/22/21 1600  AST 44*  ALT 31  ALKPHOS 144*  BILITOT 1.8*  PROT 7.3  ALBUMIN 3.4*   No results for input(s): LIPASE, AMYLASE in the last 168 hours. No results for input(s): AMMONIA in the last 168 hours. CBC: Recent Labs  Lab 03/22/21 1600 03/23/21 0630 03/24/21 0439 03/25/21 0609 03/27/21 0611  WBC 17.8* 17.8* 14.8* 10.9* 9.3  NEUTROABS 15.7*  --   --   --   --   HGB 14.6 12.9 12.2 11.5* 12.5  HCT 41.9 35.7* 34.9* 33.7* 36.7  MCV 93.7 95.2 92.6 97.4 95.1  PLT 281 235 225 205 280   Cardiac Enzymes: No results for input(s): CKTOTAL, CKMB, CKMBINDEX, TROPONINI in the last 168 hours. BNP: Invalid input(s): POCBNP CBG: Recent Labs  Lab 03/26/21 1957 03/26/21 2328 03/27/21 0425 03/27/21 0752 03/27/21 1127  GLUCAP 223* 95 122* 105* 187*   D-Dimer No results for input(s): DDIMER in the last 72 hours. Hgb A1c No results for input(s): HGBA1C in the last 72 hours. Lipid Profile No results for input(s): CHOL, HDL, LDLCALC, TRIG, CHOLHDL, LDLDIRECT in the last 72 hours. Thyroid function studies No  results for input(s): TSH, T4TOTAL, T3FREE, THYROIDAB in the last 72 hours.  Invalid input(s): FREET3 Anemia work up No results for input(s): VITAMINB12, FOLATE, FERRITIN, TIBC, IRON, RETICCTPCT in the last 72 hours. Urinalysis    Component Value Date/Time   COLORURINE YELLOW (A) 03/24/2021 0826   APPEARANCEUR HAZY (A) 03/24/2021 0826   LABSPEC 1.011 03/24/2021 0826   PHURINE 5.0 03/24/2021 0826   GLUCOSEU >=500 (A) 03/24/2021 0826   HGBUR LARGE (A) 03/24/2021 0826   BILIRUBINUR NEGATIVE 03/24/2021 0826   BILIRUBINUR Negative 01/23/2020 1423   KETONESUR NEGATIVE 03/24/2021 0826   PROTEINUR 30 (A)  03/24/2021 0826   UROBILINOGEN 0.2 01/23/2020 1423   NITRITE NEGATIVE 03/24/2021 0826   LEUKOCYTESUR NEGATIVE 03/24/2021 0826   Sepsis Labs Invalid input(s): PROCALCITONIN,  WBC,  LACTICIDVEN Microbiology Recent Results (from the past 240 hour(s))  Culture, blood (routine x 2)     Status: None   Collection Time: 03/22/21  4:20 PM   Specimen: BLOOD  Result Value Ref Range Status   Specimen Description BLOOD LEFT ANTECUBITAL  Final   Special Requests   Final    BOTTLES DRAWN AEROBIC AND ANAEROBIC Blood Culture results may not be optimal due to an excessive volume of blood received in culture bottles   Culture   Final    NO GROWTH 5 DAYS Performed at [facility], [address] [place], [place] [address]    Report Status 03/27/2021 FINAL  Final  Culture, blood (routine x 2)     Status: None   Collection Time: 03/22/21  4:25 PM   Specimen: BLOOD  Result Value Ref Range Status   Specimen Description BLOOD BLOOD LEFT HAND  Final   Special Requests   Final    BOTTLES DRAWN AEROBIC AND ANAEROBIC Blood Culture adequate volume   Culture   Final    NO GROWTH 5 DAYS Performed at [facility], [address], [place], [place] [address]    Report Status 03/27/2021 FINAL  Final  Urine culture     Status: Abnormal   Collection Time: 03/22/21  5:00 PM   Specimen:  In/Out Cath Urine  Result Value Ref Range Status   Specimen Description   Final    IN/OUT CATH URINE Performed at [facility], [address] [place], [place] [address]    Special Requests   Final    NONE Performed at [facility], [address] [place], [place] [address]    Culture MULTIPLE SPECIES PRESENT, SUGGEST RECOLLECTION (A)  Final   Report Status 03/24/2021 FINAL  Final  Surgical PCR screen     Status: Abnormal   Collection Time: 03/24/21  6:58 AM   Specimen: Nasal Mucosa; Nasal Swab  Result Value Ref Range Status   MRSA, PCR POSITIVE (A) NEGATIVE Final    Comment: RESULT CALLED TO, READ BACK BY AND VERIFIED WITH: Margarite Gouge AT [id number] 03/24/21.PMF    Staphylococcus aureus POSITIVE (A) NEGATIVE Final    Comment: (NOTE) The Xpert SA Assay (FDA approved for NASAL specimens in patients 52 years of age and older), is one component of a comprehensive surveillance program. It is not intended to diagnose infection nor to guide or monitor treatment. Performed at [facility], [address] [place], [place] [address]   Aerobic/Anaerobic Culture w Gram Stain (surgical/deep wound)     Status: None (Preliminary result)   Collection Time: 03/24/21  8:51 AM   Specimen: PATH Other; Tissue  Result Value Ref Range Status   Specimen Description   Final    WOUND LEFT ELBOW Performed at [facility], [address] [place], [place] [address]    Special Requests   Final    NONE Performed at [facility], [address] [place], [place] [address]    Gram Stain   Final    RARE WBC PRESENT, PREDOMINANTLY PMN RARE GRAM POSITIVE COCCI IN PAIRS Performed at [facility], [place] [address] [place], [place] [address]    Culture   Final    ABUNDANT STAPHYLOCOCCUS AUREUS SUSCEPTIBILITIES PERFORMED ON PREVIOUS CULTURE WITHIN THE LAST  5 DAYS. NO ANAEROBES ISOLATED; CULTURE IN PROGRESS FOR 5 DAYS    Report Status PENDING   Incomplete  Aerobic/Anaerobic Culture w Gram Stain (surgical/deep wound)     Status: None (Preliminary result)   Collection Time: 03/24/21  8:52 AM   Specimen: PATH Other; Tissue  Result Value Ref Range Status   Specimen Description   Final    WOUND LEFT ELBOW DEEP Performed at [facility], [address], [place], [place] [address]    Special Requests   Final    NONE Performed at [facility], [address] [place], [place] [address]    Gram Stain   Final    NO WBC SEEN NO ORGANISMS SEEN Performed at [facility], [place] [address], [place], [place] [address]    Culture   Final    FEW METHICILLIN RESISTANT STAPHYLOCOCCUS AUREUS NO ANAEROBES ISOLATED; CULTURE IN PROGRESS FOR 5 DAYS    Report Status PENDING  Incomplete   Organism ID, Bacteria METHICILLIN RESISTANT STAPHYLOCOCCUS AUREUS  Final      Susceptibility   Methicillin resistant staphylococcus aureus - MIC*    CIPROFLOXACIN >=8 RESISTANT Resistant     ERYTHROMYCIN >=8 RESISTANT Resistant     GENTAMICIN <=0.5 SENSITIVE Sensitive     OXACILLIN >=4 RESISTANT Resistant     TETRACYCLINE <=1 SENSITIVE Sensitive     VANCOMYCIN 1 SENSITIVE Sensitive     TRIMETH/SULFA <=10 SENSITIVE Sensitive     CLINDAMYCIN >=8 RESISTANT Resistant     RIFAMPIN <=0.5 SENSITIVE Sensitive     Inducible Clindamycin NEGATIVE Sensitive     * FEW METHICILLIN RESISTANT STAPHYLOCOCCUS AUREUS     Time coordinating discharge: Over 30 minutes  SIGNED:   Wyvonnia Dusky, MD  [facility] 03/27/2021, 12:06 PM Pager   If 7PM-7AM, please contact night-coverage

## 2021-03-27 NOTE — TOC Transition Note (Signed)
Transition of Care [facility]) - CM/SW Discharge Note   Patient Details  Name: Michelle Vang MRN: [id number] Date of Birth: 1928/10/30  Transition of Care [facility]) CM/SW Contact:  Alberteen Sam, LCSW Phone Number: 03/27/2021, 1:18 PM   Clinical Narrative:     CSW spoke with patient's daughter [name] regarding plan for home today. Confirmed active with [facility] home health, agreeable to resume home health PT, OT and RN. [facility] informed of discharge today.    [facility] home has been called to:   [place]  RN made aware, no other dc needs.    Final next level of care: [place] Barriers to Discharge: Continued Medical Work up   Patient Goals and CMS Choice Patient states their goals for this hospitalization and ongoing recovery are:: to go home CMS Medicare.gov Compare Post Acute Care list provided to:: Patient Choice offered to / list presented to : Patient  Discharge Placement                       Discharge Plan and Services     Post Acute Care Choice: Home Health                    HH Arranged: PT, OT, RN, Nurse's Aide [facility] Agency: [facility] Date [facility] Agency Contacted: 03/26/21 Time [place]: [id number] Representative spoke with at [place]: [place] ([place]) Interventions     Readmission Risk Interventions No flowsheet data found.

## 2021-03-27 NOTE — Progress Notes (Signed)
Physical Therapy Treatment Patient Details Name: MIRA BALON MRN: [id number] DOB: 12-12-1928 Today's Date: 03/27/2021    History of Present Illness 87 yo Female was admitted with progression of LUE/LE weakness. Patient was recently admitted 5/26-02/14/21 with new onset R MCA infarct. Family brought her back to the ED for concern of worsening stroke. CT scan shows evolution of right MCA devloping encephalomalacia to right insula and basal ganglia. She was diagnosed with sepsis and build up of lactic acidosis. Irrigation and debridement of L elbow for septic olecranon bursitis performed on 03/24/21. PMH significant: R MCA stroke, A-fib, CKD III, HTN, DMx2, HLD, dysphagia, history of lung/bladder CA, hypothyroidism, Hard of Hearing, Mild dementia; Daughter present at bedside to help with history intake;    PT Comments    Pt alert, happy, motivated to work with therapy. Cognitive function challenging to define due to loss of hearing. Multi-modal cues are necessary for redirection of tasks. Progressed ambulation distance to 60 ft with RW, min-guard assist. Pt demonstrated ability to reach outside BOS w/ RW, for a drink of water without LOB. Transfers require min from lower surfaces such as toilet due to lack of momentum. Skilled PT intervention is indicated to address deficits in function, mobility, and to return to PLOF as able.  Discharge recommendations remain HHPT.   Follow Up Recommendations  Home health PT     Equipment Recommendations  None recommended by PT    Recommendations for Other Services       Precautions / Restrictions Precautions Precautions: Fall Restrictions Weight Bearing Restrictions: No Other Position/Activity Restrictions: Per MD, no excessive AROM, able to ext L elbow, minimize repetitive elbow flex    Mobility  Bed Mobility               General bed mobility comments: Pt seated at EOB    Transfers Overall transfer level: Needs assistance Equipment used:  Rolling walker (2 wheeled) Transfers: Sit to/from Stand Sit to Stand: Min assist;Min guard         General transfer comment: Requires min-assist toilet > stand, min-gaurd from elevated surface  Ambulation/Gait Ambulation/Gait assistance: Min guard Gait Distance (Feet): 60 Feet Assistive device: Rolling walker (2 wheeled)   Gait velocity: decreased   General Gait Details: Requires cues for RW hand placement, implusive w/ gait   Stairs             Wheelchair Mobility    Modified Rankin (Stroke Patients Only)       Balance Overall balance assessment: Needs assistance Sitting-balance support: Feet supported Sitting balance-Leahy Scale: Good     Standing balance support: Single extremity supported Standing balance-Leahy Scale: Fair Standing balance comment: able to stand w/ RW reaching outside BOS for a drink of water w/ unilateral UE                            Cognition Arousal/Alertness: Awake/alert Behavior During Therapy: WFL for tasks assessed/performed Overall Cognitive Status: Difficult to assess                                 General Comments: HOH, slight impuslivity with movement but redirects with tactile, verbal cues      Exercises      General Comments General comments (skin integrity, edema, etc.): wound vac to L elbow      Pertinent Vitals/Pain Pain Assessment: No/denies pain    Home  Living                      Prior Function            PT Goals (current goals can now be found in the care plan section) Acute Rehab PT Goals Patient Stated Goal: to go home PT Goal Formulation: With patient Time For Goal Achievement: 04/06/21 Potential to Achieve Goals: Good Progress towards PT goals: Progressing toward goals    Frequency    Min 2X/week      PT Plan Current plan remains appropriate    Co-evaluation              AM-PAC PT "6 Clicks" Mobility   Outcome Measure  Help needed turning  from your back to your side while in a flat bed without using bedrails?: A Little Help needed moving from lying on your back to sitting on the side of a flat bed without using bedrails?: A Little Help needed moving to and from a bed to a chair (including a wheelchair)?: A Little Help needed standing up from a chair using your arms (e.g., wheelchair or bedside chair)?: A Little Help needed to walk in hospital room?: A Little Help needed climbing 3-5 steps with a railing? : A Lot 6 Click Score: 17    End of Session Equipment Utilized During Treatment: Gait belt Activity Tolerance: Patient tolerated treatment well Patient left: in chair;with call bell/phone within reach;with chair alarm set;with family/visitor present   PT Visit Diagnosis: Unsteadiness on feet (R26.81);Muscle weakness (generalized) (M62.81);Difficulty in walking, not elsewhere classified (R26.2)     Time: 3428-7681 PT Time Calculation (min) (ACUTE ONLY): 24 min  Charges:                       [employer], SPT

## 2021-03-28 ENCOUNTER — Telehealth: Payer: Self-pay

## 2021-03-28 NOTE — Telephone Encounter (Signed)
Per daughter [name]   Transition Care Management Follow-up Telephone Call Date of discharge and from where: 03/27/2021 [facility] How have you been since you were released from the hospital? Doing ok Any questions or concerns? No  Items Reviewed: Did the pt receive and understand the discharge instructions provided? Yes  Medications obtained and verified? Yes  Other? No  Any new allergies since your discharge? No  Dietary orders reviewed? Yes Do you have support at home? Yes   Home Care and Equipment/Supplies: Were home health services ordered? yes If so, what is the name of the agency?   Has the agency set up a time to come to the patient's home? no Were any new equipment or medical supplies ordered?  No What is the name of the medical supply agency? N/a Were you able to get the supplies/equipment? not applicable Do you have any questions related to the use of the equipment or supplies? No  Functional Questionnaire: (I = Independent and D = Dependent) ADLs: D  Bathing/Dressing- D  Meal Prep- D  Eating- I  Maintaining continence- I, supervision  Transferring/Ambulation-  I, supervision  Managing Meds- D  Follow up appointments reviewed:  PCP Hospital f/u appt confirmed? Yes  Scheduled to see Webb Silversmith NP on 04/08/2021 @ 2:40. Are transportation arrangements needed? No  If their condition worsens, is the pt aware to call PCP or go to the Emergency Dept.? Yes Was the patient provided with contact information for the PCP's office or ED? Yes Was to pt encouraged to call back with questions or concerns? Yes

## 2021-03-29 LAB — AEROBIC/ANAEROBIC CULTURE W GRAM STAIN (SURGICAL/DEEP WOUND): Gram Stain: NONE SEEN

## 2021-04-02 ENCOUNTER — Other Ambulatory Visit: Payer: Self-pay | Admitting: Internal Medicine

## 2021-04-02 ENCOUNTER — Ambulatory Visit: Payer: Medicare Other | Attending: Infectious Diseases | Admitting: Infectious Diseases

## 2021-04-02 ENCOUNTER — Other Ambulatory Visit: Payer: Self-pay

## 2021-04-02 DIAGNOSIS — M7022 Olecranon bursitis, left elbow: Secondary | ICD-10-CM | POA: Diagnosis not present

## 2021-04-02 NOTE — Telephone Encounter (Signed)
  Notes to clinic:  medication filled by a different provider  Review for refill   Requested Prescriptions  Pending Prescriptions Disp Refills   busPIRone (BUSPAR) 5 MG tablet [Pharmacy Med Name: BUSPIRONE HCL 5 MG TAB] 60 tablet 1    Sig: TAKE 1 TABLET BY MOUTH TWICE A DAY      Psychiatry: Anxiolytics/Hypnotics - Non-controlled Passed - 04/02/2021 11:38 AM      Passed - Valid encounter within last 6 months    Recent Outpatient Visits           1 week ago Left arm pain   [facility] [employer], [place] W, NP   1 month ago Type 2 diabetes mellitus with hyperglycemia, without long-term current use of insulin [facility])   [facility] [place], [place] W, NP   1 month ago Atrial fibrillation with rapid ventricular response [facility])   [facility], [place]alie Keens, NP       Future Appointments             In 6 days [place], Coralie Keens, NP [facility], [place]   In 2 months [place], Coralie Keens, NP [facility], [facility]

## 2021-04-02 NOTE — Progress Notes (Signed)
The purpose of this virtual visit is to provide medical care while limiting exposure to the novel coronavirus (COVID19) for both patient and office staff.   Consent was obtained for phone visit:  Yes.   Answered questions that patient had about telehealth interaction:  Yes.   I discussed the limitations, risks, security and privacy concerns of performing an evaluation and management service by telephone. I also discussed with the patient that there may be a patient responsible charge related to this service. The patient expressed understanding and agreed to proceed.   Patient Location: Home Provider Location: office People on the video call- patient, Michelle Vang her attendant and her daughter Michelle Vang was on the phone. And the provider Pt was recently discharged from the hospital after having septic olecranon bursitis due to MRSA. She had I/D on 03/24/21- She intially was given IV antibiotics and discharged on PO bactrim Pt has neurocog impairment and so her care taker is answering the questions. Patient says she is doing fine As per caretaker swelling of the left elbow has improved. Erythema better No fever Pt has reduced appetite No diarrhea No pain abdomen  O/e pt looks okay Left elbow /left forearm as far as I could see was less swollen and not erythematous Sutures present  MRSA left elbow olecranon bursitis s/p I/d On Po bactrim- for 2 weeks- She will see Dr.Patel next week and will decide whether she needs more antibiotics Will do CBC/CMP - faxed to [place] daughter's request Discussed the management with daughter and care giver Total time spent 17 min

## 2021-04-08 ENCOUNTER — Encounter: Payer: Self-pay | Admitting: Internal Medicine

## 2021-04-08 ENCOUNTER — Other Ambulatory Visit: Payer: Self-pay

## 2021-04-08 ENCOUNTER — Ambulatory Visit (INDEPENDENT_AMBULATORY_CARE_PROVIDER_SITE_OTHER): Payer: Medicare Other | Admitting: Internal Medicine

## 2021-04-08 VITALS — BP 101/62 | HR 100 | Temp 98.6°F | Ht 63.0 in

## 2021-04-08 DIAGNOSIS — R5383 Other fatigue: Secondary | ICD-10-CM | POA: Diagnosis not present

## 2021-04-08 DIAGNOSIS — Z8673 Personal history of transient ischemic attack (TIA), and cerebral infarction without residual deficits: Secondary | ICD-10-CM | POA: Diagnosis not present

## 2021-04-08 DIAGNOSIS — R531 Weakness: Secondary | ICD-10-CM | POA: Diagnosis not present

## 2021-04-08 DIAGNOSIS — Z794 Long term (current) use of insulin: Secondary | ICD-10-CM

## 2021-04-08 DIAGNOSIS — M71122 Other infective bursitis, left elbow: Secondary | ICD-10-CM | POA: Diagnosis not present

## 2021-04-08 DIAGNOSIS — N184 Chronic kidney disease, stage 4 (severe): Secondary | ICD-10-CM

## 2021-04-08 DIAGNOSIS — E1169 Type 2 diabetes mellitus with other specified complication: Secondary | ICD-10-CM

## 2021-04-08 DIAGNOSIS — E782 Mixed hyperlipidemia: Secondary | ICD-10-CM

## 2021-04-08 DIAGNOSIS — G3184 Mild cognitive impairment, so stated: Secondary | ICD-10-CM

## 2021-04-08 LAB — CBC WITH DIFFERENTIAL/PLATELET
Absolute Monocytes: 980 cells/uL — ABNORMAL HIGH (ref 200–950)
Basophils Absolute: 100 cells/uL (ref 0–200)
Basophils Relative: 1 %
Eosinophils Absolute: 290 cells/uL (ref 15–500)
Eosinophils Relative: 2.9 %
HCT: 42.5 % (ref 35.0–45.0)
Hemoglobin: 14 g/dL (ref 11.7–15.5)
Lymphs Abs: 1320 cells/uL (ref 850–3900)
MCH: 31.5 pg (ref 27.0–33.0)
MCHC: 32.9 g/dL (ref 32.0–36.0)
MCV: 95.5 fL (ref 80.0–100.0)
MPV: 9.7 fL (ref 7.5–12.5)
Monocytes Relative: 9.8 %
Neutro Abs: 7310 cells/uL (ref 1500–7800)
Neutrophils Relative %: 73.1 %
Platelets: 391 10*3/uL (ref 140–400)
RBC: 4.45 10*6/uL (ref 3.80–5.10)
RDW: 12.1 % (ref 11.0–15.0)
Total Lymphocyte: 13.2 %
WBC: 10 10*3/uL (ref 3.8–10.8)

## 2021-04-08 LAB — BASIC METABOLIC PANEL WITH GFR
BUN/Creatinine Ratio: 12 (calc) (ref 6–22)
BUN: 23 mg/dL (ref 7–25)
CO2: 18 mmol/L — ABNORMAL LOW (ref 20–32)
Calcium: 9.5 mg/dL (ref 8.6–10.4)
Chloride: 102 mmol/L (ref 98–110)
Creat: 1.85 mg/dL — ABNORMAL HIGH (ref 0.60–0.95)
Glucose, Bld: 191 mg/dL — ABNORMAL HIGH (ref 65–99)
Potassium: 4.7 mmol/L (ref 3.5–5.3)
Sodium: 132 mmol/L — ABNORMAL LOW (ref 135–146)
eGFR: 26 mL/min/{1.73_m2} — ABNORMAL LOW (ref >=60)

## 2021-04-08 NOTE — Patient Instructions (Signed)
Weakness Weakness is a lack of strength. You may feel weak all over your body (generalized), or you may feel weak in one part of your body (focal). There are many potential causes of weakness. Sometimes, the cause of your weakness may not be known. Some causes of weakness can be serious, so it isimportant to see your doctor. Follow these instructions at home: Activity Rest as needed. Try to get enough sleep. Most adults need 7-8 hours of sleep each night. Talk to your doctor about how much sleep you need each night. Do exercises, such as arm curls and leg raises, for 30 minutes at least 2 days a week or as told by your doctor. Think about working with a physical therapist or trainer to help you get stronger. General instructions  Take over-the-counter and prescription medicines only as told by your doctor. Eat a healthy, well-balanced diet. This includes: Proteins to build muscles, such as lean meats and fish. Fresh fruits and vegetables. Carbohydrates to boost energy, such as whole grains. Drink enough fluid to keep your pee (urine) pale yellow. Keep all follow-up visits as told by your doctor. This is important.  Contact a doctor if: Your weakness does not get better or it gets worse. Your weakness affects your ability to: Think clearly. Do your normal daily activities. Get help right away if you: Have sudden weakness on one side of your face or body. Have chest pain. Have trouble breathing or shortness of breath. Have problems with your vision. Have trouble talking or swallowing. Have trouble standing or walking. Are light-headed. Pass out (lose consciousness). Summary Weakness is a lack of strength. You may feel weak all over your body or just in one part of your body. There are many potential causes of weakness. Sometimes, the cause of your weakness may not be known. Rest as needed, and try to get enough sleep. Most adults need 7-8 hours of sleep each night. Eat a healthy,  well-balanced diet. This information is not intended to replace advice given to you by your health care provider. Make sure you discuss any questions you have with your healthcare provider. Document Revised: 04/07/2018 Document Reviewed: 04/07/2018 Elsevier Patient Education  [place].

## 2021-04-08 NOTE — Progress Notes (Signed)
Subjective:    Patient ID: Michelle Vang, female    DOB: Jun 18, 1929, 87 y.o.   MRN: [id number]  HPI  Pt presents to the clinic today for TCM hospital follow up. She presented to the ER 7/8 with lethargy and left sided weakness. She was seen by me earlier in the day for fall with left arm pain and skin tear to left elbow. Xrays were ordered but her daughter decided to take her to the ER for further evaluation of symptoms, as she had a previous stroke in May. She was found to be febrile and septic. CT head showed:  IMPRESSION: Evolution of the previously seen infarct in the right MCA territory with developing encephalomalacia in the right insula and basal ganglia. No definite superimposed acute/interval large vascular territory infarct or acute hemorrhage on this motion limited exam. An MRI could provide more sensitive evaluation for peri-infarct ischemia if clinically indicated.  MRI of the Left Elbow showed:  IMPRESSION: Skin wound over the olecranon with heterogeneous fluid in the olecranon bursa worrisome for phlegmon or septic bursitis. Negative for osteomyelitis or septic joint. Intense edema in subcutaneous tissues about the elbow compatible with dependent change or cellulitis.  Neurology and ortho was consulted. They did not feel like this was a new stroke but exacerbation of symptoms in the setting of sepsis. Dr. Posey Pronto did an I&D of her left elbow with wound vac placement on 7/12. She was discharged 7/13 and advised for follow up with PCP, neurology, ID and orthopedics.  Since discharge, her daughter reports she has not been eating well. She has had some loose stools. Her daughter reports she seems to have some weakness in her legs, has been seen by PT once. Daughter reports she has been confused. Her sugars have been running really good. She has appts set up with ID and orthopedics.  Review of Systems  Past Medical History:  Diagnosis Date   Bladder cancer ([place])    Diabetes  mellitus without complication (HCC)    GERD (gastroesophageal reflux disease)    Hypertension    Lung cancer (HCC)    Thyroid disease     Current Outpatient Medications  Medication Sig Dispense Refill   apixaban (ELIQUIS) 2.5 MG TABS tablet Take 2 tablets (5 mg total) by mouth 2 (two) times daily. 60 tablet 0   atorvastatin (LIPITOR) 10 MG tablet TAKE 1 TABLET BY MOUTH ONCE A DAY 90 tablet 1   Blood Glucose Monitoring Suppl (TRUE FOCUS BLOOD GLUCOSE METER) DEVI 1 Device by Does not apply route daily. 1 each 1   busPIRone (BUSPAR) 5 MG tablet TAKE 1 TABLET BY MOUTH TWICE A DAY 60 tablet 1   Cholecalciferol (VITAMIN D3) 1000 UNITS CAPS Take 1 capsule by mouth daily. Reported on 11/15/2015     fluocinonide (LIDEX) 0.05 % external solution Apply 1 application topically as needed.     glucose blood (TRUE FOCUS BLOOD GLUCOSE STRIP) test strip Use as instructed 100 each 12   insulin glargine (LANTUS) 100 UNIT/ML injection Inject 0.07 mLs (7 Units total) into the skin daily. (Patient taking differently: Inject 10 Units into the skin daily.) 10 mL 11   Lancets (ONETOUCH ULTRASOFT) lancets Use as instructed 100 each 12   levothyroxine (SYNTHROID) 88 MCG tablet TAKE 1 TAB BY MOUTH ONCE DAILY. TAKE ON AN EMPTY STOMACH WITH A GLASS OF WATER ATLEAST 30-60 MINUTES BEFORE BREAKFAST (Patient taking differently: Take 88 mcg by mouth daily before breakfast.) 90 tablet 0   memantine (  NAMENDA) 10 MG tablet TAKE 1 TABLET BY MOUTH TWICE A DAY 180 tablet 0   metoprolol succinate (TOPROL-XL) 25 MG 24 hr tablet TAKE 1 TABLET BY MOUTH ONCE A DAY 90 tablet 0   Omega-3 Fatty Acids (FISH OIL) 1000 MG CAPS Take 1 capsule by mouth daily.     sulfamethoxazole-trimethoprim (BACTRIM DS) 800-160 MG tablet Take 1 tablet by mouth every 12 (twelve) hours for 14 days. 28 tablet 0   No current facility-administered medications for this visit.    Allergies  Allergen Reactions   Aricept [Donepezil Hcl] Diarrhea    Family History   Problem Relation Age of Onset   Stroke Mother    Heart disease Father    Cancer Sister        Pancreatic    Social History   Socioeconomic History   Marital status: Widowed    Spouse name: Not on file   Number of children: 2   Years of education: Not on file   Highest education level: Not on file  Occupational History   Occupation: Part time Scientist, water quality  Tobacco Use   Smoking status: Never   Smokeless tobacco: Never  Vaping Use   Vaping Use: Never used  Substance and Sexual Activity   Alcohol use: No    Alcohol/week: 0.0 standard drinks   Drug use: No   Sexual activity: Not Currently  Other Topics Concern   Not on file  Social History Narrative   Right handed      Completed 12th grade      Lives alone has care givers    Social Determinants of Health   Financial Resource Strain: Not on file  Food Insecurity: Not on file  Transportation Needs: Not on file  Physical Activity: Not on file  Stress: Not on file  Social Connections: Not on file  Intimate Partner Violence: Not on file     Constitutional: Denies fever, malaise, fatigue, headache or abrupt weight changes.  HEENT: Denies eye pain, eye redness, ear pain, ringing in the ears, wax buildup, runny nose, nasal congestion, bloody nose, or sore throat. Respiratory: Denies difficulty breathing, shortness of breath, cough or sputum production.   Cardiovascular: Denies chest pain, chest tightness, palpitations or swelling in the hands or feet.  Gastrointestinal: Family reports decreased appetite and loose stools. Denies abdominal pain, bloating, constipation, or blood in the stool.  GU: Denies urgency, frequency, pain with urination, burning sensation, blood in urine, odor or discharge. Musculoskeletal: Family reports swelling to left elbow, weakness to lower extremities. Denies decrease in range of motion, difficulty with gait, muscle pain or joint pain.  Skin: Family reports sutures to left elbow. Denies redness,  rashes, lesions or ulcercations.  Neurological: Pt reports difficulty with memory and balance. Denies dizziness, difficulty with memory, difficulty with speech or problems with coordination.   No other specific complaints in a complete review of systems (except as listed in HPI above).     Objective:   Physical Exam  BP 101/62 (BP Location: Right Arm, Patient Position: Sitting, Cuff Size: Large)   Pulse 100   Temp 98.6 F (37 C) (Temporal)   Ht 5\' 3"  (1.6 m)   SpO2 98%   BMI 35.07 kg/m   Wt Readings from Last 3 Encounters:  03/19/21 198 lb (89.8 kg)  02/20/21 198 lb 12.8 oz (90.2 kg)  02/07/21 199 lb 15.3 oz (90.7 kg)    General: Appears her stated age, obese in NAD. Skin: Sutured wound noted to left elbow.  HEENT: Head: normal shape and size;  Cardiovascular: Tachycardic with irregular rhythm.  Pulmonary/Chest: Normal effort and positive vesicular breath sounds. No respiratory distress. No wheezes, rales or ronchi noted.  Musculoskeletal: Strength 5/5 BUE. Hand grips equal. In wheelchair. Neurological: Alert . Pleasant.     BMET    Component Value Date/Time   NA 139 03/27/2021 0611   NA 140 03/22/2013 0800   K 4.0 03/27/2021 0611   K 4.1 03/22/2013 0800   CL 107 03/27/2021 0611   CL 106 03/22/2013 0800   CO2 22 03/27/2021 0611   CO2 27 03/22/2013 0800   GLUCOSE 117 (H) 03/27/2021 0611   GLUCOSE 104 (H) 03/22/2013 0800   BUN 16 03/27/2021 0611   BUN 15 03/22/2013 0800   CREATININE 0.88 03/27/2021 0611   CREATININE 1.25 (H) 02/05/2021 1346   CALCIUM 8.6 (L) 03/27/2021 0611   CALCIUM 9.2 03/22/2013 0800   GFRNONAA >60 03/27/2021 0611   GFRNONAA 59 (L) 03/22/2013 0800   GFRAA >60 03/22/2013 0800    Lipid Panel     Component Value Date/Time   CHOL 126 02/08/2021 0504   TRIG 103 02/08/2021 0504   HDL 35 (L) 02/08/2021 0504   CHOLHDL 3.6 02/08/2021 0504   VLDL 21 02/08/2021 0504   LDLCALC 70 02/08/2021 0504    CBC    Component Value Date/Time   WBC  9.3 03/27/2021 0611   RBC 3.86 (L) 03/27/2021 0611   HGB 12.5 03/27/2021 0611   HGB 13.0 09/22/2012 0934   HCT 36.7 03/27/2021 0611   HCT 39.4 09/22/2012 0934   PLT 280 03/27/2021 0611   PLT 253 09/22/2012 0934   MCV 95.1 03/27/2021 0611   MCV 91 09/22/2012 0934   MCH 32.4 03/27/2021 0611   MCHC 34.1 03/27/2021 0611   RDW 12.9 03/27/2021 0611   RDW 13.0 09/22/2012 0934   LYMPHSABS 0.6 (L) 03/22/2021 1600   LYMPHSABS 1.7 09/22/2012 0934   MONOABS 1.3 (H) 03/22/2021 1600   MONOABS 0.7 09/22/2012 0934   EOSABS 0.0 03/22/2021 1600   EOSABS 0.2 09/22/2012 0934   BASOSABS 0.1 03/22/2021 1600   BASOSABS 0.1 09/22/2012 0934    Hgb A1C Lab Results  Component Value Date   HGBA1C 9.4 (A) 02/05/2021            Assessment & Plan:   [facility] Follow Up for Left Sided Weakness, Lethargy, History of Stroke, HLD, DM 2 and Septic Bursitis:  Hospital notes, labs and imaging reviewed CBC and BMET today She will continue current meds She will continue home PT/OT She will follow up with ID and ortho as planned  Return precautions discussed  Webb Silversmith, NP This visit occurred during the SARS-CoV-2 public health emergency.  Safety protocols were in place, including screening questions prior to the visit, additional usage of staff PPE, and extensive cleaning of exam room while observing appropriate contact time as indicated for disinfecting solutions.

## 2021-04-09 ENCOUNTER — Telehealth: Payer: Self-pay

## 2021-04-09 DIAGNOSIS — N184 Chronic kidney disease, stage 4 (severe): Secondary | ICD-10-CM

## 2021-04-09 NOTE — Addendum Note (Signed)
Addended by: Jearld Fenton on: 04/09/2021 08:40 AM   Modules accepted: Orders

## 2021-04-09 NOTE — Telephone Encounter (Signed)
Lab order changed from [facility] to [place]. The pt daughter request.

## 2021-04-11 ENCOUNTER — Telehealth (INDEPENDENT_AMBULATORY_CARE_PROVIDER_SITE_OTHER): Payer: Medicare Other | Admitting: Internal Medicine

## 2021-04-11 ENCOUNTER — Encounter: Payer: Self-pay | Admitting: Internal Medicine

## 2021-04-11 DIAGNOSIS — N3000 Acute cystitis without hematuria: Secondary | ICD-10-CM | POA: Diagnosis not present

## 2021-04-11 DIAGNOSIS — R350 Frequency of micturition: Secondary | ICD-10-CM

## 2021-04-11 LAB — POCT URINALYSIS DIPSTICK
Glucose, UA: NEGATIVE
Ketones, UA: NEGATIVE
Nitrite, UA: NEGATIVE
Protein, UA: POSITIVE — AB
Spec Grav, UA: 1.015
Urobilinogen, UA: 0.2 U/dL
pH, UA: 5

## 2021-04-11 MED ORDER — NITROFURANTOIN MONOHYD MACRO 100 MG PO CAPS: 100.0000 mg | ORAL_CAPSULE | Freq: Two times a day (BID) | ORAL | 0 refills | Status: DC

## 2021-04-11 NOTE — Patient Instructions (Signed)
Urinary Frequency, Adult Urinary frequency means urinating more often than usual. You may urinate every 1-2 hours even though you drink a normal amount of fluid and do not have a bladder infection or condition. Although you urinate more often than normal,the total amount of urine produced in a day is normal. With urinary frequency, you may have an urgent need to urinate often. The stress and anxiety of needing to find a bathroom quickly can make this urge worse. This condition may go away on its own or you may need treatment at home. Home treatment may include bladder training, exercises, taking medicines, ormaking changes to your diet. Follow these instructions at home: Bladder health  Keep a bladder diary if told by your health care provider. Keep track of: What you eat and drink. How often you urinate. How much you urinate. Follow a bladder training program if told by your health care provider. This may include: Learning to delay going to the bathroom. Double urinating (voiding). This helps if you are not completely emptying your bladder. Scheduled voiding. Do Kegel exercises as told by your health care provider. Kegel exercises strengthen the muscles that help control urination, which may help the condition.  Eating and drinking If told by your health care provider, make diet changes, such as: Avoiding caffeine. Drinking fewer fluids, especially alcohol. Not drinking in the evening. Avoiding foods or drinks that may irritate the bladder. These include coffee, tea, soda, artificial sweeteners, citrus, tomato-based foods, and chocolate. Eating foods that help prevent or ease constipation. Constipation can make this condition worse. Your health care provider may recommend that you: Drink enough fluid to keep your urine pale yellow. Take over-the-counter or prescription medicines. Eat foods that are high in fiber, such as beans, whole grains, and fresh fruits and vegetables. Limit foods  that are high in fat and processed sugars, such as fried or sweet foods. General instructions Take over-the-counter and prescription medicines only as told by your health care provider. Keep all follow-up visits as told by your health care provider. This is important. Contact a health care provider if: You start urinating more often. You feel pain or irritation when you urinate. You notice blood in your urine. Your urine looks cloudy. You develop a fever. You begin vomiting. Get help right away if: You are unable to urinate. Summary Urinary frequency means urinating more often than usual. With urinary frequency, you may urinate every 1-2 hours even though you drink a normal amount of fluid and do not have a bladder infection or other bladder condition. Your health care provider may recommend that you keep a bladder diary, follow a bladder training program, or make dietary changes. If told by your health care provider, do Kegel exercises to strengthen the muscles that help control urination. Take over-the-counter and prescription medicines only as told by your health care provider. Contact a health care provider if your symptoms do not improve or get worse. This information is not intended to replace advice given to you by your health care provider. Make sure you discuss any questions you have with your healthcare provider. Document Revised: 03/11/2018 Document Reviewed: 03/11/2018 Elsevier Patient Education  2021 Elsevier Inc.  

## 2021-04-11 NOTE — Progress Notes (Addendum)
Virtual Visit via Telephone Note  I connected with Melessia Kaus [place] on 04/11/21 at  1:40 PM EDT by telephone and verified that I am speaking with the correct person using two identifiers.  Location: Patient: Home Provider: Office  Person's participating in this Telephone call: Webb Silversmith, NP, Lyman Speller and daughter [name].   I discussed the limitations of evaluation and management by telemedicine and the availability of in person appointments. The patient expressed understanding and agreed to proceed.  History of Present Illness:  Pt's daughter reports urinary frequency. She noticed this yesterday. She reports her mother has not complained of any urinary symptoms. They have not noticed any blood in her urine. She does not think she is running a fever. She finished up Bactrim yesterday for septic bursitis of the left elbow. She does have a history of bladder cancer.   Past Medical History:  Diagnosis Date   Bladder cancer ([place])    Diabetes mellitus without complication (HCC)    GERD (gastroesophageal reflux disease)    Hypertension    Lung cancer (HCC)    Thyroid disease     Current Outpatient Medications  Medication Sig Dispense Refill   apixaban (ELIQUIS) 2.5 MG TABS tablet Take 2 tablets (5 mg total) by mouth 2 (two) times daily. 60 tablet 0   atorvastatin (LIPITOR) 10 MG tablet TAKE 1 TABLET BY MOUTH ONCE A DAY 90 tablet 1   Blood Glucose Monitoring Suppl (TRUE FOCUS BLOOD GLUCOSE METER) DEVI 1 Device by Does not apply route daily. 1 each 1   busPIRone (BUSPAR) 5 MG tablet TAKE 1 TABLET BY MOUTH TWICE A DAY 60 tablet 1   Cholecalciferol (VITAMIN D3) 1000 UNITS CAPS Take 1 capsule by mouth daily. Reported on 11/15/2015     fluocinonide (LIDEX) 0.05 % external solution Apply 1 application topically as needed.     glucose blood (TRUE FOCUS BLOOD GLUCOSE STRIP) test strip Use as instructed 100 each 12   insulin glargine (LANTUS) 100 UNIT/ML injection Inject 0.07 mLs (7 Units total)  into the skin daily. (Patient taking differently: Inject 10 Units into the skin daily.) 10 mL 11   Lancets (ONETOUCH ULTRASOFT) lancets Use as instructed 100 each 12   levothyroxine (SYNTHROID) 88 MCG tablet TAKE 1 TAB BY MOUTH ONCE DAILY. TAKE ON AN EMPTY STOMACH WITH A GLASS OF WATER ATLEAST 30-60 MINUTES BEFORE BREAKFAST (Patient taking differently: Take 88 mcg by mouth daily before breakfast.) 90 tablet 0   memantine (NAMENDA) 10 MG tablet TAKE 1 TABLET BY MOUTH TWICE A DAY 180 tablet 0   metoprolol succinate (TOPROL-XL) 25 MG 24 hr tablet TAKE 1 TABLET BY MOUTH ONCE A DAY 90 tablet 0   Omega-3 Fatty Acids (FISH OIL) 1000 MG CAPS Take 1 capsule by mouth daily.     sulfamethoxazole-trimethoprim (BACTRIM DS) 800-160 MG tablet Take 1 tablet by mouth every 12 (twelve) hours for 14 days. 28 tablet 0   No current facility-administered medications for this visit.    Allergies  Allergen Reactions   Aricept [Donepezil Hcl] Diarrhea    Family History  Problem Relation Age of Onset   Stroke Mother    Heart disease Father    Cancer Sister        Pancreatic    Social History   Socioeconomic History   Marital status: Widowed    Spouse name: Not on file   Number of children: 2   Years of education: Not on file   Highest education level: Not  on file  Occupational History   Occupation: Part time Scientist, water quality  Tobacco Use   Smoking status: Never   Smokeless tobacco: Never  Vaping Use   Vaping Use: Never used  Substance and Sexual Activity   Alcohol use: No    Alcohol/week: 0.0 standard drinks   Drug use: No   Sexual activity: Not Currently  Other Topics Concern   Not on file  Social History Narrative   Right handed      Completed 12th grade      Lives alone has care givers    Social Determinants of Health   Financial Resource Strain: Not on file  Food Insecurity: Not on file  Transportation Needs: Not on file  Physical Activity: Not on file  Stress: Not on file  Social  Connections: Not on file  Intimate Partner Violence: Not on file     Constitutional: Denies fever, malaise, fatigue, headache or abrupt weight changes.  Respiratory: Denies difficulty breathing, shortness of breath, cough or sputum production.   Cardiovascular: Denies chest pain, chest tightness, palpitations or swelling in the hands or feet.  Gastrointestinal: Denies abdominal pain, bloating, constipation, diarrhea or blood in the stool.  GU: Pt's daughter reports urinary frequency. Denies urgency, pain with urination, burning sensation, blood in urine, odor or discharge.  No other specific complaints in a complete review of systems (except as listed in HPI above).    Observations/Objective: Wt Readings from Last 3 Encounters:  03/19/21 198 lb (89.8 kg)  02/20/21 198 lb 12.8 oz (90.2 kg)  02/07/21 199 lb 15.3 oz (90.7 kg)    General: In NAD. Neurological: Alert.  BMET    Component Value Date/Time   NA 132 (L) 04/08/2021 1511   NA 140 03/22/2013 0800   K 4.7 04/08/2021 1511   K 4.1 03/22/2013 0800   CL 102 04/08/2021 1511   CL 106 03/22/2013 0800   CO2 18 (L) 04/08/2021 1511   CO2 27 03/22/2013 0800   GLUCOSE 191 (H) 04/08/2021 1511   GLUCOSE 104 (H) 03/22/2013 0800   BUN 23 04/08/2021 1511   BUN 15 03/22/2013 0800   CREATININE 1.85 (H) 04/08/2021 1511   CALCIUM 9.5 04/08/2021 1511   CALCIUM 9.2 03/22/2013 0800   GFRNONAA >60 03/27/2021 0611   GFRNONAA 59 (L) 03/22/2013 0800   GFRAA >60 03/22/2013 0800    Lipid Panel     Component Value Date/Time   CHOL 126 02/08/2021 0504   TRIG 103 02/08/2021 0504   HDL 35 (L) 02/08/2021 0504   CHOLHDL 3.6 02/08/2021 0504   VLDL 21 02/08/2021 0504   LDLCALC 70 02/08/2021 0504    CBC    Component Value Date/Time   WBC 10.0 04/08/2021 1511   RBC 4.45 04/08/2021 1511   HGB 14.0 04/08/2021 1511   HGB 13.0 09/22/2012 0934   HCT 42.5 04/08/2021 1511   HCT 39.4 09/22/2012 0934   PLT 391 04/08/2021 1511   PLT 253  09/22/2012 0934   MCV 95.5 04/08/2021 1511   MCV 91 09/22/2012 0934   MCH 31.5 04/08/2021 1511   MCHC 32.9 04/08/2021 1511   RDW 12.1 04/08/2021 1511   RDW 13.0 09/22/2012 0934   LYMPHSABS 1,320 04/08/2021 1511   LYMPHSABS 1.7 09/22/2012 0934   MONOABS 1.3 (H) 03/22/2021 1600   MONOABS 0.7 09/22/2012 0934   EOSABS 290 04/08/2021 1511   EOSABS 0.2 09/22/2012 0934   BASOSABS 100 04/08/2021 1511   BASOSABS 0.1 09/22/2012 0934    Hgb A1C  Lab Results  Component Value Date   HGBA1C 9.4 (A) 02/05/2021        Assessment and Plan:  Urinary Frequency:  Urinalysis: 1+ leuks, 3+ blood Will send urine culture Push fluids RX for Macrobid 100 mg BID x 5 days  Will follow up after urine culture is back, return precautions discussed  Follow Up Instructions:    I discussed the assessment and treatment plan with the patient. The patient was provided an opportunity to ask questions and all were answered. The patient agreed with the plan and demonstrated an understanding of the instructions.   The patient was advised to call back or seek an in-person evaluation if the symptoms worsen or if the condition fails to improve as anticipated.  I provided 5:15 minutes of non-face-to-face time during this encounter.   Webb Silversmith, NP

## 2021-04-12 ENCOUNTER — Ambulatory Visit: Payer: Self-pay | Admitting: *Deleted

## 2021-04-12 ENCOUNTER — Telehealth: Payer: Self-pay | Admitting: Internal Medicine

## 2021-04-12 LAB — URINE CULTURE
MICRO NUMBER:: 12175405
SPECIMEN QUALITY:: ADEQUATE

## 2021-04-12 MED ORDER — CEPHALEXIN 250 MG PO CAPS: 250.0000 mg | ORAL_CAPSULE | Freq: Two times a day (BID) | ORAL | 0 refills | Status: DC

## 2021-04-12 NOTE — Telephone Encounter (Signed)
I spoke with Michelle Vang the patients daughter and confirmed that we received the mychart message and the phone call. I told the daughter that Michelle Vang only works in the afternoon in the office on Fridays.  I recommended that she continue to hold the Macrobid and  as soon as Michelle Vang response to the message I will give her a call back. She verbalize understanding, no questions or concerns.

## 2021-04-12 NOTE — Telephone Encounter (Signed)
D/c Macrobid, I have sent in Keflex. Can give Benadryl 25 mg for itching/rash

## 2021-04-12 NOTE — Telephone Encounter (Signed)
Patient's daughter-Penny([phone]) is calling to report patient started Macrobid last night for UTI- within 1-2 hours after first dose- itching began: under arms, forearms and thighs. Will hold medication until she hears from office- may need to change antibiotic.

## 2021-04-12 NOTE — Telephone Encounter (Signed)
Patient was diagnosed with UTI- she was started on Macrobid. Patient started medication last night- within 2 hours patient was scratching- under arms, thighs and forearm. Treated the itching last night with hydrocortisone cream. Daughter is reluctant to give another dose.  Reason for Disposition  [1] Caller has URGENT medicine question about med that PCP or specialist prescribed AND [2] triager unable to answer question  Answer Assessment - Initial Assessment Questions 1. NAME of MEDICATION: "What medicine are you calling about?"     Macrobid 2. QUESTION: "What is your question?" (e.g., double dose of medicine, side effect)     Side effect: itching 3. PRESCRIBING HCP: "Who prescribed it?" Reason: if prescribed by specialist, call should be referred to that group.     PCP 4. SYMPTOMS: "Do you have any symptoms?"     Itching arms/legs 5. SEVERITY: If symptoms are present, ask "Are they mild, moderate or severe?"     Mild/moderate 6. PREGNANCY:  "Is there any chance that you are pregnant?" "When was your last menstrual period?"     N/a  Protocols used: Medication Question Call-A-AH

## 2021-04-12 NOTE — Telephone Encounter (Signed)
Verbal given per [facility] request for Hospice evaluation.

## 2021-04-12 NOTE — Telephone Encounter (Signed)
[facility] for hospice, skilled nursing, PT/OT, social work and speech therapy as requested

## 2021-04-12 NOTE — Telephone Encounter (Signed)
The pt daughter [name] was notified of Rollene Fare recommendation. She verbalize understanding, no questions or concerns.

## 2021-04-12 NOTE — Addendum Note (Signed)
Addended by: Jearld Fenton on: 04/12/2021 11:06 AM   Modules accepted: Orders

## 2021-04-12 NOTE — Telephone Encounter (Signed)
Copied from [place] [id number]. Topic: Quick Communication - Home Health Verbal Orders >> Apr 12, 2021 12:53 PM Yvette Rack wrote: Caller/Agency: Tim with [facility] Number: [phone] - Ok to leave message Requesting OT/PT/Skilled Nursing/Social Work/Speech Therapy: Hospice evaluation Frequency:

## 2021-04-15 ENCOUNTER — Ambulatory Visit: Payer: Medicare Other | Admitting: Neurology

## 2021-05-09 ENCOUNTER — Other Ambulatory Visit: Payer: Self-pay | Admitting: Internal Medicine

## 2021-05-09 ENCOUNTER — Other Ambulatory Visit: Payer: Self-pay | Admitting: Neurology

## 2021-05-09 DIAGNOSIS — F0390 Unspecified dementia without behavioral disturbance: Secondary | ICD-10-CM

## 2021-05-09 DIAGNOSIS — F03B0 Unspecified dementia, moderate, without behavioral disturbance, psychotic disturbance, mood disturbance, and anxiety: Secondary | ICD-10-CM

## 2021-05-09 NOTE — Telephone Encounter (Signed)
  Notes to clinic:  The original prescription was discontinued on 02/14/2021 by Annita Brod, MD for the following reason: Stop Taking at Discharge. Renewing this prescription may not be appropriate   Requested Prescriptions  Pending Prescriptions Disp Refills   glipiZIDE (GLUCOTROL XL) 5 MG 24 hr tablet [Pharmacy Med Name: GLIPIZIDE ER 5 MG TAB] 180 tablet 0    Sig: TAKE 2 TABLETS BY MOUTH ONCE A DAY WITH BREAKFAST     Endocrinology:  Diabetes - Sulfonylureas Failed - 05/09/2021  9:57 AM      Failed - HBA1C is between 0 and 7.9 and within 180 days    Hemoglobin A1C  Date Value Ref Range Status  02/05/2021 9.4 (A) 4.0 - 5.6 % Final   Hgb A1c MFr Bld  Date Value Ref Range Status  08/16/2020 8.2 (H) 4.6 - 6.5 % Final    Comment:    Glycemic Control Guidelines for People with Diabetes:Non Diabetic:  <6%Goal of Therapy: <7%Additional Action Suggested:  >8%           Passed - Valid encounter within last 6 months    Recent Outpatient Visits           4 weeks ago Urinary frequency   [facility] [place], Coralie Keens, NP   1 month ago Septic bursitis of elbow, left   [facility] [place], [place], NP   1 month ago Left arm pain   [facility] [place], [place] W, NP   2 months ago Type 2 diabetes mellitus with hyperglycemia, without long-term current use of insulin [facility])   [facility] [place], [place] W, NP   3 months ago Atrial fibrillation with rapid ventricular response [facility])   [facility] [place], Coralie Keens, NP       Future Appointments             In 1 month [place], Coralie Keens, NP [facility]

## 2021-05-16 ENCOUNTER — Other Ambulatory Visit: Payer: Self-pay | Admitting: Internal Medicine

## 2021-05-16 NOTE — Telephone Encounter (Signed)
Valid encounter. Future visit in 1 month

## 2021-06-04 ENCOUNTER — Other Ambulatory Visit: Payer: Self-pay | Admitting: Internal Medicine

## 2021-06-25 ENCOUNTER — Ambulatory Visit: Payer: Medicare Other | Admitting: Internal Medicine

## 2021-07-01 ENCOUNTER — Telehealth: Payer: Self-pay | Admitting: Internal Medicine

## 2021-07-01 ENCOUNTER — Encounter: Payer: Self-pay | Admitting: Internal Medicine

## 2021-07-01 MED ORDER — INSULIN GLARGINE 100 UNIT/ML ~~LOC~~ SOLN: 10.0000 [IU] | Freq: Every day | SUBCUTANEOUS | 2 refills | Status: DC

## 2021-07-01 MED ORDER — INSULIN GLARGINE 100 UNIT/ML SOLOSTAR PEN: 10.0000 [IU] | PEN_INJECTOR | Freq: Every day | SUBCUTANEOUS | 0 refills | Status: DC

## 2021-07-01 NOTE — Telephone Encounter (Signed)
Michelle Vang with [facility] states the pt is getting the Lantus Solar Pens (and not the vials) The directions for .10 is correct.  Can you send the Pens please?   [place], [place]

## 2021-07-01 NOTE — Telephone Encounter (Signed)
I sent a message to the patient's daughter asking if she was taking the vials versus the pens because the vials is what was listed on her medication list.  I will send in the pens.

## 2021-07-01 NOTE — Addendum Note (Signed)
Addended by: Jearld Fenton on: 07/01/2021 02:33 PM   Modules accepted: Orders

## 2021-07-18 ENCOUNTER — Encounter: Payer: Self-pay | Admitting: Internal Medicine

## 2021-07-31 ENCOUNTER — Other Ambulatory Visit: Payer: Self-pay | Admitting: Internal Medicine

## 2021-07-31 NOTE — Telephone Encounter (Signed)
Requested Prescriptions  Pending Prescriptions Disp Refills  . atorvastatin (LIPITOR) 10 MG tablet [Pharmacy Med Name: ATORVASTATIN CALCIUM 10 MG TAB] 90 tablet 1    Sig: TAKE 1 TABLET BY MOUTH ONCE A DAY     Cardiovascular:  Antilipid - Statins Failed - 07/31/2021  5:13 PM      Failed - HDL in normal range and within 360 days    HDL  Date Value Ref Range Status  02/08/2021 35 (L) >40 mg/dL Final         Passed - Total Cholesterol in normal range and within 360 days    Cholesterol  Date Value Ref Range Status  02/08/2021 126 0 - 200 mg/dL Final         Passed - LDL in normal range and within 360 days    LDL Cholesterol  Date Value Ref Range Status  02/08/2021 70 0 - 99 mg/dL Final    Comment:           Total Cholesterol/HDL:CHD Risk Coronary Heart Disease Risk Table                     Men   Women  1/2 Average Risk   3.4   3.3  Average Risk       5.0   4.4  2 X Average Risk   9.6   7.1  3 X Average Risk  23.4   11.0        Use the calculated Patient Ratio above and the CHD Risk Table to determine the patient's CHD Risk.        ATP III CLASSIFICATION (LDL):  <100     mg/dL   Optimal  100-129  mg/dL   Near or Above                    Optimal  130-159  mg/dL   Borderline  160-189  mg/dL   High  >190     mg/dL   Very High Performed at [facility], [address] [place], [place] [address]    Direct LDL  Date Value Ref Range Status  05/10/2020 143.0 mg/dL Final    Comment:    Optimal:  <100 mg/dLNear or Above Optimal:  100-129 mg/dLBorderline High:  130-159 mg/dLHigh:  160-189 mg/dLVery High:  >190 mg/dL         Passed - Triglycerides in normal range and within 360 days    Triglycerides  Date Value Ref Range Status  02/08/2021 103 <150 mg/dL Final         Passed - Patient is not pregnant      Passed - Valid encounter within last 12 months    Recent Outpatient Visits          3 months ago Urinary frequency   [facility] [place],  Coralie Keens, NP   3 months ago Septic bursitis of elbow, left   [facility] [place], Coralie Keens, NP   4 months ago Left arm pain   [facility] [place], [place] W, NP   5 months ago Type 2 diabetes mellitus with hyperglycemia, without long-term current use of insulin [facility])   [facility] [place], Coralie Keens, NP   5 months ago Atrial fibrillation with rapid ventricular response [facility])   [facility] [place], [place] W, NP             . metoprolol succinate (TOPROL-XL) 25  MG 24 hr tablet [Pharmacy Med Name: METOPROLOL SUCCINATE ER 25 MG TAB] 90 tablet 0    Sig: TAKE 1 TABLET BY MOUTH ONCE A DAY     Cardiovascular:  Beta Blockers Passed - 07/31/2021  5:13 PM      Passed - Last BP in normal range    BP Readings from Last 1 Encounters:  04/08/21 101/62         Passed - Last Heart Rate in normal range    Pulse Readings from Last 1 Encounters:  04/08/21 100         Passed - Valid encounter within last 6 months    Recent Outpatient Visits          3 months ago Urinary frequency   [facility] [place], [place] W, NP   3 months ago Septic bursitis of elbow, left   [facility] [place], Coralie Keens, NP   4 months ago Left arm pain   [facility] [place], [place] W, NP   5 months ago Type 2 diabetes mellitus with hyperglycemia, without long-term current use of insulin [facility])   [facility] [place], Coralie Keens, NP   5 months ago Atrial fibrillation with rapid ventricular response [facility]   [facility] [place], Coralie Keens, NP

## 2021-08-06 ENCOUNTER — Other Ambulatory Visit: Payer: Self-pay | Admitting: Neurology

## 2021-08-06 DIAGNOSIS — F03B0 Unspecified dementia, moderate, without behavioral disturbance, psychotic disturbance, mood disturbance, and anxiety: Secondary | ICD-10-CM

## 2021-08-27 ENCOUNTER — Ambulatory Visit (INDEPENDENT_AMBULATORY_CARE_PROVIDER_SITE_OTHER): Payer: Medicare Other | Admitting: Internal Medicine

## 2021-08-27 ENCOUNTER — Ambulatory Visit: Payer: Medicare Other | Admitting: Internal Medicine

## 2021-08-27 ENCOUNTER — Encounter: Payer: Self-pay | Admitting: Internal Medicine

## 2021-08-27 ENCOUNTER — Other Ambulatory Visit: Payer: Self-pay

## 2021-08-27 VITALS — BP 104/71 | HR 81 | Temp 98.0°F | Resp 18 | Ht 63.0 in

## 2021-08-27 DIAGNOSIS — E7800 Pure hypercholesterolemia, unspecified: Secondary | ICD-10-CM

## 2021-08-27 DIAGNOSIS — Z0289 Encounter for other administrative examinations: Secondary | ICD-10-CM

## 2021-08-27 DIAGNOSIS — Z6835 Body mass index (BMI) 35.0-35.9, adult: Secondary | ICD-10-CM

## 2021-08-27 DIAGNOSIS — E039 Hypothyroidism, unspecified: Secondary | ICD-10-CM

## 2021-08-27 DIAGNOSIS — I4819 Other persistent atrial fibrillation: Secondary | ICD-10-CM | POA: Diagnosis not present

## 2021-08-27 DIAGNOSIS — I152 Hypertension secondary to endocrine disorders: Secondary | ICD-10-CM

## 2021-08-27 DIAGNOSIS — E1159 Type 2 diabetes mellitus with other circulatory complications: Secondary | ICD-10-CM

## 2021-08-27 DIAGNOSIS — N1831 Chronic kidney disease, stage 3a: Secondary | ICD-10-CM

## 2021-08-27 DIAGNOSIS — E119 Type 2 diabetes mellitus without complications: Secondary | ICD-10-CM

## 2021-08-27 DIAGNOSIS — Z8673 Personal history of transient ischemic attack (TIA), and cerebral infarction without residual deficits: Secondary | ICD-10-CM

## 2021-08-27 DIAGNOSIS — Z794 Long term (current) use of insulin: Secondary | ICD-10-CM

## 2021-08-27 DIAGNOSIS — K219 Gastro-esophageal reflux disease without esophagitis: Secondary | ICD-10-CM

## 2021-08-27 DIAGNOSIS — G3184 Mild cognitive impairment, so stated: Secondary | ICD-10-CM

## 2021-08-27 NOTE — Patient Instructions (Signed)
Dementia Caregiver Guide Dementia is a term used to describe a number of symptoms that affect memory and thinking. The most common symptoms include: Memory loss. Trouble with language and communication. Trouble concentrating. Poor judgment and problems with reasoning. Wandering from home or public places. Extreme anxiety or depression. Being suspicious or having angry outbursts and accusations. Child-like behavior and language. Dementia can be frightening and confusing. And taking care of someone with dementia can be challenging. This guide provides tips to help you when providing care for a person with dementia. How to help manage lifestyle changes Dementia usually gets worse slowly over time. In the early stages, people with dementia can stay independent and safe with some help. In later stages, they need help with daily tasks such as dressing, grooming, and using the bathroom. There are actions you can take to help a person manage his or her life while living with this condition. Communicating When the person is talking or seems frustrated, make eye contact and hold the person's hand. Ask specific questions that need yes or no answers. Use simple words, short sentences, and a calm voice. Only give one direction at a time. When offering choices, limit the person to just one or two. Avoid correcting the person in a negative way. If the person is struggling to find the right words, gently try to help him or her. Preventing injury  Keep floors clear of clutter. Remove rugs, magazine racks, and floor lamps. Keep hallways well lit, especially at night. Put a handrail and nonslip mat in the bathtub or shower. Put childproof locks on cabinets that contain dangerous items, such as medicines, alcohol, guns, toxic cleaning items, sharp tools or utensils, matches, and lighters. For doors to the outside of the house, put the locks in places where the person cannot see or reach them easily. This will  help ensure that the person does not wander out of the house and get lost. Be prepared for emergencies. Keep a list of emergency phone numbers and addresses in a convenient area. Remove car keys and lock garage doors so that the person does not try to get in the car and drive. Have the person wear a bracelet that tracks locations and identifies the person as having memory problems. This should be worn at all times for safety. Helping with daily life  Keep the person on track with his or her routine. Try to identify areas where the person may need help. Be supportive, patient, calm, and encouraging. Gently remind the person that adjusting to changes takes time. Help with the tasks that the person has asked for help with. Keep the person involved in daily tasks and decisions as much as possible. Encourage conversation, but try not to get frustrated if the person struggles to find words or does not seem to appreciate your help. How to recognize stress Look for signs of stress in yourself and in the person you are caring for. If you notice signs of stress, take steps to manage it. Symptoms of stress include: Feeling anxious, irritable, frustrated, or angry. Denying that the person has dementia or that his or her symptoms will not improve. Feeling depressed, hopeless, or unappreciated. Difficulty sleeping. Difficulty concentrating. Developing stress-related health problems. Feeling like you have too little time for your own life. Follow these instructions at home: Take care of your health Make sure that you and the person you are caring for: Get regular sleep. Exercise regularly. Eat regular, nutritious meals. Take over-the-counter and prescription medicines only  as told by your health care providers. Drink enough fluid to keep your urine pale yellow. Attend all scheduled health care appointments.  General instructions Join a support group with others who are caregivers. Ask about  respite care resources. Respite care can provide short-term care for the person so that you can have a regular break from the stress of caregiving. Consider any safety risks and take steps to avoid them. Organize medicines in a pill box for each day of the week. Create a plan to handle any legal or financial matters. Get legal or financial advice if needed. Keep a calendar in a central location to remind the person of appointments or other activities. Where to find support: Many individuals and organizations offer support. These include: Support groups for people with dementia. Support groups for caregivers. Counselors or therapists. Home health care services. Adult day care centers. Where to find more information Centers for Disease Control and Prevention: http://www.wolf.info/ Alzheimer's Association: [place].co.nz Family Caregiver Alliance: www.caregiver.[place]: www.alzfdn.org Contact a health care provider if: The person's health is rapidly getting worse. You are no longer able to care for the person. Caring for the person is affecting your physical and emotional health. You are feeling depressed or anxious about caring for the person. Get help right away if: The person threatens himself or herself, you, or anyone else. You feel depressed or sad, or feel that you want to harm yourself. If you ever feel like your loved one may hurt himself or herself or others, or if he or she shares thoughts about taking his or her own life, get help right away. You can go to your nearest emergency department or: Call your local emergency services (911 in the [place] Call a suicide crisis helpline, such as the [place] at (256)441-2805 or 988 in the [place]. This is open 24 hours a day in the [place] Text the Crisis Text Line at 321-280-6966 (in the [place].). Summary Dementia is a term used to describe a number of symptoms that affect memory and thinking. Dementia  usually gets worse slowly over time. Take steps to reduce the person's risk of injury and to plan for future care. Caregivers need support, relief from caregiving, and time for their own lives. This information is not intended to replace advice given to you by your health care provider. Make sure you discuss any questions you have with your health care provider. Document Revised: 03/27/2021 Document Reviewed: 01/16/2020 Elsevier Patient Education  [place].

## 2021-08-27 NOTE — Assessment & Plan Note (Signed)
C-Met today Encourage adequate water intake

## 2021-08-27 NOTE — Assessment & Plan Note (Signed)
C-Met and lipid profile today Encouraged her to consume a low-fat diet Continue Atorvastatin, Metoprolol and Eliquis

## 2021-08-27 NOTE — Assessment & Plan Note (Signed)
Continue Namenda and Trazodone Plan is to transition to SNF [place] 2 form completed today

## 2021-08-27 NOTE — Assessment & Plan Note (Signed)
C-Met and lipid profile today Encouraged her to consume a low-fat diet Continue Atorvastatin 

## 2021-08-27 NOTE — Progress Notes (Signed)
Subjective:    Patient ID: Michelle Vang, female    DOB: 09/21/1928, 87 y.o.   MRN: [id number]  HPI  Pt presents to the clinic today accompanied by her daughter for [facility] form completion. She has a history of HTN, GERD, HLD s/p CVA, Afib, Hypothyroidism, MCI, DM 2 with CKD.  She has been at home with caregivers during the day.  She is planning to go to [employer].  Her daughter reports she has had some difficulty sleeping, wandering which has improved with trazodone.  She is confused but not agitated or combative.  She is able to feed herself.  She walks with a walker.  Her daughter denies recent falls.  She reports her bowels and bladder are moving normally without incontinence.  GERD: Currently not an issue. She is not currently taking any medications for this. There is no upper GI on file.  HTN: Her BP today is 104/71. She is taking Metoprolol as prescribed. ECG from 03/2021 reviewed  HLD with Hx of CVA: Her last LDL was, triglycerides. She denies myalgias on Atorvastatin. She is taking Fish Oil, Eliquis and Metoprolol as prescribed.  Afib: Chronic, managed on Metoprolol and Eliquis. ECG from 03/2021 reviewed.  DM 2: Her last A1C was 9.4%, 01/2021. Her sugars are generally < 200. She is taking Lantus as prescribed. She checks her feet routinely. Flu 05/2021. Pneumovax 11/2017. Prevnar 10/2016. Covid Pfizer x 2.  Hypothyroidism: She denies any issues on her current dose of Levothyroxine. She does not follow with endocrinology.  MCI: Mild cognitive, moderate functional needs. She is taking Namenda as prescribed. She is using Trazadone for sleep.   Review of Systems     Past Medical History:  Diagnosis Date   Bladder cancer ([place])    Diabetes mellitus without complication (HCC)    GERD (gastroesophageal reflux disease)    Hypertension    Lung cancer (HCC)    Thyroid disease     Current Outpatient Medications  Medication Sig Dispense Refill   apixaban (ELIQUIS) 2.5 MG TABS tablet  Take 2 tablets (5 mg total) by mouth 2 (two) times daily. 60 tablet 0   atorvastatin (LIPITOR) 10 MG tablet TAKE 1 TABLET BY MOUTH ONCE A DAY 90 tablet 1   Blood Glucose Monitoring Suppl (TRUE FOCUS BLOOD GLUCOSE METER) DEVI 1 Device by Does not apply route daily. 1 each 1   busPIRone (BUSPAR) 5 MG tablet TAKE 1 TABLET BY MOUTH TWICE A DAY 60 tablet 1   cephALEXin (KEFLEX) 250 MG capsule Take 1 capsule (250 mg total) by mouth 2 (two) times daily. 10 capsule 0   Cholecalciferol (VITAMIN D3) 1000 UNITS CAPS Take 1 capsule by mouth daily. Reported on 11/15/2015     fluocinonide (LIDEX) 0.05 % external solution Apply 1 application topically as needed.     glucose blood (TRUE FOCUS BLOOD GLUCOSE STRIP) test strip Use as instructed 100 each 12   insulin glargine (LANTUS) 100 UNIT/ML Solostar Pen Inject 10 Units into the skin daily. 10 mL 0   Lancets (ONETOUCH ULTRASOFT) lancets Use as instructed 100 each 12   levothyroxine (SYNTHROID) 88 MCG tablet TAKE 1 TAB BY MOUTH ONCE DAILY. TAKE ON AN EMPTY STOMACH WITH A GLASS OF WATER ATLEAST 30-60 MINUTES BEFORE BREAKFAST (Patient taking differently: Take 88 mcg by mouth daily before breakfast.) 90 tablet 0   memantine (NAMENDA) 10 MG tablet TAKE 1 TABLET BY MOUTH TWICE A DAY 180 tablet 0   metoprolol succinate (TOPROL-XL) 25 MG  24 hr tablet TAKE 1 TABLET BY MOUTH ONCE A DAY 90 tablet 0   Omega-3 Fatty Acids (FISH OIL) 1000 MG CAPS Take 1 capsule by mouth daily.     No current facility-administered medications for this visit.    Allergies  Allergen Reactions   Aricept [Donepezil Hcl] Diarrhea    Family History  Problem Relation Age of Onset   Stroke Mother    Heart disease Father    Cancer Sister        Pancreatic    Social History   Socioeconomic History   Marital status: Widowed    Spouse name: Not on file   Number of children: 2   Years of education: Not on file   Highest education level: Not on file  Occupational History   Occupation:  Part time Scientist, water quality  Tobacco Use   Smoking status: Never   Smokeless tobacco: Never  Vaping Use   Vaping Use: Never used  Substance and Sexual Activity   Alcohol use: No    Alcohol/week: 0.0 standard drinks   Drug use: No   Sexual activity: Not Currently  Other Topics Concern   Not on file  Social History Narrative   Right handed      Completed 12th grade      Lives alone has care givers    Social Determinants of Health   Financial Resource Strain: Not on file  Food Insecurity: Not on file  Transportation Needs: Not on file  Physical Activity: Not on file  Stress: Not on file  Social Connections: Not on file  Intimate Partner Violence: Not on file     Constitutional: Denies fever, malaise, fatigue, headache or abrupt weight changes.  HEENT: Pt reports difficulty hearing. Denies eye pain, eye redness, ear pain, ringing in the ears, wax buildup, runny nose, nasal congestion, bloody nose, or sore throat. Respiratory: Denies difficulty breathing, shortness of breath, cough or sputum production.   Cardiovascular: Denies chest pain, chest tightness, palpitations or swelling in the hands or feet.  Gastrointestinal: Denies abdominal pain, bloating, constipation, diarrhea or blood in the stool.  GU: Denies urgency, frequency, pain with urination, burning sensation, blood in urine, odor or discharge. Musculoskeletal: Pt reports difficulty with gait. Denies decrease in range of motion, muscle pain or joint pain and swelling.  Skin: Denies redness, rashes, lesions or ulcercations.  Neurological: Pt reports difficulty with memory, insomnia. Denies dizziness, difficulty with speech or problems with balance and coordination.  Psych: Denies anxiety, depression, SI/HI.  No other specific complaints in a complete review of systems (except as listed in HPI above).  Objective:   Physical Exam BP 104/71 (BP Location: Right Arm, Patient Position: Sitting, Cuff Size: Normal)   Pulse 81   Temp  98 F (36.7 C) (Temporal)   Resp 18   Ht 5\' 3"  (1.6 m)   SpO2 100%   BMI 35.07 kg/m   Wt Readings from Last 3 Encounters:  03/19/21 198 lb (89.8 kg)  02/20/21 198 lb 12.8 oz (90.2 kg)  02/07/21 199 lb 15.3 oz (90.7 kg)    General: Appears her stated age, obese, in NAD. Skin: Warm, dry and intact.  HEENT: Head: normal shape and size; Eyes: sclera white and EOMs intact;  Cardiovascular: Normal rate and rhythm. S1,S2 noted.  No murmur, rubs or gallops noted. No JVD or BLE edema.  Pulmonary/Chest: Normal effort and positive vesicular breath sounds. No respiratory distress. No wheezes, rales or ronchi noted.  Musculoskeletal: In wheelchair today. Neurological: Alert  and oriented to person. Psychiatric: Mood and affect normal. Behavior is normal. Judgment and thought content normal.     BMET    Component Value Date/Time   NA 132 (L) 04/08/2021 1511   NA 140 03/22/2013 0800   K 4.7 04/08/2021 1511   K 4.1 03/22/2013 0800   CL 102 04/08/2021 1511   CL 106 03/22/2013 0800   CO2 18 (L) 04/08/2021 1511   CO2 27 03/22/2013 0800   GLUCOSE 191 (H) 04/08/2021 1511   GLUCOSE 104 (H) 03/22/2013 0800   BUN 23 04/08/2021 1511   BUN 15 03/22/2013 0800   CREATININE 1.85 (H) 04/08/2021 1511   CALCIUM 9.5 04/08/2021 1511   CALCIUM 9.2 03/22/2013 0800   GFRNONAA >60 03/27/2021 0611   GFRNONAA 59 (L) 03/22/2013 0800   GFRAA >60 03/22/2013 0800    Lipid Panel     Component Value Date/Time   CHOL 126 02/08/2021 0504   TRIG 103 02/08/2021 0504   HDL 35 (L) 02/08/2021 0504   CHOLHDL 3.6 02/08/2021 0504   VLDL 21 02/08/2021 0504   LDLCALC 70 02/08/2021 0504    CBC    Component Value Date/Time   WBC 10.0 04/08/2021 1511   RBC 4.45 04/08/2021 1511   HGB 14.0 04/08/2021 1511   HGB 13.0 09/22/2012 0934   HCT 42.5 04/08/2021 1511   HCT 39.4 09/22/2012 0934   PLT 391 04/08/2021 1511   PLT 253 09/22/2012 0934   MCV 95.5 04/08/2021 1511   MCV 91 09/22/2012 0934   MCH 31.5 04/08/2021  1511   MCHC 32.9 04/08/2021 1511   RDW 12.1 04/08/2021 1511   RDW 13.0 09/22/2012 0934   LYMPHSABS 1,320 04/08/2021 1511   LYMPHSABS 1.7 09/22/2012 0934   MONOABS 1.3 (H) 03/22/2021 1600   MONOABS 0.7 09/22/2012 0934   EOSABS 290 04/08/2021 1511   EOSABS 0.2 09/22/2012 0934   BASOSABS 100 04/08/2021 1511   BASOSABS 0.1 09/22/2012 0934    Hgb A1C Lab Results  Component Value Date   HGBA1C 9.4 (A) 02/05/2021          Assessment & Plan:   Encounter for Form Completion:  FL2 form filled out, copy scanned into chart, original given to patients daughter  Return precautions discussed Webb Silversmith, NP This visit occurred during the SARS-CoV-2 public health emergency.  Safety protocols were in place, including screening questions prior to the visit, additional usage of staff PPE, and extensive cleaning of exam room while observing appropriate contact time as indicated for disinfecting solutions.

## 2021-08-27 NOTE — Assessment & Plan Note (Signed)
Controlled on Metoprolol Will monitor

## 2021-08-27 NOTE — Assessment & Plan Note (Signed)
Encourage diet and exercise for weight loss 

## 2021-08-27 NOTE — Assessment & Plan Note (Signed)
A1c and urine microalbumin today Encouraged her to consume a low-carb diet Continue Lantus On palliative care, no need for eye exams Encourage routine foot exam Flu and pneumonia vaccines UTD Encouraged her to get a COVID booster

## 2021-08-27 NOTE — Assessment & Plan Note (Signed)
TSH and free T4 today Will adjust Levothyroxine if needed based on labs

## 2021-08-27 NOTE — Assessment & Plan Note (Signed)
No issues off meds

## 2021-08-27 NOTE — Assessment & Plan Note (Signed)
Continue Metoprolol and Eliquis

## 2021-08-28 LAB — COMPLETE METABOLIC PANEL WITH GFR
AG Ratio: 1.1 (calc) (ref 1.0–2.5)
ALT: 22 U/L (ref 6–29)
AST: 25 U/L (ref 10–35)
Albumin: 3.5 g/dL — ABNORMAL LOW (ref 3.6–5.1)
Alkaline phosphatase (APISO): 146 U/L (ref 37–153)
BUN/Creatinine Ratio: 22 (calc) (ref 6–22)
BUN: 25 mg/dL (ref 7–25)
CO2: 27 mmol/L (ref 20–32)
Calcium: 9.6 mg/dL (ref 8.6–10.4)
Chloride: 104 mmol/L (ref 98–110)
Creat: 1.12 mg/dL — ABNORMAL HIGH (ref 0.60–0.95)
Globulin: 3.1 g/dL (calc) (ref 1.9–3.7)
Glucose, Bld: 158 mg/dL — ABNORMAL HIGH (ref 65–139)
Potassium: 5 mmol/L (ref 3.5–5.3)
Sodium: 139 mmol/L (ref 135–146)
Total Bilirubin: 0.6 mg/dL (ref 0.2–1.2)
Total Protein: 6.6 g/dL (ref 6.1–8.1)
eGFR: 47 mL/min/{1.73_m2} — ABNORMAL LOW (ref >=60)

## 2021-08-28 LAB — CBC
HCT: 42.8 % (ref 35.0–45.0)
Hemoglobin: 14.4 g/dL (ref 11.7–15.5)
MCH: 32.2 pg (ref 27.0–33.0)
MCHC: 33.6 g/dL (ref 32.0–36.0)
MCV: 95.7 fL (ref 80.0–100.0)
MPV: 10.2 fL (ref 7.5–12.5)
Platelets: 266 10*3/uL (ref 140–400)
RBC: 4.47 10*6/uL (ref 3.80–5.10)
RDW: 12 % (ref 11.0–15.0)
WBC: 7.9 10*3/uL (ref 3.8–10.8)

## 2021-08-28 LAB — LIPID PANEL
Cholesterol: 145 mg/dL (ref <200)
HDL: 39 mg/dL — ABNORMAL LOW (ref >=50)
LDL Cholesterol (Calc): 74 mg/dL (calc)
Non-HDL Cholesterol (Calc): 106 mg/dL (calc) (ref <130)
Total CHOL/HDL Ratio: 3.7 (calc) (ref <5.0)
Triglycerides: 228 mg/dL — ABNORMAL HIGH (ref <150)

## 2021-08-28 LAB — HEMOGLOBIN A1C
Hgb A1c MFr Bld: 8.6 % of total Hgb — ABNORMAL HIGH (ref <5.7)
Mean Plasma Glucose: 200 mg/dL
eAG (mmol/L): 11.1 mmol/L

## 2021-08-28 LAB — MICROALBUMIN / CREATININE URINE RATIO
Creatinine, Urine: 53 mg/dL (ref 20–275)
Microalb Creat Ratio: 9 mcg/mg creat (ref <30)
Microalb, Ur: 0.5 mg/dL

## 2021-08-28 LAB — T4, FREE: Free T4: 1.2 ng/dL (ref 0.8–1.8)

## 2021-08-28 LAB — TSH: TSH: 4.82 mIU/L — ABNORMAL HIGH (ref 0.40–4.50)

## 2021-08-29 ENCOUNTER — Encounter: Payer: Self-pay | Admitting: Internal Medicine

## 2021-09-25 ENCOUNTER — Other Ambulatory Visit: Payer: Self-pay | Admitting: Internal Medicine

## 2021-09-25 NOTE — Telephone Encounter (Signed)
Medication discontinued on 08/27/21.

## 2021-10-25 ENCOUNTER — Other Ambulatory Visit: Payer: Self-pay | Admitting: Internal Medicine

## 2021-10-25 NOTE — Telephone Encounter (Signed)
Requested medication (s) are due for refill today: Unknown  Requested medication (s) are on the active medication list: No  Last refill:  Unknown  Future visit scheduled: No  Notes to clinic:  Not on medication list.    Requested Prescriptions  Pending Prescriptions Disp Refills   busPIRone (BUSPAR) 5 MG tablet [Pharmacy Med Name: BUSPIRONE HCL 5 MG TAB] 60 tablet     Sig: TAKE 1 TABLET BY MOUTH TWICE A DAY     Psychiatry: Anxiolytics/Hypnotics - Non-controlled Passed - 10/25/2021 10:41 AM      Passed - Valid encounter within last 12 months    Recent Outpatient Visits           1 month ago Acquired hypothyroidism   [facility] [place], Coralie Keens, NP   6 months ago Urinary frequency   [facility] [place], [place], NP   6 months ago Septic bursitis of elbow, left   [facility] [place], [place], NP   7 months ago Left arm pain   [facility] [place], [place], NP   8 months ago Type 2 diabetes mellitus with hyperglycemia, without long-term current use of insulin [facility])   [facility] [place], Coralie Keens, NP

## 2021-11-22 ENCOUNTER — Other Ambulatory Visit: Payer: Self-pay | Admitting: Internal Medicine

## 2021-11-22 ENCOUNTER — Other Ambulatory Visit: Payer: Self-pay | Admitting: Neurology

## 2021-11-22 DIAGNOSIS — F03B0 Unspecified dementia, moderate, without behavioral disturbance, psychotic disturbance, mood disturbance, and anxiety: Secondary | ICD-10-CM

## 2021-11-22 NOTE — Telephone Encounter (Signed)
Requested Prescriptions  ?Pending Prescriptions Disp Refills  ?? metoprolol succinate (TOPROL-XL) 25 MG 24 hr tablet [Pharmacy Med Name: METOPROLOL SUCCINATE ER 25 MG TAB] 90 tablet 0  ?  Sig: TAKE 1 TABLET BY MOUTH ONCE A DAY  ?  ? Cardiovascular:  Beta Blockers Passed - 11/22/2021 12:47 PM  ?  ?  Passed - Last BP in normal range  ?  BP Readings from Last 1 Encounters:  ?08/27/21 104/71  ?   ?  ?  Passed - Last Heart Rate in normal range  ?  Pulse Readings from Last 1 Encounters:  ?08/27/21 81  ?   ?  ?  Passed - Valid encounter within last 6 months  ?  Recent Outpatient Visits   ?      ? 2 months ago Acquired hypothyroidism  ? [place], NP  ? 7 months ago Urinary frequency  ? [facility] [place], [place], NP  ? 7 months ago Septic bursitis of elbow, left  ? [facility] [place], [place] W, NP  ? 8 months ago Left arm pain  ? [facility] [place], [place] W, NP  ? 9 months ago Type 2 diabetes mellitus with hyperglycemia, without long-term current use of insulin ([place])  ? [facility] [place], Coralie Keens, NP  ?  ?  ? ?  ?  ?  ? ?

## 2022-03-08 IMAGING — DX DG CHEST 1V PORT
1 series · 1 of 1 positions shown · non-contrast
Comparison: None.

CLINICAL DATA: Sepsis

EXAM:
PORTABLE CHEST 1 VIEW

[chest ap]
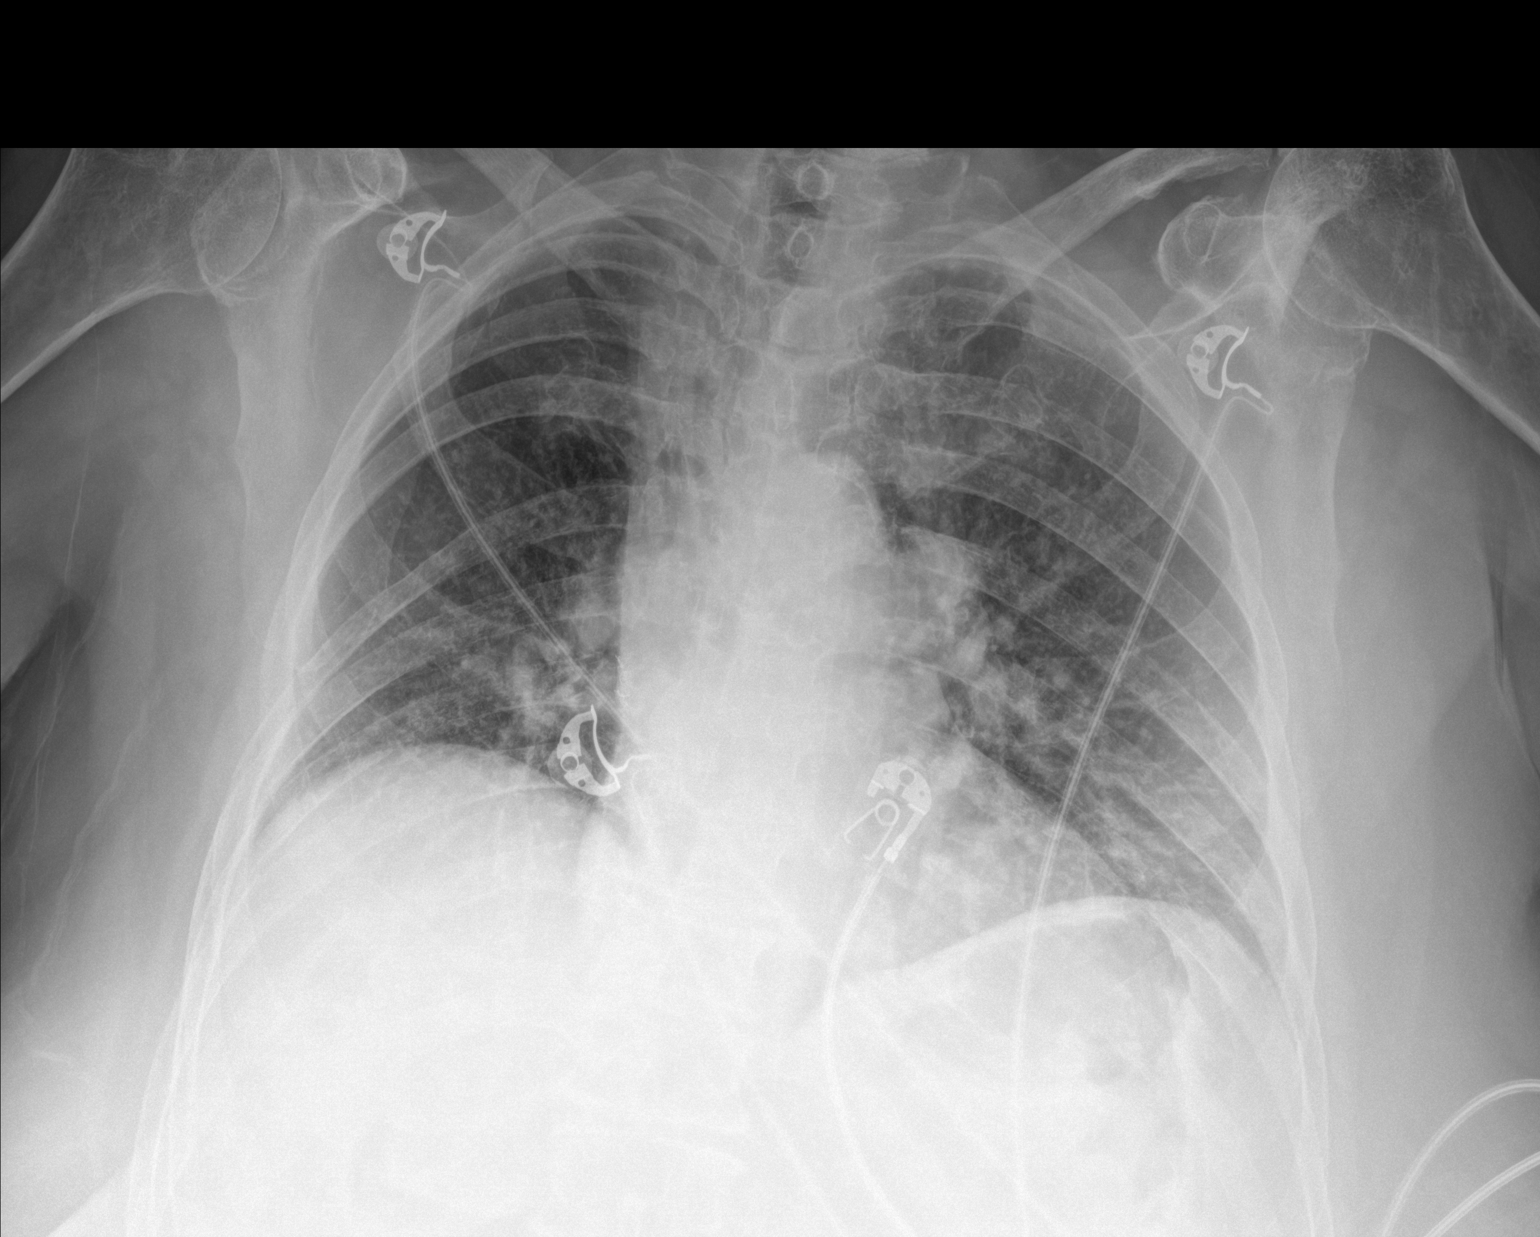

[1 of 1 positions shown; findings below may reference images not displayed]

FINDINGS: The heart size and mediastinal contours are unchanged. Aortic
calcification.

Biapical pleural/pulmonary scarring. No focal consolidation. No
pulmonary edema. No pleural effusion. No pneumothorax.

No acute osseous abnormality.
IMPRESSION: No active disease.

## 2022-03-08 IMAGING — CT CT HEAD CODE STROKE
3 series · 15 of 47 positions shown, 18 images · non-contrast
Comparison: MRI February 08, 2021.  CT head February 07, 2021.

CLINICAL DATA: Code stroke.

EXAM:
CT HEAD WITHOUT CONTRAST
TECHNIQUE: Contiguous axial images were obtained from the base of the skull
through the vertex without intravenous contrast.

[Series 3: head wo · axial · 0.42mm/px · z∈[+168,+318]mm · 9 of 36 slices shown, 12 images]
[im 3/36  brain]
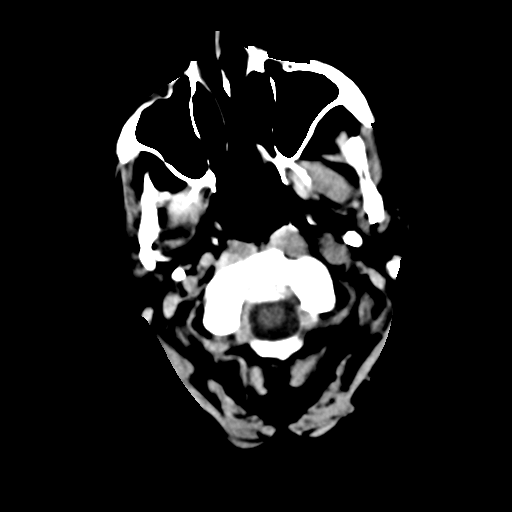
[im 3/36  bone]
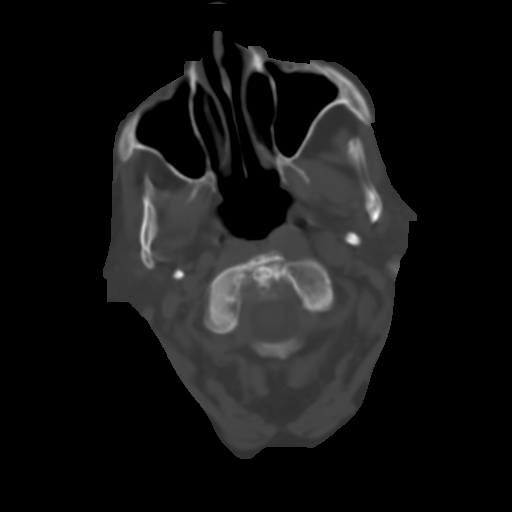
[im 7/36  brain]
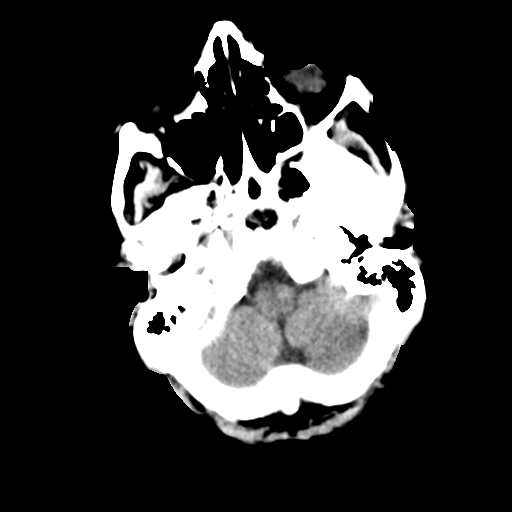
[im 10/36  brain]
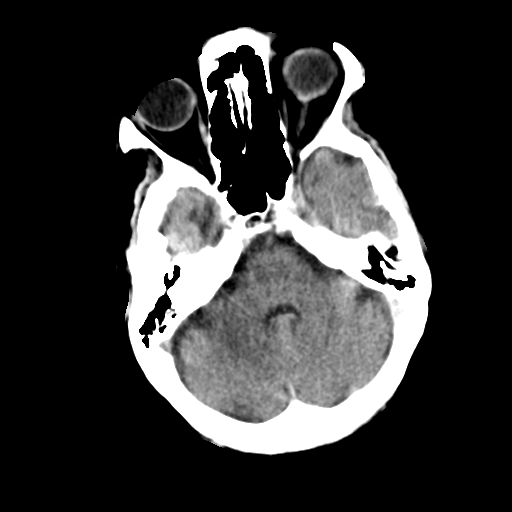
[im 14/36  brain]
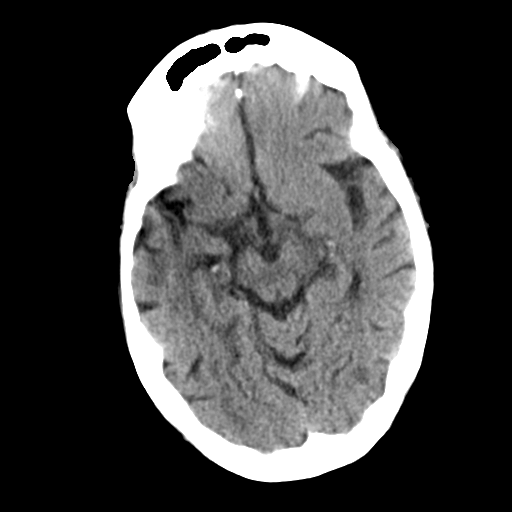
[im 19/36  brain]
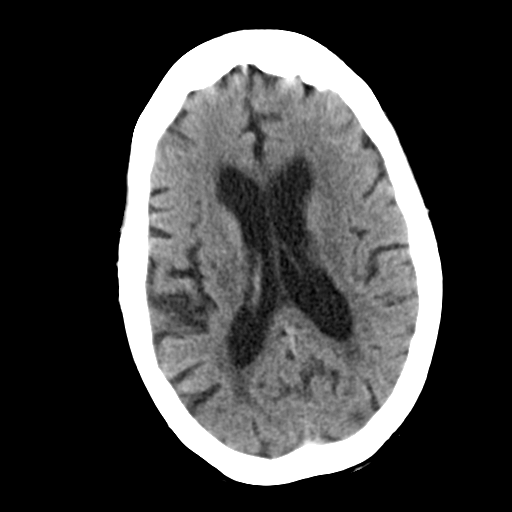
[im 19/36  bone]
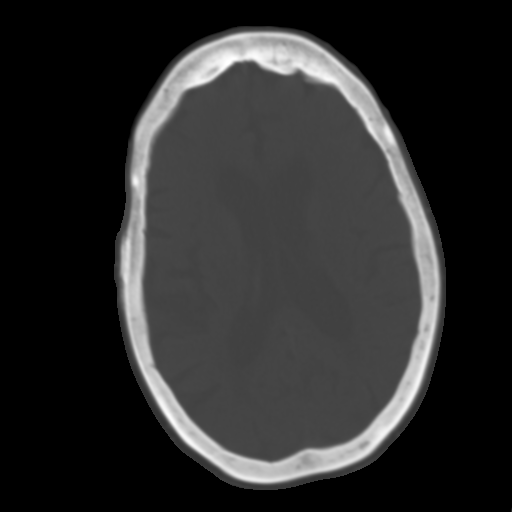
[im 22/36  brain]
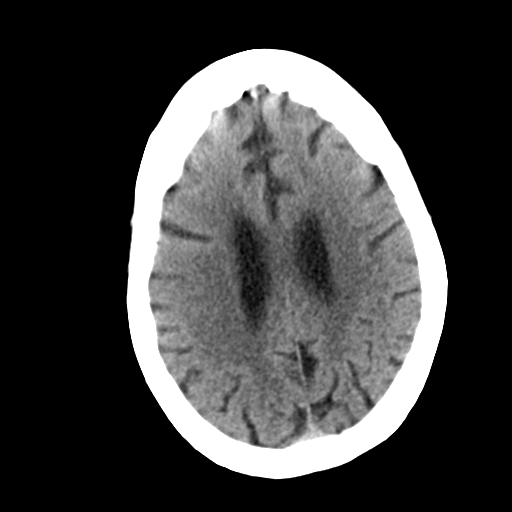
[im 26/36  brain]
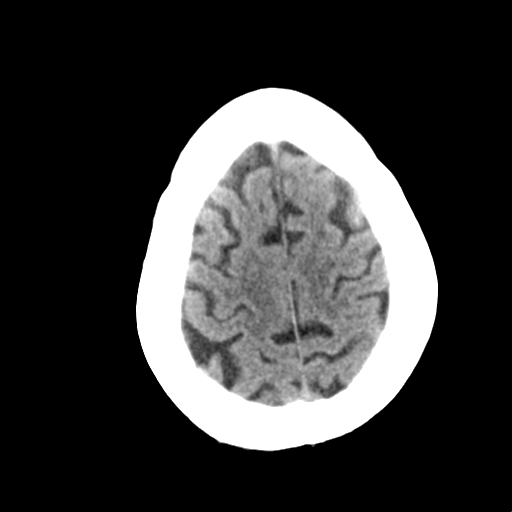
[im 29/36  brain]
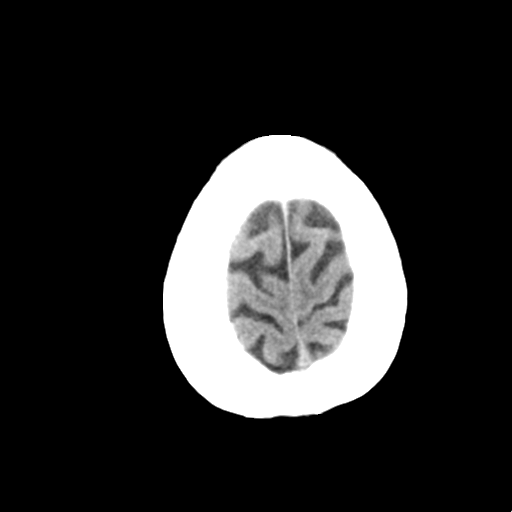
[im 33/36  brain]
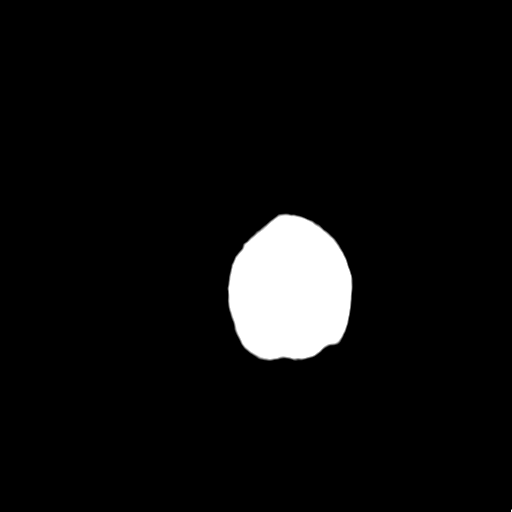
[im 33/36  bone]
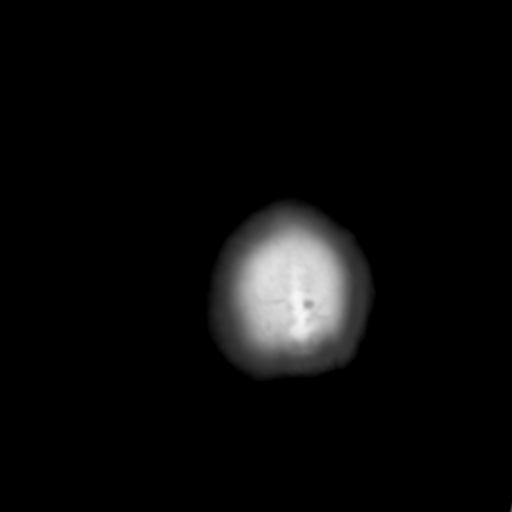

[Series 5: sagittal soft tissue · sagittal · 0.32mm/px · 3 of 54 slices shown]
[im 18/54  brain]
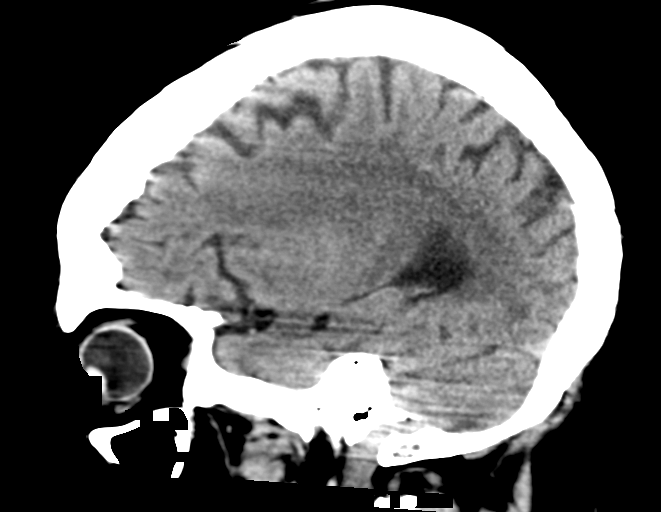
[im 27/54  brain]
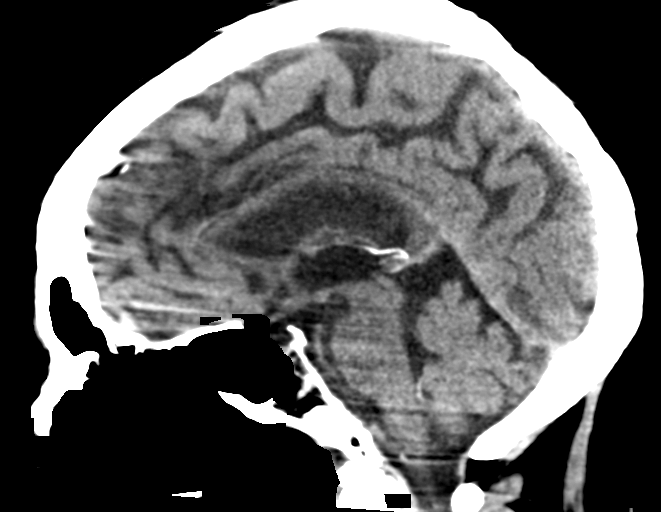
[im 36/54  brain]
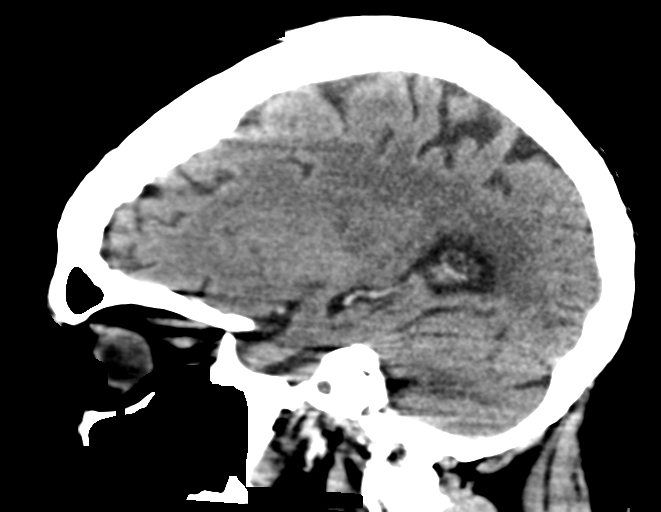

[Series 6: coronal soft tissue · coronal · 0.32mm/px · 3 of 70 slices shown]
[im 24/70  brain]
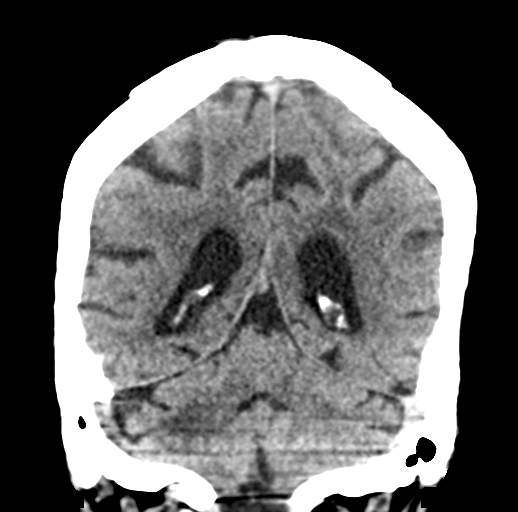
[im 31/70  brain]
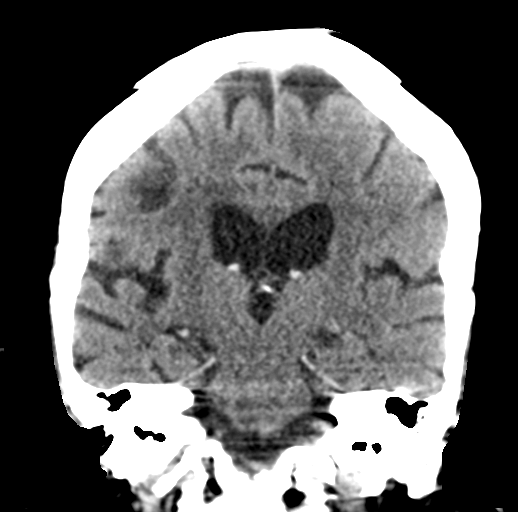
[im 39/70  brain]
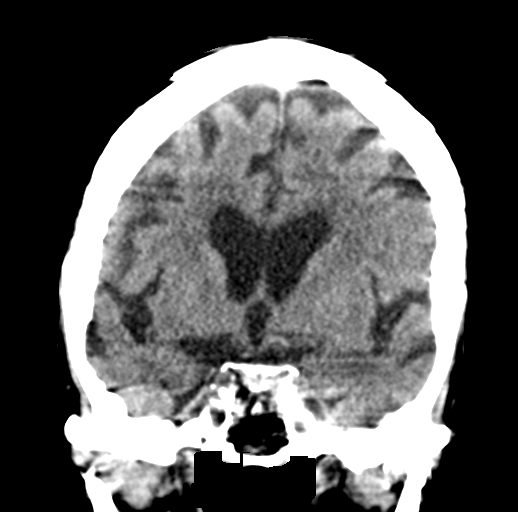

[15 of 47 positions shown; findings below may reference images not displayed]

FINDINGS: Motion limited exam.  Within this limitation:

Brain: Evolution of the previously seen infarct in the right MCA
territory with developing encephalomalacia in the right insula and
basal ganglia.No definite superimposed acute/interval large vascular
territory infarct. No acute hemorrhage. Similar generalized atrophy
with ex vacuo ventricular dilation. The no hydrocephalus. No mass
lesion or abnormal mass effect.

Vascular: No hyperdense vessel identified. Calcific intracranial
atherosclerosis.

Skull: No acute fracture

Sinuses/Orbits: Clear visualized sinuses. No acute orbital findings.

Other: No mastoid effusions.

ASPECTS ([place] Stroke Program Early CT Score) Total score (0-10
with 10 being normal): 10. Areas of right MCA hypoattenuation appear
to correlate with prior infarcts seen on [DATE] MRI.
IMPRESSION: Evolution of the previously seen infarct in the right MCA territory
with developing encephalomalacia in the right insula and basal
ganglia. No definite superimposed acute/interval large vascular
territory infarct or acute hemorrhage on this motion limited exam.
An MRI could provide more sensitive evaluation for peri-infarct
ischemia if clinically indicated.

Code stroke imaging results were communicated on 03/22/2021 at [DATE]
to provider [REDACTED] via telephone, who verbally acknowledged these
results.

## 2022-03-19 ENCOUNTER — Ambulatory Visit: Payer: Medicare Other | Admitting: Physician Assistant

## 2022-04-08 ENCOUNTER — Ambulatory Visit: Payer: Medicare Other | Admitting: Physician Assistant

## 2022-04-24 ENCOUNTER — Ambulatory Visit: Payer: Medicare Other | Admitting: Physician Assistant

## 2022-05-26 ENCOUNTER — Ambulatory Visit: Payer: Medicare Other | Admitting: Physician Assistant

## 2022-06-18 ENCOUNTER — Ambulatory Visit: Payer: Medicare Other | Admitting: Physician Assistant

## 2022-06-26 ENCOUNTER — Other Ambulatory Visit: Payer: Self-pay

## 2022-06-26 ENCOUNTER — Emergency Department
Admission: EM | Admit: 2022-06-26 | Discharge: 2022-06-26 | Disposition: A | Discharge status: Home / Self Care | Payer: Medicare Other | Attending: Emergency Medicine | Admitting: Emergency Medicine

## 2022-06-26 ENCOUNTER — Emergency Department: Payer: Medicare Other

## 2022-06-26 DIAGNOSIS — T17820A Food in other parts of respiratory tract causing asphyxiation, initial encounter: Secondary | ICD-10-CM | POA: Diagnosis not present

## 2022-06-26 DIAGNOSIS — R059 Cough, unspecified: Secondary | ICD-10-CM

## 2022-06-26 DIAGNOSIS — I129 Hypertensive chronic kidney disease with stage 1 through stage 4 chronic kidney disease, or unspecified chronic kidney disease: Secondary | ICD-10-CM | POA: Insufficient documentation

## 2022-06-26 DIAGNOSIS — F0390 Unspecified dementia without behavioral disturbance: Secondary | ICD-10-CM | POA: Insufficient documentation

## 2022-06-26 DIAGNOSIS — E1122 Type 2 diabetes mellitus with diabetic chronic kidney disease: Secondary | ICD-10-CM | POA: Diagnosis not present

## 2022-06-26 DIAGNOSIS — X58XXXA Exposure to other specified factors, initial encounter: Secondary | ICD-10-CM | POA: Insufficient documentation

## 2022-06-26 DIAGNOSIS — N189 Chronic kidney disease, unspecified: Secondary | ICD-10-CM | POA: Diagnosis not present

## 2022-06-26 DIAGNOSIS — T17908A Unspecified foreign body in respiratory tract, part unspecified causing other injury, initial encounter: Secondary | ICD-10-CM

## 2022-06-26 LAB — CBC WITH DIFFERENTIAL/PLATELET
Abs Immature Granulocytes: 0.09 10*3/uL — ABNORMAL HIGH (ref 0.00–0.07)
Basophils Absolute: 0.1 10*3/uL (ref 0.0–0.1)
Basophils Relative: 1 %
Eosinophils Absolute: 0.2 10*3/uL (ref 0.0–0.5)
Eosinophils Relative: 2 %
HCT: 39.5 % (ref 36.0–46.0)
Hemoglobin: 12.9 g/dL (ref 12.0–15.0)
Immature Granulocytes: 1 %
Lymphocytes Relative: 22 %
Lymphs Abs: 1.6 10*3/uL (ref 0.7–4.0)
MCH: 31.6 pg (ref 26.0–34.0)
MCHC: 32.7 g/dL (ref 30.0–36.0)
MCV: 96.8 fL (ref 80.0–100.0)
Monocytes Absolute: 1.2 10*3/uL — ABNORMAL HIGH (ref 0.1–1.0)
Monocytes Relative: 17 %
Neutro Abs: 4.2 10*3/uL (ref 1.7–7.7)
Neutrophils Relative %: 57 %
Platelets: 182 10*3/uL (ref 150–400)
RBC: 4.08 MIL/uL (ref 3.87–5.11)
RDW: 12.8 % (ref 11.5–15.5)
WBC: 7.2 10*3/uL (ref 4.0–10.5)
nRBC: 0 % (ref 0.0–0.2)

## 2022-06-26 LAB — BASIC METABOLIC PANEL
Anion gap: 5 (ref 5–15)
BUN: 34 mg/dL — ABNORMAL HIGH (ref 8–23)
CO2: 27 mmol/L (ref 22–32)
Calcium: 8.6 mg/dL — ABNORMAL LOW (ref 8.9–10.3)
Chloride: 108 mmol/L (ref 98–111)
Creatinine, Ser: 1.23 mg/dL — ABNORMAL HIGH (ref 0.44–1.00)
GFR, Estimated: 41 mL/min — ABNORMAL LOW (ref >60)
Glucose, Bld: 150 mg/dL — ABNORMAL HIGH (ref 70–99)
Potassium: 4.5 mmol/L (ref 3.5–5.1)
Sodium: 140 mmol/L (ref 135–145)

## 2022-06-26 MED ORDER — AMOXICILLIN-POT CLAVULANATE 875-125 MG PO TABS: 1.0000 | ORAL_TABLET | Freq: Two times a day (BID) | ORAL | 0 refills | Status: AC

## 2022-06-26 NOTE — ED Provider Notes (Signed)
[facility] Provider Note    Event Date/Time   First MD Initiated Contact with Patient 06/26/22 [phone]     (approximate)   History   Chief Complaint: Aspiration   HPI  Michelle Vang is a 88 y.o. female with a history of hypertension, diabetes, dementia, CKD who is sent to the ED from [facility] due to some cough while eating breakfast, worrisome for aspiration.  She did not have any hypoxia, and patient currently denies any acute symptoms.     Physical Exam   Triage Vital Signs: ED Triage Vitals  Enc Vitals Group     BP 06/26/22 0951 (!) 107/95     Pulse Rate 06/26/22 0951 66     Resp 06/26/22 0951 19     Temp 06/26/22 0951 98.3 F (36.8 C)     Temp Source 06/26/22 0951 Oral     SpO2 06/26/22 0943 99 %     Weight 06/26/22 0954 209 lb 1.6 oz (94.8 kg)     Height 06/26/22 0954 5\' 3"  (1.6 m)     Head Circumference --      Peak Flow --      Pain Score --      Pain Loc --      Pain Edu? --      Excl. in [place]? --     Most recent vital signs: Vitals:   06/26/22 0951 06/26/22 1100  BP: (!) 107/95 110/62  Pulse: 66 (!) 59  Resp: 19 (!) 21  Temp: 98.3 F (36.8 C)   SpO2: 98% 96%    General: Awake, no distress.  CV:  Good peripheral perfusion.  Irregular rhythm, rate of 60  resp:  Normal effort.  Auscultation bilaterally.  No wheezes or rhonchi. Abd:  No distention.  Soft nontender Other:  Moist mucous membranes.  No debris in the oropharynx.  Managing secretions.   ED Results / Procedures / Treatments   Labs (all labs ordered are listed, but only abnormal results are displayed) Labs Reviewed  BASIC METABOLIC PANEL - Abnormal; Notable for the following components:      Result Value   Glucose, Bld 150 (*)    BUN 34 (*)    Creatinine, Ser 1.23 (*)    Calcium 8.6 (*)    GFR, Estimated 41 (*)    All other components within normal limits  CBC WITH DIFFERENTIAL/PLATELET - Abnormal; Notable for the following components:   Monocytes  Absolute 1.2 (*)    Abs Immature Granulocytes 0.09 (*)    All other components within normal limits  URINALYSIS, ROUTINE W REFLEX MICROSCOPIC     EKG Interpreted by me Atrial fibrillation, rate of 68.  Normal axis, normal intervals.  Poor R wave progression.  Normal ST segments and T waves.  No ischemic changes.   RADIOLOGY Chest x-ray interpreted by me, no focal consolidation.  Radiology report reviewed, noting subtle upper airway changes possible aspiration   PROCEDURES:  Procedures   MEDICATIONS ORDERED IN ED: Medications - No data to display   IMPRESSION / MDM / [place] / ED COURSE  I reviewed the triage vital signs and the nursing notes.                              Differential diagnosis includes, but is not limited to, pneumonia, dysphagia, GERD, aspiration, electrolyte abnormality  Patient's presentation is most consistent with acute presentation with  potential threat to life or bodily function.  Patient sent to the ED due to coughing.  Vital signs are normal, appears to be at baseline state of health and mental status.  Exam is reassuring and nonfocal.  Serum labs unremarkable.  Chest x-ray shows no focal pneumonia but some signs of possible aspiration.  She does not require admission and this can be deferred to outpatient work-up for SLP/swallow eval.  We will treat with Augmentin for now.       FINAL CLINICAL IMPRESSION(S) / ED DIAGNOSES   Final diagnoses:  Cough, unspecified type  Aspiration into respiratory tract, initial encounter     Rx / DC Orders   ED Discharge Orders          Ordered    amoxicillin-clavulanate (AUGMENTIN) 875-125 MG tablet  2 times daily        06/26/22 1228             Note:  This document was prepared using Dragon voice recognition software and may include unintentional dictation errors.   Carrie Mew, MD 06/26/22 1243

## 2022-06-26 NOTE — ED Triage Notes (Signed)
Pt arrives from [facility]. They said she was eating breakfast and began coughing. They assumed she aspirated. RA saturations were 93 at EMS arrival. They placed her on 2L [place]  At ED arrival pt saturating 99% on room air, in no acute distress, normal color, regular breathing pattern

## 2022-06-26 NOTE — Discharge Instructions (Signed)
Your chest x-ray shows some signs of aspiration in the upper airway, but vital signs and oxygen levels and labs are all normal.  There is no clear pneumonia, but antibiotics may help.  You should follow-up with gastroenterology for further assessment for possible chronic aspiration which may increase risk of recurrent choking and pneumonia.

## 2022-07-22 ENCOUNTER — Telehealth: Payer: Self-pay

## 2022-07-22 NOTE — Telephone Encounter (Signed)
I called and spoke with the patient's daughter [name]. She informed me that the patient is currently staying at [facility] and all of her care is taken care of at the facility.

## 2022-09-14 ENCOUNTER — Other Ambulatory Visit: Payer: Self-pay

## 2022-09-14 ENCOUNTER — Emergency Department: Payer: Medicare Other

## 2022-09-14 ENCOUNTER — Inpatient Hospital Stay
Admission: EM | Admit: 2022-09-14 | Discharge: 2022-09-21 | DRG: 871 | Disposition: A | Discharge status: Hospice | Payer: Medicare Other | Source: Skilled Nursing Facility | Attending: Internal Medicine | Admitting: Internal Medicine

## 2022-09-14 ENCOUNTER — Encounter (HOSPITAL_COMMUNITY): Payer: Self-pay

## 2022-09-14 DIAGNOSIS — J189 Pneumonia, unspecified organism: Secondary | ICD-10-CM | POA: Diagnosis present

## 2022-09-14 DIAGNOSIS — Z902 Acquired absence of lung [part of]: Secondary | ICD-10-CM

## 2022-09-14 DIAGNOSIS — N179 Acute kidney failure, unspecified: Secondary | ICD-10-CM | POA: Diagnosis present

## 2022-09-14 DIAGNOSIS — Z1152 Encounter for screening for COVID-19: Secondary | ICD-10-CM

## 2022-09-14 DIAGNOSIS — E1122 Type 2 diabetes mellitus with diabetic chronic kidney disease: Secondary | ICD-10-CM | POA: Diagnosis present

## 2022-09-14 DIAGNOSIS — K219 Gastro-esophageal reflux disease without esophagitis: Secondary | ICD-10-CM | POA: Diagnosis present

## 2022-09-14 DIAGNOSIS — E119 Type 2 diabetes mellitus without complications: Secondary | ICD-10-CM | POA: Diagnosis not present

## 2022-09-14 DIAGNOSIS — E669 Obesity, unspecified: Secondary | ICD-10-CM | POA: Diagnosis present

## 2022-09-14 DIAGNOSIS — G9341 Metabolic encephalopathy: Secondary | ICD-10-CM | POA: Diagnosis present

## 2022-09-14 DIAGNOSIS — Z8551 Personal history of malignant neoplasm of bladder: Secondary | ICD-10-CM | POA: Diagnosis not present

## 2022-09-14 DIAGNOSIS — E039 Hypothyroidism, unspecified: Secondary | ICD-10-CM | POA: Diagnosis present

## 2022-09-14 DIAGNOSIS — Z85820 Personal history of malignant melanoma of skin: Secondary | ICD-10-CM

## 2022-09-14 DIAGNOSIS — Z800 Family history of malignant neoplasm of digestive organs: Secondary | ICD-10-CM

## 2022-09-14 DIAGNOSIS — Z823 Family history of stroke: Secondary | ICD-10-CM

## 2022-09-14 DIAGNOSIS — R6520 Severe sepsis without septic shock: Secondary | ICD-10-CM | POA: Diagnosis present

## 2022-09-14 DIAGNOSIS — J90 Pleural effusion, not elsewhere classified: Secondary | ICD-10-CM | POA: Diagnosis present

## 2022-09-14 DIAGNOSIS — Z79890 Hormone replacement therapy (postmenopausal): Secondary | ICD-10-CM

## 2022-09-14 DIAGNOSIS — I129 Hypertensive chronic kidney disease with stage 1 through stage 4 chronic kidney disease, or unspecified chronic kidney disease: Secondary | ICD-10-CM | POA: Diagnosis present

## 2022-09-14 DIAGNOSIS — Z66 Do not resuscitate: Secondary | ICD-10-CM | POA: Diagnosis present

## 2022-09-14 DIAGNOSIS — E870 Hyperosmolality and hypernatremia: Secondary | ICD-10-CM | POA: Diagnosis present

## 2022-09-14 DIAGNOSIS — A419 Sepsis, unspecified organism: Principal | ICD-10-CM | POA: Diagnosis present

## 2022-09-14 DIAGNOSIS — G9340 Encephalopathy, unspecified: Secondary | ICD-10-CM | POA: Diagnosis not present

## 2022-09-14 DIAGNOSIS — N1832 Chronic kidney disease, stage 3b: Secondary | ICD-10-CM | POA: Diagnosis present

## 2022-09-14 DIAGNOSIS — Z8673 Personal history of transient ischemic attack (TIA), and cerebral infarction without residual deficits: Secondary | ICD-10-CM

## 2022-09-14 DIAGNOSIS — E1165 Type 2 diabetes mellitus with hyperglycemia: Secondary | ICD-10-CM | POA: Diagnosis present

## 2022-09-14 DIAGNOSIS — F0390 Unspecified dementia without behavioral disturbance: Secondary | ICD-10-CM | POA: Diagnosis present

## 2022-09-14 DIAGNOSIS — J9621 Acute and chronic respiratory failure with hypoxia: Secondary | ICD-10-CM | POA: Diagnosis present

## 2022-09-14 DIAGNOSIS — N1831 Chronic kidney disease, stage 3a: Secondary | ICD-10-CM | POA: Diagnosis not present

## 2022-09-14 DIAGNOSIS — R4701 Aphasia: Secondary | ICD-10-CM | POA: Diagnosis present

## 2022-09-14 DIAGNOSIS — R1310 Dysphagia, unspecified: Secondary | ICD-10-CM | POA: Diagnosis present

## 2022-09-14 DIAGNOSIS — Z79899 Other long term (current) drug therapy: Secondary | ICD-10-CM

## 2022-09-14 DIAGNOSIS — J9601 Acute respiratory failure with hypoxia: Secondary | ICD-10-CM | POA: Diagnosis present

## 2022-09-14 DIAGNOSIS — Z85118 Personal history of other malignant neoplasm of bronchus and lung: Secondary | ICD-10-CM

## 2022-09-14 DIAGNOSIS — Z515 Encounter for palliative care: Secondary | ICD-10-CM

## 2022-09-14 DIAGNOSIS — Z8249 Family history of ischemic heart disease and other diseases of the circulatory system: Secondary | ICD-10-CM

## 2022-09-14 DIAGNOSIS — Z794 Long term (current) use of insulin: Secondary | ICD-10-CM

## 2022-09-14 DIAGNOSIS — J918 Pleural effusion in other conditions classified elsewhere: Secondary | ICD-10-CM | POA: Diagnosis not present

## 2022-09-14 DIAGNOSIS — I4819 Other persistent atrial fibrillation: Secondary | ICD-10-CM | POA: Diagnosis present

## 2022-09-14 DIAGNOSIS — Z888 Allergy status to other drugs, medicaments and biological substances status: Secondary | ICD-10-CM | POA: Diagnosis not present

## 2022-09-14 DIAGNOSIS — Z7901 Long term (current) use of anticoagulants: Secondary | ICD-10-CM | POA: Diagnosis not present

## 2022-09-14 DIAGNOSIS — N183 Chronic kidney disease, stage 3 unspecified: Secondary | ICD-10-CM | POA: Diagnosis present

## 2022-09-14 DIAGNOSIS — Z7189 Other specified counseling: Secondary | ICD-10-CM | POA: Diagnosis not present

## 2022-09-14 DIAGNOSIS — Z9981 Dependence on supplemental oxygen: Secondary | ICD-10-CM

## 2022-09-14 DIAGNOSIS — Z6834 Body mass index (BMI) 34.0-34.9, adult: Secondary | ICD-10-CM

## 2022-09-14 LAB — PROTIME-INR
INR: 2.5 — ABNORMAL HIGH (ref 0.8–1.2)
Prothrombin Time: 26.9 seconds — ABNORMAL HIGH (ref 11.4–15.2)

## 2022-09-14 LAB — CBC WITH DIFFERENTIAL/PLATELET
Abs Immature Granulocytes: 0.55 10*3/uL — ABNORMAL HIGH (ref 0.00–0.07)
Basophils Absolute: 0.1 10*3/uL (ref 0.0–0.1)
Basophils Relative: 1 %
Eosinophils Absolute: 0 10*3/uL (ref 0.0–0.5)
Eosinophils Relative: 0 %
HCT: 34.2 % — ABNORMAL LOW (ref 36.0–46.0)
Hemoglobin: 10.8 g/dL — ABNORMAL LOW (ref 12.0–15.0)
Immature Granulocytes: 2 %
Lymphocytes Relative: 7 %
Lymphs Abs: 1.6 10*3/uL (ref 0.7–4.0)
MCH: 30.5 pg (ref 26.0–34.0)
MCHC: 31.6 g/dL (ref 30.0–36.0)
MCV: 96.6 fL (ref 80.0–100.0)
Monocytes Absolute: 3.2 10*3/uL — ABNORMAL HIGH (ref 0.1–1.0)
Monocytes Relative: 14 %
Neutro Abs: 18 10*3/uL — ABNORMAL HIGH (ref 1.7–7.7)
Neutrophils Relative %: 76 %
Platelets: 276 10*3/uL (ref 150–400)
RBC: 3.54 MIL/uL — ABNORMAL LOW (ref 3.87–5.11)
RDW: 13.2 % (ref 11.5–15.5)
Smear Review: NORMAL
WBC: 23.5 10*3/uL — ABNORMAL HIGH (ref 4.0–10.5)
nRBC: 0 % (ref 0.0–0.2)

## 2022-09-14 LAB — COMPREHENSIVE METABOLIC PANEL
ALT: 45 U/L — ABNORMAL HIGH (ref 0–44)
AST: 60 U/L — ABNORMAL HIGH (ref 15–41)
Albumin: 1.8 g/dL — ABNORMAL LOW (ref 3.5–5.0)
Alkaline Phosphatase: 144 U/L — ABNORMAL HIGH (ref 38–126)
Anion gap: 6 (ref 5–15)
BUN: 46 mg/dL — ABNORMAL HIGH (ref 8–23)
CO2: 25 mmol/L (ref 22–32)
Calcium: 7.8 mg/dL — ABNORMAL LOW (ref 8.9–10.3)
Chloride: 117 mmol/L — ABNORMAL HIGH (ref 98–111)
Creatinine, Ser: 1.26 mg/dL — ABNORMAL HIGH (ref 0.44–1.00)
GFR, Estimated: 40 mL/min — ABNORMAL LOW (ref >60)
Glucose, Bld: 329 mg/dL — ABNORMAL HIGH (ref 70–99)
Potassium: 4 mmol/L (ref 3.5–5.1)
Sodium: 148 mmol/L — ABNORMAL HIGH (ref 135–145)
Total Bilirubin: 0.7 mg/dL (ref 0.3–1.2)
Total Protein: 5.9 g/dL — ABNORMAL LOW (ref 6.5–8.1)

## 2022-09-14 LAB — LACTIC ACID, PLASMA
Lactic Acid, Venous: 3 mmol/L (ref 0.5–1.9)
Lactic Acid, Venous: 2.8 mmol/L (ref 0.5–1.9)

## 2022-09-14 LAB — RESP PANEL BY RT-PCR (RSV, FLU A&B, COVID)  RVPGX2
Influenza A by PCR: NEGATIVE
Influenza B by PCR: NEGATIVE
Resp Syncytial Virus by PCR: NEGATIVE
SARS Coronavirus 2 by RT PCR: NEGATIVE

## 2022-09-14 LAB — CBG MONITORING, ED: Glucose-Capillary: 241 mg/dL — ABNORMAL HIGH (ref 70–99)

## 2022-09-14 MED ORDER — LACTATED RINGERS IV BOLUS (SEPSIS)
1000.0000 mL | Freq: Once | INTRAVENOUS | Status: AC
  Administered 2022-09-14: 1000 mL via INTRAVENOUS

## 2022-09-14 MED ORDER — ENOXAPARIN SODIUM 40 MG/0.4ML IJ SOSY
40.0000 mg | PREFILLED_SYRINGE | INTRAMUSCULAR | Status: DC
  Administered 2022-09-14: 40 mg via SUBCUTANEOUS
  Filled 2022-09-14: qty 0.4

## 2022-09-14 MED ORDER — LEVOTHYROXINE SODIUM 50 MCG PO TABS
75.0000 ug | ORAL_TABLET | Freq: Every day | ORAL | Status: DC
  Administered 2022-09-15 – 2022-09-19 (×3): 75 ug via ORAL
  Filled 2022-09-14 (×2): qty 1
  Filled 2022-09-14: qty 2

## 2022-09-14 MED ORDER — ACETAMINOPHEN 325 MG PO TABS
650.0000 mg | ORAL_TABLET | Freq: Four times a day (QID) | ORAL | Status: DC | PRN
  Administered 2022-09-17 – 2022-09-18 (×2): 650 mg via ORAL
  Filled 2022-09-14 (×2): qty 2

## 2022-09-14 MED ORDER — INSULIN GLARGINE-YFGN 100 UNIT/ML ~~LOC~~ SOLN
6.0000 [IU] | Freq: Every day | SUBCUTANEOUS | Status: DC
  Administered 2022-09-14 – 2022-09-18 (×5): 6 [IU] via SUBCUTANEOUS
  Filled 2022-09-14 (×6): qty 0.06

## 2022-09-14 MED ORDER — VANCOMYCIN HCL IN DEXTROSE 1-5 GM/200ML-% IV SOLN
1000.0000 mg | Freq: Once | INTRAVENOUS | Status: AC
  Administered 2022-09-14: 1000 mg via INTRAVENOUS
  Filled 2022-09-14: qty 200

## 2022-09-14 MED ORDER — SERTRALINE HCL 50 MG PO TABS
25.0000 mg | ORAL_TABLET | Freq: Every day | ORAL | Status: DC
  Administered 2022-09-15 – 2022-09-19 (×4): 25 mg via ORAL
  Filled 2022-09-14 (×4): qty 1

## 2022-09-14 MED ORDER — INSULIN ASPART 100 UNIT/ML IJ SOLN
0.0000 [IU] | Freq: Three times a day (TID) | INTRAMUSCULAR | Status: DC
  Administered 2022-09-15 (×3): 5 [IU] via SUBCUTANEOUS
  Administered 2022-09-16 (×2): 3 [IU] via SUBCUTANEOUS
  Administered 2022-09-17: 15 [IU] via SUBCUTANEOUS
  Administered 2022-09-17: 11 [IU] via SUBCUTANEOUS
  Administered 2022-09-17: 15 [IU] via SUBCUTANEOUS
  Administered 2022-09-18: 5 [IU] via SUBCUTANEOUS
  Administered 2022-09-18 (×2): 8 [IU] via SUBCUTANEOUS
  Administered 2022-09-19: 5 [IU] via SUBCUTANEOUS
  Administered 2022-09-19 (×2): 3 [IU] via SUBCUTANEOUS
  Filled 2022-09-14 (×14): qty 1

## 2022-09-14 MED ORDER — DIVALPROEX SODIUM ER 500 MG PO TB24
500.0000 mg | ORAL_TABLET | Freq: Every day | ORAL | Status: DC
  Administered 2022-09-14 – 2022-09-18 (×3): 500 mg via ORAL
  Filled 2022-09-14 (×3): qty 1
  Filled 2022-09-14: qty 2

## 2022-09-14 MED ORDER — APIXABAN 5 MG PO TABS
5.0000 mg | ORAL_TABLET | Freq: Two times a day (BID) | ORAL | Status: DC
  Administered 2022-09-15 – 2022-09-19 (×6): 5 mg via ORAL
  Filled 2022-09-14 (×6): qty 1

## 2022-09-14 MED ORDER — SODIUM CHLORIDE 0.9 % IV SOLN
2.0000 g | INTRAVENOUS | Status: AC
  Administered 2022-09-14 – 2022-09-18 (×5): 2 g via INTRAVENOUS
  Filled 2022-09-14 (×5): qty 20

## 2022-09-14 MED ORDER — METRONIDAZOLE 500 MG/100ML IV SOLN
500.0000 mg | Freq: Once | INTRAVENOUS | Status: AC
  Administered 2022-09-14: 500 mg via INTRAVENOUS
  Filled 2022-09-14: qty 100

## 2022-09-14 MED ORDER — VANCOMYCIN HCL 1250 MG/250ML IV SOLN: 1250.0000 mg | INTRAVENOUS | Status: DC

## 2022-09-14 MED ORDER — LACTATED RINGERS IV SOLN: INTRAVENOUS | Status: AC

## 2022-09-14 MED ORDER — VANCOMYCIN HCL IN DEXTROSE 1-5 GM/200ML-% IV SOLN
1000.0000 mg | Freq: Once | INTRAVENOUS | Status: AC
  Administered 2022-09-14: 1000 mg via INTRAVENOUS

## 2022-09-14 MED ORDER — METRONIDAZOLE 500 MG/100ML IV SOLN
500.0000 mg | Freq: Two times a day (BID) | INTRAVENOUS | Status: DC
  Administered 2022-09-14 – 2022-09-19 (×10): 500 mg via INTRAVENOUS
  Filled 2022-09-14 (×10): qty 100

## 2022-09-14 MED ORDER — SODIUM CHLORIDE 0.9 % IV SOLN
2.0000 g | Freq: Once | INTRAVENOUS | Status: AC
  Administered 2022-09-14: 2 g via INTRAVENOUS
  Filled 2022-09-14: qty 12.5

## 2022-09-14 MED ORDER — METOPROLOL SUCCINATE ER 25 MG PO TB24
12.5000 mg | ORAL_TABLET | Freq: Every day | ORAL | Status: DC
  Administered 2022-09-15 – 2022-09-18 (×3): 12.5 mg via ORAL
  Filled 2022-09-14 (×2): qty 1

## 2022-09-14 MED ORDER — VANCOMYCIN HCL IN DEXTROSE 1-5 GM/200ML-% IV SOLN
1000.0000 mg | Freq: Once | INTRAVENOUS | Status: DC
  Filled 2022-09-14: qty 200

## 2022-09-14 MED ORDER — IOHEXOL 300 MG/ML  SOLN
100.0000 mL | Freq: Once | INTRAMUSCULAR | Status: AC | PRN
  Administered 2022-09-14: 80 mL via INTRAVENOUS

## 2022-09-14 MED ORDER — MEMANTINE HCL 5 MG PO TABS
5.0000 mg | ORAL_TABLET | Freq: Two times a day (BID) | ORAL | Status: DC
  Administered 2022-09-14 – 2022-09-19 (×6): 5 mg via ORAL
  Filled 2022-09-14 (×6): qty 1

## 2022-09-14 MED ORDER — IPRATROPIUM-ALBUTEROL 0.5-2.5 (3) MG/3ML IN SOLN
3.0000 mL | Freq: Four times a day (QID) | RESPIRATORY_TRACT | Status: DC | PRN
  Administered 2022-09-15 – 2022-09-17 (×5): 3 mL via RESPIRATORY_TRACT
  Filled 2022-09-14 (×4): qty 3

## 2022-09-14 NOTE — Assessment & Plan Note (Signed)
Patient in atrial fibrillation, with occasional rapid ventricular rate.  - Continue home metoprolol and Eliquis

## 2022-09-14 NOTE — ED Triage Notes (Signed)
Pt arrives via EMS from [facility] and a sepsis alert was called by EMS- pt's pressures have been low, with the last one 90s/30s- pt pt received 457ml of NS IV- pt has been sick the last 4 days with URI

## 2022-09-14 NOTE — Assessment & Plan Note (Signed)
Patient has a history of underlying dementia, however is currently nonverbal and not following commands but will look to verbal stimuli.  Per patient's daughter, this is out of the range of her normal behavior.  Likely in the setting of acute infection with possible hypoactive delirium.

## 2022-09-14 NOTE — Consult Note (Signed)
Pharmacy Antibiotic Note  Michelle Vang is a 88 y.o. female admitted on 09/14/2022 with pneumonia.  Pharmacy has been consulted for vancomycin dosing.  Assessment: 88 y.o. female brought to ED from [facility] via EMS.  Was diagnosed with URI ~4 days ago and was started on doxycycline. Today, she became increasingly lethargic and EMS was called.  WBC 23.5, Lactic Acid 3.0, BG 329, BUN 46, afebrile     Plan: Received vancomycin 2000 mg IV LD Vancomycin 1250 mg IV Q 36 hrs. Goal AUC 400-550. Expected AUC: 502.7, Cmin 12.9, Cmax 31.9 SCr used: 1.26   Height: 5\' 4"  (162.6 cm) Weight: 91.2 kg (201 lb) IBW/kg (Calculated) : 54.7  Temp (24hrs), Avg:97.6 F (36.4 C), Min:97.5 F (36.4 C), Max:97.7 F (36.5 C)  Recent Labs  Lab 09/14/22 1404 09/14/22 1543  WBC 23.5*  --   CREATININE 1.26*  --   LATICACIDVEN 3.0* 2.8*    Estimated Creatinine Clearance: 31.8 mL/min (A) (by C-G formula based on SCr of 1.26 mg/dL (H)).    Allergies  Allergen Reactions   Aricept [Donepezil Hcl] Diarrhea    Antimicrobials this admission: Vancomycin 12/31 >>  Ceftriaxone 12/31 >>  Metronidazole 12/31 >> Cefepime x 1 dose 12/31  Dose adjustments this admission: N/A  Microbiology results: 12/31 BCx: pending 12/31 MRSA PCR: ordered  Thank you for allowing pharmacy to be a part of this patient's care.  Alison Murray 09/14/2022 8:14 PM

## 2022-09-14 NOTE — Assessment & Plan Note (Signed)
-   SSI, moderate - Semglee 6 units nightly

## 2022-09-14 NOTE — Consult Note (Signed)
PHARMACY -  BRIEF ANTIBIOTIC NOTE   Pharmacy has received consult(s) for cefepime and vancomycin from an ED provider.  The patient's profile has been reviewed for ht/wt/allergies/indication/available labs.    One time order(s) placed for vancomycin 2000mg  IV x1 and cefepime 2g IV x1  Further antibiotics/pharmacy consults should be ordered by admitting physician if indicated.                       Thank you, Darrick Penna 09/14/2022  3:52 PM

## 2022-09-14 NOTE — H&P (Signed)
History and Physical    Patient: Michelle Vang VEL:[id number] DOB: [birth year] DOA: 09/14/2022 DOS: the patient was seen and examined on 09/14/2022 PCP: No primary care provider on file.  Patient coming from: ALF/ILF  Chief Complaint:  Chief Complaint  Patient presents with   Code Sepsis   HPI: Michelle Vang is a 88 y.o. female with medical history significant of dementia, hypothyroidism, hypertension, persistent atrial fibrillation, stage IIIa CKD, CVA, type 2 diabetes, who presents to the ED with complaints of lethargy.  History obtained through chart review as patient is currently nonverbal and no family at bedside.  Per chart review, patient currently resides at [facility].  She was diagnosed with an upper respiratory infection approximately 4 days ago and was started on doxycycline.  Today, she became increasingly lethargic although remained responsive to verbal stimuli.  Due to this, EMS was called.  ED course: On arrival to the ED, patient was afebrile 97.7 with blood pressure 115/52 and heart rate of 98.  She was saturating at 94% on 3 L of supplemental oxygen.  Initial workup remarkable for WBC of 23.5, lactic acid of 3.0, sodium of 148, chloride of 117, glucose of 329, BUN of 46, creatinine of 1.26, alkaline phosphatase of 144, AST of 60, ALT of 45, with GFR 40.   CT chest was obtained that demonstrated with near complete consolidation of the residual right lung with possible pneumonia and areas concerning for necrosis.  Large loculated right pleural effusion without evidence of internal gas bubbles.  TRH contacted for admission.  Review of Systems: unable to review all systems due to the inability of the patient to answer questions.  Past Medical History:  Diagnosis Date   Bladder cancer ([place])    Diabetes mellitus without complication ([place])    GERD (gastroesophageal reflux disease)    Hypertension    Lung cancer ([place])    Thyroid disease    Past Surgical History:   Procedure Laterality Date   APPLICATION OF WOUND VAC Left 03/24/2021   Procedure: APPLICATION OF PREVENA WOUND VAC;  Surgeon: Leim Fabry, MD;  Location: [facility] ORS;  Service: Orthopedics;  Laterality: Left;   CYSTOSCOPY     I & D EXTREMITY Left 03/24/2021   Procedure: IRRIGATION AND DEBRIDEMENT EXTREMITY;  Surgeon: Leim Fabry, MD;  Location: [facility] ORS;  Service: Orthopedics;  Laterality: Left;   LUNG CANCER SURGERY     Removed 1 lobe on Right lung   SKIN CANCER EXCISION  2014   Melanoma   Social History:  reports that she has never smoked. She has never used smokeless tobacco. She reports that she does not drink alcohol and does not use drugs.  Allergies  Allergen Reactions   Aricept [Donepezil Hcl] Diarrhea    Family History  Problem Relation Age of Onset   Stroke Mother    Heart disease Father    Cancer Sister        Pancreatic    Prior to Admission medications   Medication Sig Start Date End Date Taking? Authorizing Provider  acetaminophen (TYLENOL) 325 MG tablet Take 650 mg by mouth every 6 (six) hours as needed for mild pain or moderate pain.   Yes [provider]  apixaban (ELIQUIS) 2.5 MG TABS tablet Take 2 tablets (5 mg total) by mouth 2 (two) times daily. 02/14/21  Yes Annita Brod, MD  atorvastatin (LIPITOR) 10 MG tablet TAKE 1 TABLET BY MOUTH ONCE A DAY 07/31/21  Yes Jearld Fenton, NP  Blood  Glucose Monitoring Suppl (TRUE FOCUS BLOOD GLUCOSE METER) DEVI 1 Device by Does not apply route daily. 02/21/21  Yes Jearld Fenton, NP  Cholecalciferol (VITAMIN D3) 1000 UNITS CAPS Take 1,000 Units by mouth daily.   Yes [provider]  dextromethorphan-guaiFENesin (MUCINEX DM) 30-600 MG 12hr tablet Take 1 tablet by mouth 2 (two) times daily as needed for cough.   Yes [provider]  divalproex (DEPAKOTE ER) 500 MG 24 hr tablet Take 500 mg by mouth at bedtime.   Yes [provider]  doxycycline (VIBRAMYCIN) 100 MG capsule Take 100 mg by  mouth 2 (two) times daily. 09/13/22 09/20/22 Yes [provider]  glucose blood (TRUE FOCUS BLOOD GLUCOSE STRIP) test strip Use as instructed 02/21/21  Yes Baity, Coralie Keens, NP  insulin glargine (LANTUS) 100 UNIT/ML Solostar Pen Inject 10 Units into the skin daily. Patient taking differently: Inject 12 Units into the skin daily. 07/01/21  Yes Baity, Coralie Keens, NP  ipratropium-albuterol (DUONEB) 0.5-2.5 (3) MG/3ML SOLN Take 3 mLs by nebulization every 6 (six) hours as needed (wheezing/shortness of breath).   Yes [provider]  Lancets Glory Rosebush ULTRASOFT) lancets Use as instructed 02/21/21  Yes Baity, Coralie Keens, NP  levothyroxine (SYNTHROID) 75 MCG tablet Take 75 mcg by mouth daily. 06/04/22  Yes [provider]  memantine (NAMENDA) 5 MG tablet Take 5 mg by mouth 2 (two) times daily.   Yes [provider]  metoprolol succinate (TOPROL-XL) 25 MG 24 hr tablet TAKE 1 TABLET BY MOUTH ONCE A DAY Patient taking differently: Take 12.5 mg by mouth daily. 11/22/21  Yes Baity, Coralie Keens, NP  QUEtiapine (SEROQUEL) 50 MG tablet Take 50 mg by mouth at bedtime.   Yes [provider]  sertraline (ZOLOFT) 25 MG tablet Take 25 mg by mouth daily.   Yes [provider]  traZODone (DESYREL) 100 MG tablet Take 100 mg by mouth at bedtime.   Yes [provider]  traZODone (DESYREL) 50 MG tablet Take 25 mg by mouth daily as needed (agitation).   Yes [provider]    Physical Exam: Vitals:   09/14/22 1712 09/14/22 1744 09/14/22 2000 09/14/22 2006  BP:  137/84 (!) 125/56   Pulse: (!) 110 (!) 107 97   Resp: (!) 23 (!) 22 20   Temp:    (!) 97.5 F (36.4 C)  TempSrc:    Axillary  SpO2: 98% 98% 97%   Weight:      Height:       Physical Exam Vitals and nursing note reviewed.  Constitutional:      Appearance: She is obese. She is ill-appearing.  HENT:     Head: Normocephalic and atraumatic.     Mouth/Throat:     Mouth: Mucous membranes are dry.      Pharynx: Oropharynx is clear.  Eyes:     Conjunctiva/sclera: Conjunctivae normal.     Pupils: Pupils are equal, round, and reactive to light.  Cardiovascular:     Rate and Rhythm: Regular rhythm. Tachycardia present.  Pulmonary:     Effort: Tachypnea present.     Breath sounds: Decreased breath sounds (Markedly decreased breath sounds throughout right lung fields.) present. No wheezing, rhonchi or rales.  Abdominal:     General: Bowel sounds are normal. There is no distension.     Palpations: Abdomen is soft.     Tenderness: There is no abdominal tenderness. There is no guarding.  Musculoskeletal:     Right lower leg: No  edema.     Left lower leg: No edema.  Skin:    General: Skin is warm and dry.  Neurological:     Mental Status: She is alert.     Comments: Patient is alert and will look towards verbal stimuli but is not speaking or responding to questions.  Not following commands.    Data Reviewed: CBC with WBC of 23.5 with hemoglobin of 10.8 and platelets of 276.  Peripheral smear notable for burr cells, toxic granulation but normal platelets. CMP with sodium of 148, chloride of 117, glucose of 329, BUN of 46, creatinine 1.26, calcium of 7.6, alkaline phosphatase 144, albumin of 1.8, AST of 60, ALT 45, and GFR 45 Lactic acid elevated at 3.0 with improvement to 2.8 INR elevated at 2.5. COVID-19, influenza and RSV PCR negative  EKG personally reviewed.  Some motion degradation, however appears sinus rhythm with a rate of 109.  No ST or T wave changes concerning for acute ischemia.  CT Chest W Contrast  Result Date: 09/14/2022 CLINICAL DATA:  Right-sided effusion question empyema EXAM: CT CHEST WITH CONTRAST TECHNIQUE: Multidetector CT imaging of the chest was performed during intravenous contrast administration. RADIATION DOSE REDUCTION: This exam was performed according to the departmental dose-optimization program which includes automated exposure control, adjustment of the mA  and/or kV according to patient size and/or use of iterative reconstruction technique. CONTRAST:  80mL OMNIPAQUE IOHEXOL 300 MG/ML  SOLN COMPARISON:  Chest x-ray 09/14/2022, PET CT 07/16/2010 FINDINGS: Cardiovascular: Moderate aortic atherosclerosis. No aneurysm. No dissection. Postsurgical changes of the right pulmonary artery. Mild coronary vascular calcification. Borderline cardiomegaly. No pericardial effusion. Mediastinum/Nodes: Midline trachea. No suspicious thyroid mass. Subcentimeter mediastinal lymph nodes. Mild air distension of upper esophagus. Esophagus otherwise unremarkable. Lungs/Pleura: Volume loss right thorax with postsurgical changes compatible with previous right lobectomies. Suspected fluid and or debris within distal right bronchi. Near complete consolidation of the residual right lung with areas of central hypodensity, potentially due to areas of necrosis. Large multi loculated right pleural effusion. No internal gas collections. Some areas of increased internal density within the fluid at the right lung base, consistent with non simple fluid. Left lung demonstrates mild mosaic density potentially due to small airways disease. Clumped appearing consolidation at the left apical lung measuring 1 point 8 x 1.1 cm, series 3, image 36. Upper Abdomen: No acute finding. 2.4 cm hypodensity exophytic to upper pole right kidney not entirely consistent with simple cyst. Musculoskeletal: Sternum is intact. Age indeterminate mild superior endplate deformities at T2 and T4. IMPRESSION: 1. Volume loss right thorax with post lobectomy changes. Near complete consolidation of residual right lung parenchyma, possible pneumonia. Areas of central low density within the consolidation raising possibility of necrosis, underlying mass not excluded. Fluid and or debris within right bronchi. Large loculated right pleural effusion without internal gas bubbles or strong rim enhancement but some increased density within the  fluid at the right base suggesting non simple fluid. 2. Clumped appearing consolidation at the left apical lung measuring up to 1.1 cm, indeterminate for infection or pulmonary nodule. Short interval CT follow-up is advised. 3. 2.4 cm hypodensity exophytic to upper pole right kidney not entirely consistent with simple cyst. Recommend nonemergent renal ultrasound for further evaluation. 4. Age indeterminate mild superior endplate deformities at T2 and T4. 5. Aortic atherosclerosis. Aortic Atherosclerosis (ICD10-I70.0). Electronically Signed   By: Donavan Foil M.D.   On: 09/14/2022 16:55   DG Chest Port 1 View  Result Date: 09/14/2022 CLINICAL DATA:  Sepsis EXAM: PORTABLE CHEST 1 VIEW COMPARISON:  06/26/2022 FINDINGS: There is almost complete opacification of right hemithorax. There is small area of aeration in the medial right upper and right mid lung fields. Transverse diameter of heart is increased. No focal abnormalities are seen in left lung. Left lateral CP angle is clear. IMPRESSION: There is marked interval increase in opacification of right hemithorax. This may suggest interval appearance of atelectasis/pneumonia and large pleural effusion. Some of the effusion appears to be loculated in the lateral aspect of right upper and right mid lung fields. Possibility of empyema is not excluded. Follow-up CT chest may be considered. Electronically Signed   By: Elmer Picker M.D.   On: 09/14/2022 14:29    Results are pending, will review when available.  Assessment and Plan: * CAP (community acquired pneumonia) Patient presenting with several day history of upper respiratory symptoms on doxycycline found to have near complete consolidation of the right lung with a large loculated pleural effusion.  Typically in the setting, she would need urgent chest tube placement; discussed with Dr. Lanney Gins who recommended discussing with patient's daughter/HCPOA.  Patient's daughter has declined any invasive  measurements including chest tube but would like a trial of IV antibiotics with the understanding that her prognosis is very guarded.  - Continue BiPAP for now - Wean to supplemental oxygen via nasal cannula when able - Continue vancomycin per pharmacy dosing - Continue metronidazole twice daily - Transition to ceftriaxone as no pseudomonal risk factors - Depending on patient's clinical status, continue conversations with family regarding prognosis  Acute encephalopathy Patient has a history of underlying dementia, however is currently nonverbal and not following commands but will look to verbal stimuli.  Per patient's daughter, this is out of the range of her normal behavior.  Likely in the setting of acute infection with possible hypoactive delirium.  Persistent atrial fibrillation ([place]) Patient in atrial fibrillation, with occasional rapid ventricular rate.  - Continue home metoprolol and Eliquis  Insulin dependent type 2 diabetes mellitus (HCC) - SSI, moderate - Semglee 6 units nightly  CKD (chronic kidney disease), stage III ([place]) Renal function currently at baseline.  - Monitor renal function while admitted and receiving nephrotoxic agents  Advance Care Planning:   Code Status: DNR.  Per ED providers discussion with patient's daughter, they would not want any invasive measurements to be taken including resuscitation.  In addition, they would not want a chest tube placed.  Consults: Pulmonology  Family Communication: No family at bedside.  ED provider did update patient's daughter via telephone   Severity of Illness: The appropriate patient status for this patient is INPATIENT. Inpatient status is judged to be reasonable and necessary in order to provide the required intensity of service to ensure the patient's safety. The patient's presenting symptoms, physical exam findings, and initial radiographic and laboratory data in the context of their chronic comorbidities is felt to  place them at high risk for further clinical deterioration. Furthermore, it is not anticipated that the patient will be medically stable for discharge from the hospital within 2 midnights of admission.   * I certify that at the point of admission it is my clinical judgment that the patient will require inpatient hospital care spanning beyond 2 midnights from the point of admission due to high intensity of service, high risk for further deterioration and high frequency of surveillance required.*  Author: Jose Persia, MD 09/14/2022 8:26 PM  For on call review [email or web address].

## 2022-09-14 NOTE — ED Triage Notes (Signed)
First Nurse Note:  Pt via EMS from [facility]. Pt was dx with URI 4 days ago, pt was on abx. Pt is lethargic per EMS. Pt is responsive to verbal stimuli. Per EMS, pt meets Code Sepsis criteria. Pt is normally A&Ox4.   HR 112 2L [place] Chronically 97% on 3L  10 ETCO2  26 RR  93/30 BP  18 G L hand (EMS gave 452mL of fluids)

## 2022-09-14 NOTE — ED Provider Notes (Signed)
[facility] Provider Note    Event Date/Time   First MD Initiated Contact with Patient 09/14/22 1531     (approximate)   History   Weakness  HPI  Michelle Vang is a 88 y.o. female  who presents to the emergency department from living facility today because of concern for increasing weakness and lethargy. Patient is unable to give any significant history and history is obtained from daughter at bedside.  Daughter states that she noticed her mother starting decline about a week ago.  Started becoming more lethargic.  Having a harder time feeding herself.  Chest x-ray performed through the living facility showed a possible pneumonia so she was started on doxycycline.  She has now had about 2 days worth of the antibiotic without any improvement. The patient has not been complaining of any pain.      Physical Exam   Triage Vital Signs: ED Triage Vitals  Enc Vitals Group     BP 09/14/22 1406 (!) 115/52     Pulse Rate 09/14/22 1406 98     Resp 09/14/22 1406 (!) 22     Temp 09/14/22 1406 97.7 F (36.5 C)     Temp Source 09/14/22 1406 Axillary     SpO2 09/14/22 1406 94 %     Weight 09/14/22 1402 201 lb (91.2 kg)     Height 09/14/22 1402 5\' 4"  (1.626 m)     Head Circumference --      Peak Flow --      Pain Score --      Pain Loc --      Pain Edu? --      Excl. in [place]? --     Most recent vital signs: Vitals:   09/14/22 1406  BP: (!) 115/52  Pulse: 98  Resp: (!) 22  Temp: 97.7 F (36.5 C)  SpO2: 94%   General: Awake, alert.  CV:  Good peripheral perfusion. Irregularly irregular rate.  Resp:  Normal effort. Decreased air movement in right lower lung Abd:  No distention. Non tender.    ED Results / Procedures / Treatments   Labs (all labs ordered are listed, but only abnormal results are displayed) Labs Reviewed  COMPREHENSIVE METABOLIC PANEL - Abnormal; Notable for the following components:      Result Value   Sodium 148 (*)    Chloride  117 (*)    Glucose, Bld 329 (*)    BUN 46 (*)    Creatinine, Ser 1.26 (*)    Calcium 7.8 (*)    Total Protein 5.9 (*)    Albumin 1.8 (*)    AST 60 (*)    ALT 45 (*)    Alkaline Phosphatase 144 (*)    GFR, Estimated 40 (*)    All other components within normal limits  LACTIC ACID, PLASMA - Abnormal; Notable for the following components:   Lactic Acid, Venous 3.0 (*)    All other components within normal limits  CBC WITH DIFFERENTIAL/PLATELET - Abnormal; Notable for the following components:   WBC 23.5 (*)    RBC 3.54 (*)    Hemoglobin 10.8 (*)    HCT 34.2 (*)    Neutro Abs 18.0 (*)    Monocytes Absolute 3.2 (*)    Abs Immature Granulocytes 0.55 (*)    All other components within normal limits  PROTIME-INR - Abnormal; Notable for the following components:   Prothrombin Time 26.9 (*)    INR 2.5 (*)  All other components within normal limits  RESP PANEL BY RT-PCR (RSV, FLU A&B, COVID)  RVPGX2  CULTURE, BLOOD (ROUTINE X 2)  CULTURE, BLOOD (ROUTINE X 2)  LACTIC ACID, PLASMA  URINALYSIS, ROUTINE W REFLEX MICROSCOPIC  PATHOLOGIST SMEAR REVIEW     EKG  I, Nance Pear, attending physician, personally viewed and interpreted this EKG  EKG Time: 1402 Rate: 109 Rhythm: atrial fibrillation Axis: left axis deviation Intervals: qtc 441 QRS: narrow ST changes: no st elevation Impression: abnormal ekg    RADIOLOGY I independently interpreted and visualized the CXR. My interpretation: Right sided pleural effusion Radiology interpretation:  IMPRESSION:  There is marked interval increase in opacification of right  hemithorax. This may suggest interval appearance of  atelectasis/pneumonia and large pleural effusion. Some of the  effusion appears to be loculated in the lateral aspect of right  upper and right mid lung fields. Possibility of empyema is not  excluded. Follow-up CT chest may be considered.   I independently interpreted and visualized the CT chest with  contrast. My interpretation: loculated pleural effusion Radiology interpretation:  IMPRESSION:  1. Volume loss right thorax with post lobectomy changes. Near  complete consolidation of residual right lung parenchyma, possible  pneumonia. Areas of central low density within the consolidation  raising possibility of necrosis, underlying mass not excluded. Fluid  and or debris within right bronchi. Large loculated right pleural  effusion without internal gas bubbles or strong rim enhancement but  some increased density within the fluid at the right base suggesting  non simple fluid.  2. Clumped appearing consolidation at the left apical lung measuring  up to 1.1 cm, indeterminate for infection or pulmonary nodule. Short  interval CT follow-up is advised.  3. 2.4 cm hypodensity exophytic to upper pole right kidney not  entirely consistent with simple cyst. Recommend nonemergent renal  ultrasound for further evaluation.  4. Age indeterminate mild superior endplate deformities at T2 and  T4.  5. Aortic atherosclerosis.      PROCEDURES:  Critical Care performed: Yes, see critical care procedure note(s)  Procedures  CRITICAL CARE Performed by: Nance Pear   Total critical care time: 40 minutes  Critical care time was exclusive of separately billable procedures and treating other patients.  Critical care was necessary to treat or prevent imminent or life-threatening deterioration.  Critical care was time spent personally by me on the following activities: development of treatment plan with patient and/or surrogate as well as nursing, discussions with consultants, evaluation of patient's response to treatment, examination of patient, obtaining history from patient or surrogate, ordering and performing treatments and interventions, ordering and review of laboratory studies, ordering and review of radiographic studies, pulse oximetry and re-evaluation of patient's  condition.   MEDICATIONS ORDERED IN ED: Medications - No data to display   IMPRESSION / MDM / [place] / ED COURSE  I reviewed the triage vital signs and the nursing notes.                              Differential diagnosis includes, but is not limited to, pneumonia, UTI, covid, influenza, AKI, anemia.  Patient's presentation is most consistent with acute presentation with potential threat to life or bodily function.  The patient is on the cardiac monitor to evaluate for evidence of arrhythmia and/or significant heart rate changes.  Patient presented to the emergency department today because of concern for increased weakness and lethargy. Blood work obtained  from triage prior to my evaluation is concerning for infection with elevated WBC count and elevated lactic acidosis. CXR concerning for effusion and possible pneumonia, with possibility of empyema. CT scan was ordered and does show loculated pleural effusion. No rim enhancement or free air. Did have a discussion with patient's daughter. At this time they do not feel they would want a chest tube or invasive procedure. They would like to try antibiotics and fluid. I think this is reasonable given the patient's age and risk benefit of chest tube. Discussed with Dr. Charleen Kirks with the hospitalist service who will plan on admission.   On reassessment after IV fluids patient does seem more awake and alert.   FINAL CLINICAL IMPRESSION(S) / ED DIAGNOSES   Final diagnoses:  Pneumonia of right lung due to infectious organism, unspecified part of lung  Pleural effusion     Note:  This document was prepared using Dragon voice recognition software and may include unintentional dictation errors.    Nance Pear, MD 09/14/22 2031

## 2022-09-14 NOTE — Code Documentation (Signed)
CODE SEPSIS - PHARMACY COMMUNICATION  **Broad Spectrum Antibiotics should be administered within 1 hour of Sepsis diagnosis**  Time Code Sepsis Called/Page Received: 1541  Antibiotics Ordered: cefepime, vancomycin  Time of 1st antibiotic administration: [place] Kylan Veach ,PharmD Clinical Pharmacist  09/14/2022  3:51 PM

## 2022-09-14 NOTE — ED Notes (Signed)
Pt found with increased WOB, tachycardic and increased respiratory rate. O2 Sats remaining in mid 90's. Dr, Archie Balboa notified. Per verbal order, respiratory called to place pt on Bi-pap.

## 2022-09-14 NOTE — Sepsis Progress Note (Signed)
Elink following code sepsis °

## 2022-09-14 NOTE — Assessment & Plan Note (Addendum)
Patient presenting with several day history of upper respiratory symptoms on doxycycline found to have near complete consolidation of the right lung with a large loculated pleural effusion.  Typically in the setting, she would need urgent chest tube placement; discussed with Dr. Lanney Gins who recommended discussing with patient's daughter/HCPOA.  Patient's daughter has declined any invasive measurements including chest tube but would like a trial of IV antibiotics with the understanding that her prognosis is very guarded.  - Continue BiPAP for now - Wean to supplemental oxygen via nasal cannula when able - Continue vancomycin per pharmacy dosing - Continue metronidazole twice daily - Transition to ceftriaxone as no pseudomonal risk factors - Depending on patient's clinical status, continue conversations with family regarding prognosis

## 2022-09-14 NOTE — Assessment & Plan Note (Signed)
Renal function currently at baseline.  - Monitor renal function while admitted and receiving nephrotoxic agents

## 2022-09-15 ENCOUNTER — Encounter: Payer: Self-pay | Admitting: Internal Medicine

## 2022-09-15 ENCOUNTER — Other Ambulatory Visit: Payer: Self-pay

## 2022-09-15 DIAGNOSIS — J189 Pneumonia, unspecified organism: Secondary | ICD-10-CM | POA: Diagnosis not present

## 2022-09-15 DIAGNOSIS — J918 Pleural effusion in other conditions classified elsewhere: Secondary | ICD-10-CM

## 2022-09-15 DIAGNOSIS — N1831 Chronic kidney disease, stage 3a: Secondary | ICD-10-CM | POA: Diagnosis not present

## 2022-09-15 DIAGNOSIS — G9340 Encephalopathy, unspecified: Secondary | ICD-10-CM | POA: Diagnosis not present

## 2022-09-15 DIAGNOSIS — I4819 Other persistent atrial fibrillation: Secondary | ICD-10-CM | POA: Diagnosis not present

## 2022-09-15 LAB — COMPREHENSIVE METABOLIC PANEL
ALT: 44 U/L (ref 0–44)
AST: 51 U/L — ABNORMAL HIGH (ref 15–41)
Albumin: 1.9 g/dL — ABNORMAL LOW (ref 3.5–5.0)
Alkaline Phosphatase: 141 U/L — ABNORMAL HIGH (ref 38–126)
Anion gap: 9 (ref 5–15)
BUN: 44 mg/dL — ABNORMAL HIGH (ref 8–23)
CO2: 26 mmol/L (ref 22–32)
Calcium: 8.3 mg/dL — ABNORMAL LOW (ref 8.9–10.3)
Chloride: 113 mmol/L — ABNORMAL HIGH (ref 98–111)
Creatinine, Ser: 1.02 mg/dL — ABNORMAL HIGH (ref 0.44–1.00)
GFR, Estimated: 52 mL/min — ABNORMAL LOW (ref >60)
Glucose, Bld: 229 mg/dL — ABNORMAL HIGH (ref 70–99)
Potassium: 4.2 mmol/L (ref 3.5–5.1)
Sodium: 148 mmol/L — ABNORMAL HIGH (ref 135–145)
Total Bilirubin: 1 mg/dL (ref 0.3–1.2)
Total Protein: 6.6 g/dL (ref 6.5–8.1)

## 2022-09-15 LAB — CBC WITH DIFFERENTIAL/PLATELET
Abs Immature Granulocytes: 0.7 10*3/uL — ABNORMAL HIGH (ref 0.00–0.07)
Basophils Absolute: 0.2 10*3/uL — ABNORMAL HIGH (ref 0.0–0.1)
Basophils Relative: 1 %
Eosinophils Absolute: 0 10*3/uL (ref 0.0–0.5)
Eosinophils Relative: 0 %
HCT: 37.4 % (ref 36.0–46.0)
Hemoglobin: 11.6 g/dL — ABNORMAL LOW (ref 12.0–15.0)
Immature Granulocytes: 2 %
Lymphocytes Relative: 5 %
Lymphs Abs: 1.5 10*3/uL (ref 0.7–4.0)
MCH: 30.4 pg (ref 26.0–34.0)
MCHC: 31 g/dL (ref 30.0–36.0)
MCV: 97.9 fL (ref 80.0–100.0)
Monocytes Absolute: 3.6 10*3/uL — ABNORMAL HIGH (ref 0.1–1.0)
Monocytes Relative: 12 %
Neutro Abs: 22.6 10*3/uL — ABNORMAL HIGH (ref 1.7–7.7)
Neutrophils Relative %: 80 %
Platelets: 346 10*3/uL (ref 150–400)
RBC: 3.82 MIL/uL — ABNORMAL LOW (ref 3.87–5.11)
RDW: 13.5 % (ref 11.5–15.5)
Smear Review: NORMAL
WBC: 28.6 10*3/uL — ABNORMAL HIGH (ref 4.0–10.5)
nRBC: 0 % (ref 0.0–0.2)

## 2022-09-15 LAB — URINALYSIS, ROUTINE W REFLEX MICROSCOPIC
Bacteria, UA: NONE SEEN
Bilirubin Urine: NEGATIVE
Glucose, UA: 50 mg/dL — AB
Ketones, ur: NEGATIVE mg/dL
Nitrite: NEGATIVE
Protein, ur: 30 mg/dL — AB
RBC / HPF: >50 RBC/hpf — ABNORMAL HIGH (ref 0–5)
Specific Gravity, Urine: 1.027 (ref 1.005–1.030)
pH: 6 (ref 5.0–8.0)

## 2022-09-15 LAB — MRSA NEXT GEN BY PCR, NASAL: MRSA by PCR Next Gen: NOT DETECTED

## 2022-09-15 LAB — CBG MONITORING, ED
Glucose-Capillary: 205 mg/dL — ABNORMAL HIGH (ref 70–99)
Glucose-Capillary: 235 mg/dL — ABNORMAL HIGH (ref 70–99)
Glucose-Capillary: 239 mg/dL — ABNORMAL HIGH (ref 70–99)

## 2022-09-15 LAB — HIV ANTIBODY (ROUTINE TESTING W REFLEX): HIV Screen 4th Generation wRfx: NONREACTIVE

## 2022-09-15 LAB — STREP PNEUMONIAE URINARY ANTIGEN: Strep Pneumo Urinary Antigen: NEGATIVE

## 2022-09-15 MED ORDER — DEXTROSE IN LACTATED RINGERS 5 % IV SOLN: INTRAVENOUS | Status: DC

## 2022-09-15 MED ORDER — METHYLPREDNISOLONE SODIUM SUCC 125 MG IJ SOLR
60.0000 mg | Freq: Every day | INTRAMUSCULAR | Status: DC
  Administered 2022-09-15 – 2022-09-16 (×2): 60 mg via INTRAVENOUS
  Filled 2022-09-15 (×3): qty 2

## 2022-09-15 MED ORDER — MORPHINE SULFATE (PF) 2 MG/ML IV SOLN
0.5000 mg | Freq: Once | INTRAVENOUS | Status: AC
  Administered 2022-09-15: 0.5 mg via INTRAVENOUS
  Filled 2022-09-15: qty 1

## 2022-09-15 MED ORDER — SODIUM CHLORIDE 0.45 % IV SOLN: INTRAVENOUS | Status: DC

## 2022-09-15 NOTE — ED Notes (Signed)
This nurse tried to give this pt morning meds with applesauce and water but pt is not swallowing them. Instead she keeps rolling them in her mouth. As of now, pt is unable to follow basic commands like swallowing.

## 2022-09-15 NOTE — ED Notes (Signed)
This nurse gave family a small supply of drink thickener and showed how to correctly use the thickener. Family verbalized that they understood and that it was used to see if the pt won't aspirate as much while drinking.

## 2022-09-15 NOTE — Progress Notes (Signed)
[facility] [place] NAME: Michelle Vang    MR#:  [id number]  DATE OF BIRTH:  May 15, 1929  SUBJECTIVE:  at baseline patient has dementia. Daughter [name] at bedside communicated through her. Patient is at the facility for long-term. Found her shortness of breath brought to the emergency room found to have near complete consolidation of the right lung. Wheezing during my evaluation. Received breathing treatment a bit earlier. Tachycardia. On oxygen.    VITALS:  Blood pressure (!) 113/58, pulse (!) 106, temperature 97.6 F (36.4 C), temperature source Axillary, resp. rate 20, height 5\' 4"  (1.626 m), weight 91.2 kg, SpO2 99 %.  PHYSICAL EXAMINATION:   GENERAL:  89 y.o.-year-old patient lying in the bed with no acute distress.  LUNGS: Normal breath sounds bilaterally, + wheezing CARDIOVASCULAR: S1, S2 normal. No murmur  tachycardia ABDOMEN: Soft, nontender, nondistended. Bowel sounds present.  EXTREMITIES: No  edema b/l.    NEUROLOGIC: nonfocal  patient is awake, dementia at baseline SKIN:per RN  LABORATORY PANEL:  CBC Recent Labs  Lab 09/15/22 0607  WBC 28.6*  HGB 11.6*  HCT 37.4  PLT 346    Chemistries  Recent Labs  Lab 09/15/22 0607  NA 148*  K 4.2  CL 113*  CO2 26  GLUCOSE 229*  BUN 44*  CREATININE 1.02*  CALCIUM 8.3*  AST 51*  ALT 44  ALKPHOS 141*  BILITOT 1.0   Cardiac Enzymes No results for input(s): "TROPONINI" in the last 168 hours. RADIOLOGY:  CT Chest W Contrast  Result Date: 09/14/2022 CLINICAL DATA:  Right-sided effusion question empyema EXAM: CT CHEST WITH CONTRAST TECHNIQUE: Multidetector CT imaging of the chest was performed during intravenous contrast administration. RADIATION DOSE REDUCTION: This exam was performed according to the departmental dose-optimization program which includes automated exposure control, adjustment of the mA and/or kV according to patient size and/or use of iterative  reconstruction technique. CONTRAST:  84mL OMNIPAQUE IOHEXOL 300 MG/ML  SOLN COMPARISON:  Chest x-ray 09/14/2022, PET CT 07/16/2010 FINDINGS: Cardiovascular: Moderate aortic atherosclerosis. No aneurysm. No dissection. Postsurgical changes of the right pulmonary artery. Mild coronary vascular calcification. Borderline cardiomegaly. No pericardial effusion. Mediastinum/Nodes: Midline trachea. No suspicious thyroid mass. Subcentimeter mediastinal lymph nodes. Mild air distension of upper esophagus. Esophagus otherwise unremarkable. Lungs/Pleura: Volume loss right thorax with postsurgical changes compatible with previous right lobectomies. Suspected fluid and or debris within distal right bronchi. Near complete consolidation of the residual right lung with areas of central hypodensity, potentially due to areas of necrosis. Large multi loculated right pleural effusion. No internal gas collections. Some areas of increased internal density within the fluid at the right lung base, consistent with non simple fluid. Left lung demonstrates mild mosaic density potentially due to small airways disease. Clumped appearing consolidation at the left apical lung measuring 1 point 8 x 1.1 cm, series 3, image 36. Upper Abdomen: No acute finding. 2.4 cm hypodensity exophytic to upper pole right kidney not entirely consistent with simple cyst. Musculoskeletal: Sternum is intact. Age indeterminate mild superior endplate deformities at T2 and T4. IMPRESSION: 1. Volume loss right thorax with post lobectomy changes. Near complete consolidation of residual right lung parenchyma, possible pneumonia. Areas of central low density within the consolidation raising possibility of necrosis, underlying mass not excluded. Fluid and or debris within right bronchi. Large loculated right pleural effusion without internal gas bubbles or strong rim enhancement but some increased density within the fluid at the right base suggesting non simple fluid. 2.  Clumped appearing consolidation at the left apical lung measuring up to 1.1 cm, indeterminate for infection or pulmonary nodule. Short interval CT follow-up is advised. 3. 2.4 cm hypodensity exophytic to upper pole right kidney not entirely consistent with simple cyst. Recommend nonemergent renal ultrasound for further evaluation. 4. Age indeterminate mild superior endplate deformities at T2 and T4. 5. Aortic atherosclerosis. Aortic Atherosclerosis (ICD10-I70.0). Electronically Signed   By: Donavan Foil M.D.   On: 09/14/2022 16:55   DG Chest Port 1 View  Result Date: 09/14/2022 CLINICAL DATA:  Sepsis EXAM: PORTABLE CHEST 1 VIEW COMPARISON:  06/26/2022 FINDINGS: There is almost complete opacification of right hemithorax. There is small area of aeration in the medial right upper and right mid lung fields. Transverse diameter of heart is increased. No focal abnormalities are seen in left lung. Left lateral CP angle is clear. IMPRESSION: There is marked interval increase in opacification of right hemithorax. This may suggest interval appearance of atelectasis/pneumonia and large pleural effusion. Some of the effusion appears to be loculated in the lateral aspect of right upper and right mid lung fields. Possibility of empyema is not excluded. Follow-up CT chest may be considered. Electronically Signed   By: Elmer Picker M.D.   On: 09/14/2022 14:29    Assessment and Plan  Michelle Vang is a 89 y.o. female with medical history significant of dementia, hypothyroidism, hypertension, persistent atrial fibrillation, stage IIIa CKD, CVA, type 2 diabetes, who presents to the ED with complaints of lethargy.  atient currently resides at [facility]. She was diagnosed with an upper respiratory infection approximately 4 days ago and was started on doxycycline. Today, she became increasingly lethargic although remained responsive to verbal stimuli.  CT chest was obtained that demonstrated with near complete  consolidation of the residual right lung with possible pneumonia and areas concerning for necrosis. Large loculated right pleural effusion without evidence of internal gas bubbles.  Sepsis secondary to pneumonia right-sided with completed pacification/consolidation with possible pleural effusion/empyema -- chest x-ray shows worsening infiltrated/consolidation on the right conformant with CT scan -- daughters do not want any aggressive measures including placement of chest tube -- continue broad-spectrum antibiotic -- blood culture negative -- continue IV fluids -- lactic acid 3.0-- 2.8 -- elevated white count 23K-- 28.6 -- keep patient NPO. Speech therapy to see patient  Acute on chronic CKD stage IIIB hypernatremia -- IV half normal saline -- monitor input output -- avoid nephrotoxic agents  History of persistent a fib with occasional rapid ventricular rate -- continue to monitor  Acute encephalopathy-- mentation improving (pt awake) patient has dementia currently nonverbal -- recognizes her daughter  Type II diabetes, uncontrolled, hyperglycemia -- sliding scale insulin  Remote history of lung cancer status post lobectomy  History of hypertension -- IV PRN hydralazine since patient is NPO  Discussed at length with patient's daughters and they do understand patient is critically ill. Confirmed code status DNR DNI  Procedures: Family communication :dters sandy and penny Consults :none CODE STATUS: DNR/DNI DVT Prophylaxis :eliquis Level of care: Progressive Status is: Inpatient Remains inpatient appropriate because: severe respiratory failure with PNA    TOTAL TIME TAKING CARE OF THIS PATIENT: 35 minutes.  >50% time spent on counselling and coordination of care  Note: This dictation was prepared with Dragon dictation along with smaller phrase technology. Any transcriptional errors that result from this process are unintentional.  Fritzi Mandes M.D    [facility]   CC: Primary care physician; [facility]

## 2022-09-16 DIAGNOSIS — J189 Pneumonia, unspecified organism: Secondary | ICD-10-CM | POA: Diagnosis not present

## 2022-09-16 DIAGNOSIS — G9340 Encephalopathy, unspecified: Secondary | ICD-10-CM | POA: Diagnosis not present

## 2022-09-16 DIAGNOSIS — N1831 Chronic kidney disease, stage 3a: Secondary | ICD-10-CM | POA: Diagnosis not present

## 2022-09-16 DIAGNOSIS — I4819 Other persistent atrial fibrillation: Secondary | ICD-10-CM | POA: Diagnosis not present

## 2022-09-16 LAB — PATHOLOGIST SMEAR REVIEW

## 2022-09-16 LAB — CBG MONITORING, ED
Glucose-Capillary: 182 mg/dL — ABNORMAL HIGH (ref 70–99)
Glucose-Capillary: 193 mg/dL — ABNORMAL HIGH (ref 70–99)

## 2022-09-16 LAB — GLUCOSE, CAPILLARY: Glucose-Capillary: 381 mg/dL — ABNORMAL HIGH (ref 70–99)

## 2022-09-16 NOTE — Progress Notes (Signed)
[facility] [place] NAME: Michelle Vang    MR#:  [id number]  DATE OF BIRTH:  04/11/1929  SUBJECTIVE:  at baseline patient has dementia.  NO family present during my evaluation earlier  Patient is at the facility for long-term. Found her shortness of breath brought to the emergency room found to have near complete consolidation of the right lung. Appears more comfortable today. No fever   VITALS:  Blood pressure (!) 144/73, pulse 94, temperature 97.6 F (36.4 C), temperature source Oral, resp. rate 20, height 5\' 4"  (1.626 m), weight 91.2 kg, SpO2 97 %.  PHYSICAL EXAMINATION:   GENERAL:  89 y.o.-year-old patient lying in the bed with no acute distress.  LUNGS: Normal breath sounds bilaterally, no wheezing CARDIOVASCULAR: S1, S2 normal. No murmur  tachycardia ABDOMEN: Soft, nontender, nondistended. EXTREMITIES: No  edema b/l.    NEUROLOGIC: unable to assess due to dementia at baseline SKIN:per RN  LABORATORY PANEL:  CBC Recent Labs  Lab 09/15/22 0607  WBC 28.6*  HGB 11.6*  HCT 37.4  PLT 346     Chemistries  Recent Labs  Lab 09/15/22 0607  NA 148*  K 4.2  CL 113*  CO2 26  GLUCOSE 229*  BUN 44*  CREATININE 1.02*  CALCIUM 8.3*  AST 51*  ALT 44  ALKPHOS 141*  BILITOT 1.0    Cardiac Enzymes No results for input(s): "TROPONINI" in the last 168 hours. RADIOLOGY:  CT Chest W Contrast  Result Date: 09/14/2022 CLINICAL DATA:  Right-sided effusion question empyema EXAM: CT CHEST WITH CONTRAST TECHNIQUE: Multidetector CT imaging of the chest was performed during intravenous contrast administration. RADIATION DOSE REDUCTION: This exam was performed according to the departmental dose-optimization program which includes automated exposure control, adjustment of the mA and/or kV according to patient size and/or use of iterative reconstruction technique. CONTRAST:  71mL OMNIPAQUE IOHEXOL 300 MG/ML  SOLN COMPARISON:  Chest x-ray  09/14/2022, PET CT 07/16/2010 FINDINGS: Cardiovascular: Moderate aortic atherosclerosis. No aneurysm. No dissection. Postsurgical changes of the right pulmonary artery. Mild coronary vascular calcification. Borderline cardiomegaly. No pericardial effusion. Mediastinum/Nodes: Midline trachea. No suspicious thyroid mass. Subcentimeter mediastinal lymph nodes. Mild air distension of upper esophagus. Esophagus otherwise unremarkable. Lungs/Pleura: Volume loss right thorax with postsurgical changes compatible with previous right lobectomies. Suspected fluid and or debris within distal right bronchi. Near complete consolidation of the residual right lung with areas of central hypodensity, potentially due to areas of necrosis. Large multi loculated right pleural effusion. No internal gas collections. Some areas of increased internal density within the fluid at the right lung base, consistent with non simple fluid. Left lung demonstrates mild mosaic density potentially due to small airways disease. Clumped appearing consolidation at the left apical lung measuring 1 point 8 x 1.1 cm, series 3, image 36. Upper Abdomen: No acute finding. 2.4 cm hypodensity exophytic to upper pole right kidney not entirely consistent with simple cyst. Musculoskeletal: Sternum is intact. Age indeterminate mild superior endplate deformities at T2 and T4. IMPRESSION: 1. Volume loss right thorax with post lobectomy changes. Near complete consolidation of residual right lung parenchyma, possible pneumonia. Areas of central low density within the consolidation raising possibility of necrosis, underlying mass not excluded. Fluid and or debris within right bronchi. Large loculated right pleural effusion without internal gas bubbles or strong rim enhancement but some increased density within the fluid at the right base suggesting non simple fluid. 2. Clumped appearing consolidation at the left apical lung measuring  up to 1.1 cm, indeterminate for  infection or pulmonary nodule. Short interval CT follow-up is advised. 3. 2.4 cm hypodensity exophytic to upper pole right kidney not entirely consistent with simple cyst. Recommend nonemergent renal ultrasound for further evaluation. 4. Age indeterminate mild superior endplate deformities at T2 and T4. 5. Aortic atherosclerosis. Aortic Atherosclerosis (ICD10-I70.0). Electronically Signed   By: Donavan Foil M.D.   On: 09/14/2022 16:55   DG Chest Port 1 View  Result Date: 09/14/2022 CLINICAL DATA:  Sepsis EXAM: PORTABLE CHEST 1 VIEW COMPARISON:  06/26/2022 FINDINGS: There is almost complete opacification of right hemithorax. There is small area of aeration in the medial right upper and right mid lung fields. Transverse diameter of heart is increased. No focal abnormalities are seen in left lung. Left lateral CP angle is clear. IMPRESSION: There is marked interval increase in opacification of right hemithorax. This may suggest interval appearance of atelectasis/pneumonia and large pleural effusion. Some of the effusion appears to be loculated in the lateral aspect of right upper and right mid lung fields. Possibility of empyema is not excluded. Follow-up CT chest may be considered. Electronically Signed   By: Elmer Picker M.D.   On: 09/14/2022 14:29    Assessment and Plan  Michelle Vang is a 89 y.o. female with medical history significant of dementia, hypothyroidism, hypertension, persistent atrial fibrillation, stage IIIa CKD, CVA, type 2 diabetes, who presents to the ED with complaints of lethargy.  atient currently resides at [facility]. She was diagnosed with an upper respiratory infection approximately 4 days ago and was started on doxycycline. Today, she became increasingly lethargic although remained responsive to verbal stimuli.  CT chest was obtained that demonstrated with near complete consolidation of the residual right lung with possible pneumonia and areas concerning for necrosis.  Large loculated right pleural effusion without evidence of internal gas bubbles.  Sepsis secondary to pneumonia right-sided with completed pacification/consolidation with possible pleural effusion/empyema -- chest x-ray shows worsening infiltrated/consolidation on the right conformant with CT scan -- daughters do not want any aggressive measures including placement of chest tube -- continue broad-spectrum antibiotic -- blood culture negative -- continue IV fluids -- lactic acid 3.0-- 2.8 -- elevated white count 23K-- 28.6 -- keep patient NPO. Speech therapy to see patient  Acute on chronic CKD stage IIIB hypernatremia -- IV half normal saline -- monitor input output -- avoid nephrotoxic agents --BMP In am  History of persistent a fib with occasional rapid ventricular rate -- continue to monitor  Acute encephalopathy-- mentation improving (pt awake) patient has dementia currently nonverbal -- recognizes her daughter  Type II diabetes, uncontrolled, hyperglycemia -- sliding scale insulin  Remote history of lung cancer status post lobectomy  History of hypertension -- IV PRN hydralazine since patient is NPO  Discussed at length with patient's daughters and they do understand patient is critically ill. Confirmed code status DNR DNI   Family communication :dter penny Consults :none CODE STATUS: DNR/DNI DVT Prophylaxis :eliquis Level of care: Progressive Status is: Inpatient Remains inpatient appropriate because: severe respiratory failure with PNA    TOTAL TIME TAKING CARE OF THIS PATIENT: 35 minutes.  >50% time spent on counselling and coordination of care  Note: This dictation was prepared with Dragon dictation along with smaller phrase technology. Any transcriptional errors that result from this process are unintentional.  Fritzi Mandes M.D    [facility]   CC: Primary care physician; [facility]

## 2022-09-16 NOTE — ED Notes (Signed)
Patient moved to hospital bed for comfort.  Patient incontinent of stool and urine.  Sheets and adult brief changed and new ones placed.  New PureWick placed.  Patient repositioned in bed and pillows placed under pressure points.  Daughter remains at bedside and updated on plan of care.

## 2022-09-16 NOTE — Evaluation (Signed)
Clinical/Bedside Swallow Evaluation Patient Details  Name: Michelle Vang MRN: [id number] Date of Birth: 09-Apr-1929  Today's Date: 09/16/2022 Time: SLP Start Time (ACUTE ONLY): 81 SLP Stop Time (ACUTE ONLY): 1335 SLP Time Calculation (min) (ACUTE ONLY): 60 min  Past Medical History:  Past Medical History:  Diagnosis Date   Bladder cancer ([place])    Diabetes mellitus without complication ([place])    GERD (gastroesophageal reflux disease)    Hypertension    Lung cancer ([place])    Thyroid disease    Past Surgical History:  Past Surgical History:  Procedure Laterality Date   APPLICATION OF WOUND VAC Left 03/24/2021   Procedure: APPLICATION OF PREVENA WOUND VAC;  Surgeon: Leim Fabry, MD;  Location: [facility] ORS;  Service: Orthopedics;  Laterality: Left;   CYSTOSCOPY     I & D EXTREMITY Left 03/24/2021   Procedure: IRRIGATION AND DEBRIDEMENT EXTREMITY;  Surgeon: Leim Fabry, MD;  Location: [facility] ORS;  Service: Orthopedics;  Laterality: Left;   LUNG CANCER SURGERY     Removed 1 lobe on Right lung   SKIN CANCER EXCISION  2014   Melanoma   HPI:  Pt is a 89 y.o. female with medical history significant of dementia, hypothyroidism, hypertension, persistent atrial fibrillation, stage IIIa CKD, CVA, type 2 diabetes, who presents to the ED with complaints of lethargy.  History obtained through chart review as patient is currently nonverbal and no family at bedside.     Per chart review, patient currently resides at [facility].  She was diagnosed with an upper respiratory infection approximately 4 days ago and was started on doxycycline.  CT of Chest:  Volume loss right thorax with postsurgical changes  compatible with previous right lobectomies. Suspected fluid and or  debris within distal right bronchi. Near complete consolidation of  the residual right lung with areas of central hypodensity,  potentially due to areas of necrosis. Large multi loculated right  pleural effusion. No internal gas  collections. Some areas of  increased internal density within the fluid at the right lung base,  consistent with non simple fluid. Left lung demonstrates mild mosaic  density potentially due to small airways disease. Clumped appearing  consolidation at the left apical lung.    Assessment / Plan / Recommendation  Clinical Impression   Pt seen for BSE this date. Pt awake, verbal to state her name but did not initiate any other conversation -- often smiled and nodded head. She followed basic 1-step commands w/ cues. Dementia baseline. Noted expiratory wheeze w/ breathing; min WOB at rest PRIOR TO any po's. Rest Breaks given throughout session for conservation of energy (and PRIOR TO PO'S GIVEN too d/t mildly increased WOB w/ any exertion). On [place] O2 support 2L; afebrile. WBC elevated.     Pt presents w/ grossly functional oropharyngeal phase swallowing w/ No overt neuromuscular deficits noted nor overt, clinical s/s of aspiration noted. OF NOTE, pt's Pulmonary status is declined at Baseline (prior to po's) w/ increased WOB w/ any exertion including sitting up in bed and talking -- this can increase risk for aspiration. ANY Pulmonary decline can impact Apnea timing and airway closure during the swallow which can impact pharyngeal swallowing, airway protection, and increase risk for aspiration to occur thus further Pulmonary impact/decline. Rest Breaks were given to aid in calming any increase WOB demand and support safer swallowing.     During this evaluation, trials of ice chips, sips of water Via Cup/Straw, then tsps of purees were presented w/ REST BREAKS  b/t trials and careful monitoring of pt's RR(mid 20s, O2 sats 97%). No overt clinical s/s of aspiration noted w/ trials given; hyolaryngeal excursion appeared adequate. Respiratory effort appeared to increase minimally w/ the effort from the exertion of oral bolus management but calmed again (to her baseline) w/ the REST BREAKS b/t trials. Swallowing of  liquids was timely. Single sips educated on for conservation of energy, calming breathing -- pinched straw to limit to 1-2 sips only. Pt followed all instructions w/ cues appropriately.  Oral phase appeared grossly [facility] for bolus management of trial consistencies given; oral clearing appropriate. OM exam was cursory but revealed no unilateral weakness. Missing Dentition - no upper denture plate. Pt helped to feed self by holding cup when drinking.   Pt is at increased risk for aspiration/aspiration pneumonia in setting of declined Pulmonary status, illness, advanced age, and Dementia Baseline. She needs feeding support at meals; Supervision.  Recommend a more Pureed diet w/ moistened foods(gravies) in setting of Missing Dentition to reduce exertion/demand -- conservation of energy and thins liquids; aspiration precautions. Pills given CRUSHED in Puree to lessen risk for aspiration. Supervision and Frequent REST BREAKS during oral intake w/ close monitoring of RR and increased mouth breathing. Support w/ feeding at meals as needed; positioning support for upright sitting for all oral intake. Stop feeding if any increased coughing noted.   ST services will monitor pt's status while admitted; can be available for further education while admitted if needed. MD updated on above; NSG and Daughter, [name], updated. Dietician updated.  SLP Visit Diagnosis: Dysphagia, oropharyngeal phase (R13.12) (generalized weakness, declined Pulmonary status impacting; Dementia baseline)    Aspiration Risk  Mild aspiration risk;Risk for inadequate nutrition/hydration (reduced following general precautions)    Diet Recommendation   a more Pureed diet w/ moistened foods(gravies) in setting of Missing Dentition to reduce exertion/demand -- conservation of energy and thins liquids; aspiration precautions. Supervision and Frequent REST BREAKS during oral intake w/ close monitoring of RR and increased mouth breathing. Support w/  feeding at meals as needed; positioning support for upright sitting for all oral intake. Stop feeding if any increased coughing noted.  Medication Administration: Crushed with puree    Other  Recommendations Recommended Consults:  (Palliative Care for [place]; Dietician f/u) Oral Care Recommendations: Oral care BID;Oral care before and after PO;Staff/trained caregiver to provide oral care Other Recommendations:  (n/a)    Recommendations for follow up therapy are one component of a multi-disciplinary discharge planning process, led by the attending physician.  Recommendations may be updated based on patient status, additional functional criteria and insurance authorization.  Follow up Recommendations Follow physician's recommendations for discharge plan and follow up therapies      Assistance Recommended at Discharge  Full to support at meals  Functional Status Assessment Patient has had a recent decline in their functional status and/or demonstrates limited ability to make significant improvements in function in a reasonable and predictable amount of time  Frequency and Duration min 2x/week  2 weeks       Prognosis Prognosis for Safe Diet Advancement: Fair Barriers to Reach Goals: Cognitive deficits;Time post onset;Severity of deficits Barriers/Prognosis Comment: generalized weakness, declined Pulmonary status impacting; Dementia baseline      Swallow Study   General Date of Onset: 09/14/22 HPI: Pt is a 89 y.o. female with medical history significant of dementia, hypothyroidism, hypertension, persistent atrial fibrillation, stage IIIa CKD, CVA, type 2 diabetes, who presents to the ED with complaints of lethargy.  History obtained  through chart review as patient is currently nonverbal and no family at bedside.     Per chart review, patient currently resides at [facility].  She was diagnosed with an upper respiratory infection approximately 4 days ago and was started on doxycycline.  CT  of Chest:  Volume loss right thorax with postsurgical changes  compatible with previous right lobectomies. Suspected fluid and or  debris within distal right bronchi. Near complete consolidation of  the residual right lung with areas of central hypodensity,  potentially due to areas of necrosis. Large multi loculated right  pleural effusion. No internal gas collections. Some areas of  increased internal density within the fluid at the right lung base,  consistent with non simple fluid. Left lung demonstrates mild mosaic  density potentially due to small airways disease. Clumped appearing  consolidation at the left apical lung. Type of Study: Bedside Swallow Evaluation Previous Swallow Assessment: none Diet Prior to this Study: NPO Temperature Spikes Noted: No (wbc elevated) Respiratory Status: Nasal cannula (2L) History of Recent Intubation: No Behavior/Cognition: Alert;Cooperative;Pleasant mood;Distractible;Requires cueing Oral Cavity Assessment: Dry Oral Care Completed by SLP: Yes Oral Cavity - Dentition: Missing dentition Vision: Functional for self-feeding Self-Feeding Abilities: Able to feed self;Needs set up;Needs assist;Total assist (helped to hold cup to drink) Patient Positioning: Upright in bed (needed supported positioning) Baseline Vocal Quality: Normal (min gravely) Volitional Cough: Cognitively unable to elicit Volitional Swallow: Unable to elicit    Oral/Motor/Sensory Function Overall Oral Motor/Sensory Function: Within functional limits (w/ general oral movements, bolus management, and oral clearing)   Ice Chips Ice chips: Within functional limits Presentation: Spoon (fed; 3 trials) Other Comments: min reduced lingual manipulation during management -- not much movement   Thin Liquid Thin Liquid: Within functional limits Presentation: Cup;Self Fed;Straw (3 via cup; ~10 sips via straw) Other Comments: REST BREAKS b/t trials to lessen WOB    Nectar Thick Nectar Thick Liquid:  Not tested   Honey Thick Honey Thick Liquid: Not tested   Puree Puree: Within functional limits Presentation: Spoon (fed; 10+ trials)   Solid     Solid: Not tested Other Comments: baseline WOB; poor dentition        Orinda Kenner, MS, CCC-SLP Speech Language Pathologist Rehab Services; [place] [phone] (ascom) Kamari Buch 09/16/2022,3:40 PM

## 2022-09-17 ENCOUNTER — Inpatient Hospital Stay: Payer: Medicare Other

## 2022-09-17 DIAGNOSIS — G9340 Encephalopathy, unspecified: Secondary | ICD-10-CM | POA: Diagnosis not present

## 2022-09-17 DIAGNOSIS — J918 Pleural effusion in other conditions classified elsewhere: Secondary | ICD-10-CM | POA: Diagnosis not present

## 2022-09-17 DIAGNOSIS — J189 Pneumonia, unspecified organism: Secondary | ICD-10-CM | POA: Diagnosis not present

## 2022-09-17 LAB — GLUCOSE, CAPILLARY
Glucose-Capillary: 377 mg/dL — ABNORMAL HIGH (ref 70–99)
Glucose-Capillary: 418 mg/dL — ABNORMAL HIGH (ref 70–99)
Glucose-Capillary: 333 mg/dL — ABNORMAL HIGH (ref 70–99)
Glucose-Capillary: 357 mg/dL — ABNORMAL HIGH (ref 70–99)
Glucose-Capillary: 287 mg/dL — ABNORMAL HIGH (ref 70–99)

## 2022-09-17 LAB — BASIC METABOLIC PANEL
Anion gap: 12 (ref 5–15)
BUN: 56 mg/dL — ABNORMAL HIGH (ref 8–23)
CO2: 20 mmol/L — ABNORMAL LOW (ref 22–32)
Calcium: 8.3 mg/dL — ABNORMAL LOW (ref 8.9–10.3)
Chloride: 114 mmol/L — ABNORMAL HIGH (ref 98–111)
Creatinine, Ser: 1.13 mg/dL — ABNORMAL HIGH (ref 0.44–1.00)
GFR, Estimated: 46 mL/min — ABNORMAL LOW (ref >60)
Glucose, Bld: 428 mg/dL — ABNORMAL HIGH (ref 70–99)
Potassium: 3.9 mmol/L (ref 3.5–5.1)
Sodium: 146 mmol/L — ABNORMAL HIGH (ref 135–145)

## 2022-09-17 LAB — CBC
HCT: 32.6 % — ABNORMAL LOW (ref 36.0–46.0)
Hemoglobin: 10.5 g/dL — ABNORMAL LOW (ref 12.0–15.0)
MCH: 30.5 pg (ref 26.0–34.0)
MCHC: 32.2 g/dL (ref 30.0–36.0)
MCV: 94.8 fL (ref 80.0–100.0)
Platelets: 376 10*3/uL (ref 150–400)
RBC: 3.44 MIL/uL — ABNORMAL LOW (ref 3.87–5.11)
RDW: 13.5 % (ref 11.5–15.5)
WBC: 24.1 10*3/uL — ABNORMAL HIGH (ref 4.0–10.5)
nRBC: 0 % (ref 0.0–0.2)

## 2022-09-17 LAB — LEGIONELLA PNEUMOPHILA SEROGP 1 UR AG: L. pneumophila Serogp 1 Ur Ag: NEGATIVE

## 2022-09-17 MED ORDER — METHYLPREDNISOLONE SODIUM SUCC 40 MG IJ SOLR
40.0000 mg | Freq: Every day | INTRAMUSCULAR | Status: DC
  Administered 2022-09-17 – 2022-09-18 (×2): 40 mg via INTRAVENOUS
  Filled 2022-09-17 (×2): qty 1

## 2022-09-17 MED ORDER — MORPHINE SULFATE (PF) 2 MG/ML IV SOLN
0.5000 mg | Freq: Once | INTRAVENOUS | Status: AC
  Administered 2022-09-17: 0.5 mg via INTRAVENOUS
  Filled 2022-09-17: qty 1

## 2022-09-17 NOTE — Inpatient Diabetes Management (Signed)
Inpatient Diabetes Program Recommendations  AACE/ADA: New Consensus Statement on Inpatient Glycemic Control   Target Ranges:  Prepandial:   less than 140 mg/dL      Peak postprandial:   less than 180 mg/dL (1-2 hours)      Critically ill patients:  140 - 180 mg/dL    Latest Reference Range & Units 09/16/22 08:42 09/16/22 12:31 09/16/22 22:00 09/17/22 07:48 09/17/22 07:49  Glucose-Capillary 70 - 99 mg/dL 182 (H) 193 (H) 381 (H) 418 (H) 377 (H)   Review of Glycemic Control  Diabetes history: DM2 Outpatient Diabetes medications: Lantus 12 units daily Current orders for Inpatient glycemic control: Semglee 6 units QHS, Novolog 0-15 units TID with meals; Solumedrol 60 mg daily  Inpatient Diabetes Program Recommendations:    Insulin: If steroids are continued as ordered, please consider increasing Semglee to 10 units daily and ordering Novolog 2 units TID with meals for meal coverage if patient eats at least 50% of meals.  Thanks, Barnie Alderman, RN, MSN, [place] Diabetes Coordinator Inpatient Diabetes Program [phone] (Team Pager from 8am to [place])

## 2022-09-17 NOTE — Progress Notes (Signed)
Speech Language Pathology Treatment: Dysphagia  Patient Details Name: Michelle Vang MRN: [id number] DOB: June 26, 1929 Today's Date: 09/17/2022 Time: 0569-7948 SLP Time Calculation (min) (ACUTE ONLY): 45 min  Assessment / Plan / Recommendation Clinical Impression  Pt seen for ongoing assessment of swallowing and toleration of diet; Caregiver present in room feeding pt  -- Education provided on aspiration precautions to utilize during feeding pt. Pt awake, smiled often but did not initiate conversation. She was easily distracted and impulsive, reaching for Cup to drink on tray table. Dementia baseline.  OF NOTE: noted expiratory wheeze w/ breathing; min WOB at rest and during po intake w/ Caregiver. This presentation was ALSO NOTED at evaluation both PRIOR TO and DURING po intake then. Rest Breaks were recommended w/ any oral intake for conservation of energy as well as the PUREED diet. On [place] O2 support 2-3L; afebrile. WBC elevated.     Pt presents w/ grossly functional oropharyngeal phase swallowing w/ No immediate, overt neuromuscular deficits noted nor clinical s/s of aspiration. OF NOTE, pt's Pulmonary status is declined at Baseline (prior to po's) w/ increased WOB w/ any exertion including sitting up in bed, taking po's -- this can increase risk for aspiration. ANY Pulmonary decline can impact Apnea timing and airway closure during the swallow which can impact pharyngeal swallowing, airway protection, and increase risk for aspiration to occur thus further Pulmonary impact/decline. Rest Breaks were strongly recommended and given to aid in calming the increase WOB demand and to support safer swallowing at this meal.     During oral intake, pt consumed sips of thin liquids Via Straw(PINCHED straw to limit bolus size and Impulsive drinking), and tsps of purees of meal. Caregiver had fed pt MOST of the meal and pt appeared to have min increased WOB -- suspect potential fatigue w/ eating/drinking at the  time.  A REST BREAK was recommended and then when pt returned to po intake, REST BREAKS were instructed b/t trials w/ careful monitoring of pt's RR effort. Educated on the need to eat SMALLER meals more frequently for conservation of energy.  No immediate, overt clinical s/s of aspiration noted w/ po's consumed; but delayed congested coughing noted x1, throat clearing x1 post Belching and multiple swallows. Unsure if related to Esophageal backflow or to oropharyngeal swallowing/aspiration. REST BREAKS given more consistently b/t trials. Swallowing of po's was fairly timely; Single sips educated on and need to Pinch straw to limit to 1-2 small sips only.   Oral phase appeared grossly [facility] for bolus management of trial consistencies given; oral clearing appropriate given min Time occasionally.    Pt is at increased risk for aspiration/aspiration pneumonia in setting of declined Pulmonary status, illness, advanced age, and Dementia Baseline. She needs feeding support at meals; Supervision to Pinch straw to take SMALL sips -- she is Impulsive w/ her drinking.   Recommend continue a Pureed diet w/ moistened foods(gravies) in setting of Missing Dentition to reduce exertion/demand -- conservation of energy and thins liquids PINCHING straw to limit bolus size; aspiration precautions. Pills given CRUSHED in Puree to lessen risk for aspiration. Supervision and Frequent REST BREAKS during oral intake w/ close monitoring of RR and increased mouth breathing. NSG support w/ feeding at meals; positioning support for upright sitting for all oral intake. Stop feeding if any increased coughing noted.   Strongly recommend discussion w/ Family re: pt's risks w/ oral intake at any given time d/t POOR Pulmonary status and Dementia at Baseline. MD ordered Palliative Care to consult  for [place] discussion moving forward.   ST services will monitor pt's status while admitted; can be available for further education while admitted if  needed. MD/NSG updated on above; Education completed w/ Caregiver present.       HPI HPI: Pt is a 89 y.o. female with medical history significant of dementia, hypothyroidism, hypertension, persistent atrial fibrillation, stage IIIa CKD, CVA, type 2 diabetes, who presents to the ED with complaints of lethargy.  History obtained through chart review as patient is currently nonverbal and no family at bedside.     Per chart review, patient currently resides at [facility].  She was diagnosed with an upper respiratory infection approximately 4 days ago and was started on doxycycline.  CT of Chest:  Volume loss right thorax with postsurgical changes  compatible with previous right lobectomies. Suspected fluid and or  debris within distal right bronchi. Near complete consolidation of  the residual right lung with areas of central hypodensity,  potentially due to areas of necrosis. Large multi loculated right  pleural effusion. No internal gas collections. Some areas of  increased internal density within the fluid at the right lung base,  consistent with non simple fluid. Left lung demonstrates mild mosaic  density potentially due to small airways disease. Clumped appearing  consolidation at the left apical lung.      SLP Plan  Continue with current plan of care (for education as needed)      Recommendations for follow up therapy are one component of a multi-disciplinary discharge planning process, led by the attending physician.  Recommendations may be updated based on patient status, additional functional criteria and insurance authorization.    Recommendations  Diet recommendations: Dysphagia 1 (puree);Thin liquid Liquids provided via: Cup;Straw (monitor - pinch straw) Medication Administration: Crushed with puree Supervision: Staff to assist with self feeding;Full supervision/cueing for compensatory strategies Compensations: Minimize environmental distractions;Slow rate;Small sips/bites;Lingual  sweep for clearance of pocketing;Multiple dry swallows after each bite/sip;Follow solids with liquid (REST BREAKS) Postural Changes and/or Swallow Maneuvers: Out of bed for meals;Seated upright 90 degrees;Upright 30-60 min after meal                General recommendations:  (Palliative Care consult and f/u for [place]) Oral Care Recommendations: Oral care BID;Oral care before and after PO;Staff/trained caregiver to provide oral care Follow Up Recommendations: Follow physician's recommendations for discharge plan and follow up therapies Assistance recommended at discharge: Frequent or constant Supervision/Assistance (baseline Dementia) SLP Visit Diagnosis: Dysphagia, oropharyngeal phase (R13.12) (generalized weakness, declined Pulmonary status impacting; Dementia baseline) Plan: Continue with current plan of care (for education as needed)            Orinda Kenner, [place], [place]; [place] [phone] (ascom)  Liston Thum  09/17/2022, 3:40 PM

## 2022-09-17 NOTE — Progress Notes (Signed)
       CROSS COVER NOTE  NAME: Michelle Vang MRN: [id number] DOB : [birth year]    HPI/Events of Note   Informed by RT patient had increased work of breathing and wheezing  Assessment and  Interventions   Assessment: Daughter at bedside explains patient seemed to have more breathing difficulty after she ate this afternoon  Exam Alert Use of abdominal accessory muscle Expiratory wheeze upper airway other wise diminished Chest xray IMPRESSION: Complete opacification of the right hemithorax consistent with enlarging effusion and consolidation when compared with the recent exam. Plan: BIPAP Low dose morphine for comfort       Kathlene Cote NP [facility]

## 2022-09-17 NOTE — TOC Initial Note (Signed)
Transition of Care [facility]) - Initial/Assessment Note    Patient Details  Name: Michelle Vang MRN: [id number] Date of Birth: Oct 07, 1928  [facility]) CM/SW Contact:    Tiburcio Bash, LCSW Phone Number: 09/17/2022, 9:53 AM  Clinical Narrative:                  CSW notes patient is from [facility], per [place] with admissions at [facility] patient is a long term resident there.   TOC will follow for patient's medical readiness to return to [facility] at [employer].   Expected Discharge Plan: Skilled Nursing Facility Barriers to Discharge: Continued Medical Work up   Patient Goals and CMS Choice Patient states their goals for this hospitalization and ongoing recovery are:: to return to [facility] long term CMS Medicare.gov Compare Post Acute Care list provided to:: Patient Choice offered to / list presented to : Patient      Expected Discharge Plan and Services       Living arrangements for the past 2 months: [place] [facility])                                      Prior Living Arrangements/Services Living arrangements for the past 2 months: [place] (Radiation protection practitioner) Lives with:: Facility Resident                   Activities of Daily Living Home Assistive Devices/Equipment: Environmental consultant (specify type) ADL Screening (condition at time of admission) Patient's cognitive ability adequate to safely complete daily activities?: No Is the patient deaf or have difficulty hearing?: No Does the patient have difficulty seeing, even when wearing glasses/contacts?: No Does the patient have difficulty concentrating, remembering, or making decisions?: Yes Patient able to express need for assistance with ADLs?: No Does the patient have difficulty dressing or bathing?: Yes Independently performs ADLs?: No Communication: Independent Dressing (OT): Dependent Is this a change from baseline?: Pre-admission  baseline Grooming: Dependent Is this a change from baseline?: Pre-admission baseline Feeding: Needs assistance Is this a change from baseline?: Pre-admission baseline Bathing: Dependent Is this a change from baseline?: Pre-admission baseline Toileting: Dependent Is this a change from baseline?: Pre-admission baseline In/Out Bed: Dependent Is this a change from baseline?: Pre-admission baseline Walks in Home: Dependent Is this a change from baseline?: Pre-admission baseline Does the patient have difficulty walking or climbing stairs?: Yes Weakness of Legs: Both Weakness of Arms/Hands: Both  Permission Sought/Granted                  Emotional Assessment              Admission diagnosis:  Pleural effusion [J90] CAP (community acquired pneumonia) [J18.9] Pneumonia of right lung due to infectious organism, unspecified part of lung [J18.9] Patient Active Problem List   Diagnosis Date Noted   CAP (community acquired pneumonia) 09/14/2022   Dementia without behavioral disturbance ([place]) 09/14/2022   Acute encephalopathy 09/14/2022   Class 2 severe obesity due to excess calories with serious comorbidity and body mass index (BMI) of 35.0 to 35.9 in adult ([place]) 03/23/2021   CKD (chronic kidney disease), stage III ([place]) 03/22/2021   History of CVA (cerebrovascular accident) 02/09/2021   Persistent atrial fibrillation ([place]) 02/07/2021   HLD (hyperlipidemia) 08/16/2020   Mild cognitive impairment with memory loss 03/28/2019   Insulin dependent type 2 diabetes mellitus ([place]) 12/22/2017  Hypothyroidism 10/02/2015   GERD 01/11/2009   Hypertension associated with diabetes ([place]) 12/12/2008   PCP:  Orvis Brill, [place]:   [facility] - [place], [place] - [address] [place] [address] Phone: [phone] Fax: [phone]     Social Determinants of Health (SDOH) Social History: [place]: No Food Insecurity (09/15/2022)  Housing: Low Risk  (09/15/2022)  Transportation Needs: No Transportation Needs (09/15/2022)  Utilities: Not At Risk (09/15/2022)  Alcohol Screen: Low Risk  (02/20/2021)  Depression (PHQ2-9): Low Risk  (05/10/2020)  Tobacco Use: Low Risk  (09/15/2022)   SDOH Interventions:     Readmission Risk Interventions     No data to display

## 2022-09-17 NOTE — Progress Notes (Signed)
Progress Note    Michelle Vang  ZOX:[id number] DOB: July 03, 1929  DOA: 09/14/2022 PCP: [facility]      Brief Narrative:    Medical records reviewed and are as summarized below:  Michelle Vang is a 89 y.o. female with medical history significant for dementia, hypothyroidism, hypertension, persistent atrial fibrillation, stage IIIa CKD, CVA, type 2 diabetes, respiratory tract infection and started on doxycycline 4 days prior to admission.  She was brought from [facility] home because of lethargy/altered mental status.     Assessment/Plan:   Principal Problem:   CAP (community acquired pneumonia) Active Problems:   Acute encephalopathy   Persistent atrial fibrillation (HCC)   Insulin dependent type 2 diabetes mellitus (HCC)   CKD (chronic kidney disease), stage III (HCC)    Body mass index is 34.5 kg/m. (Obesity)   Severe sepsis secondary to right-sided pneumonia with complete opacification/consolidation with possible pleural effusion/empyema, leukocytosis: Continue empiric IV antibiotics.  Patient's family declined chest tube placement.   AKI on CKD stage IIIa, hypernatremia: Slowly improving.  Continue IV half-normal saline.   Type II DM with hyperglycemia: Continue insulin glargine and NovoLog.  Decrease IV steroids  Acute on chronic hypoxic respiratory failure: Uses 2 L/min oxygen at baseline.  She is requiring 3 L/min oxygen.   Acute metabolic encephalopathy on underlying dementia: Continue supportive care  Dysphagia: Speech therapist recommended dysphagia 1 diet   Persistent atrial fibrillation with RVR: Continue metoprolol   Remote history of lung cancer s/p lobectomy   Consult palliative care team  Diet Order             DIET - DYS 1 Room service appropriate? Yes with Assist; Fluid consistency: Thin  Diet effective now                             Consultants: None  Procedures: None    Medications:    apixaban  5 mg Oral BID   divalproex  500 mg Oral QHS   insulin aspart  0-15 Units Subcutaneous TID WC   insulin glargine-yfgn  6 Units Subcutaneous QHS   levothyroxine  75 mcg Oral Q0600   memantine  5 mg Oral BID   methylPREDNISolone (SOLU-MEDROL) injection  40 mg Intravenous Daily   metoprolol succinate  12.5 mg Oral Daily   sertraline  25 mg Oral Daily   Continuous Infusions:  sodium chloride 50 mL/hr at 09/17/22 1155   cefTRIAXone (ROCEPHIN)  IV Stopped (09/17/22 8119)   metronidazole Stopped (09/17/22 1123)     Anti-infectives (From admission, onward)    Start     Dose/Rate Route Frequency Ordered Stop   09/16/22 0500  vancomycin (VANCOREADY) IVPB 1250 mg/250 mL  Status:  Discontinued        1,250 mg 166.7 mL/hr over 90 Minutes Intravenous Every 36 hours 09/14/22 2053 09/15/22 0742   09/14/22 2200  metroNIDAZOLE (FLAGYL) IVPB 500 mg        500 mg 100 mL/hr over 60 Minutes Intravenous Every 12 hours 09/14/22 1945     09/14/22 2000  cefTRIAXone (ROCEPHIN) 2 g in sodium chloride 0.9 % 100 mL IVPB        2 g 200 mL/hr over 30 Minutes Intravenous Every 24 hours 09/14/22 1945 09/19/22 1944   09/14/22 1600  vancomycin (VANCOCIN) IVPB 1000 mg/200 mL premix       See Hyperspace for full Linked Orders Report.  1,000 mg 200 mL/hr over 60 Minutes Intravenous  Once 09/14/22 1553 09/14/22 1848   09/14/22 1600  vancomycin (VANCOCIN) IVPB 1000 mg/200 mL premix       See Hyperspace for full Linked Orders Report.   1,000 mg 200 mL/hr over 60 Minutes Intravenous  Once 09/14/22 1553 09/14/22 1745   09/14/22 1545  ceFEPIme (MAXIPIME) 2 g in sodium chloride 0.9 % 100 mL IVPB        2 g 200 mL/hr over 30 Minutes Intravenous  Once 09/14/22 1541 09/14/22 1627   09/14/22 1545  metroNIDAZOLE (FLAGYL) IVPB 500 mg        500 mg 100 mL/hr over 60 Minutes Intravenous  Once 09/14/22 1541 09/14/22 1744    09/14/22 1545  vancomycin (VANCOCIN) IVPB 1000 mg/200 mL premix  Status:  Discontinued        1,000 mg 200 mL/hr over 60 Minutes Intravenous  Once 09/14/22 1541 09/14/22 1553              Family Communication/Anticipated D/C date and plan/Code Status   DVT prophylaxis:  apixaban (ELIQUIS) tablet 5 mg     Code Status: DNR  Family Communication: [place], daughter, at the bedside Disposition Plan: Plan to discharge to SNF when medically stable   Status is: Inpatient Remains inpatient appropriate because: Hypoxia, change in mental status, on IV antibiotics       Subjective:   Interval events noted.  She is confused and unable to provide any history.  Sunday, daughter, was at the bedside  Objective:    Vitals:   09/17/22 1100 09/17/22 1108 09/17/22 1200 09/17/22 1300  BP:  (!) 140/76    Pulse:  (!) 105    Resp: (!) 21 20 19 17   Temp:  97.7 F (36.5 C)    TempSrc:  Oral    SpO2:  98%    Weight:      Height:       No data found.   Intake/Output Summary (Last 24 hours) at 09/17/2022 1433 Last data filed at 09/17/2022 1416 Gross per 24 hour  Intake 2118.24 ml  Output 750 ml  Net 1368.24 ml   Filed Weights   09/14/22 1402  Weight: 91.2 kg    Exam:  GEN: NAD SKIN: Warm and dry EYES: EOMI ENT: MMM CV: RRR PULM: Coarse breath sounds, mild bilateral wheezing ABD: soft, ND, NT, +BS CNS: AAO x 3, non focal EXT: No edema or tenderness        Data Reviewed:   I have personally reviewed following labs and imaging studies:  Labs: Labs show the following:   Basic Metabolic Panel: Recent Labs  Lab 09/14/22 1404 09/15/22 0607 09/17/22 0552  NA 148* 148* 146*  K 4.0 4.2 3.9  CL 117* 113* 114*  CO2 25 26 20*  GLUCOSE 329* 229* 428*  BUN 46* 44* 56*  CREATININE 1.26* 1.02* 1.13*  CALCIUM 7.8* 8.3* 8.3*   GFR Estimated Creatinine Clearance: 35.5 mL/min (A) (by C-G formula based on SCr of 1.13 mg/dL (H)). Liver Function Tests: Recent Labs   Lab 09/14/22 1404 09/15/22 0607  AST 60* 51*  ALT 45* 44  ALKPHOS 144* 141*  BILITOT 0.7 1.0  PROT 5.9* 6.6  ALBUMIN 1.8* 1.9*   No results for input(s): "LIPASE", "AMYLASE" in the last 168 hours. No results for input(s): "AMMONIA" in the last 168 hours. Coagulation profile Recent Labs  Lab 09/14/22 1404  INR 2.5*    CBC: Recent Labs  Lab  09/14/22 1404 09/15/22 0607 09/17/22 0552  WBC 23.5* 28.6* 24.1*  NEUTROABS 18.0* 22.6*  --   HGB 10.8* 11.6* 10.5*  HCT 34.2* 37.4 32.6*  MCV 96.6 97.9 94.8  PLT 276 346 376   Cardiac Enzymes: No results for input(s): "CKTOTAL", "CKMB", "CKMBINDEX", "TROPONINI" in the last 168 hours. BNP (last 3 results) No results for input(s): "PROBNP" in the last 8760 hours. CBG: Recent Labs  Lab 09/16/22 1231 09/16/22 2200 09/17/22 0748 09/17/22 0749 09/17/22 1104  GLUCAP 193* 381* 418* 377* 333*   D-Dimer: No results for input(s): "DDIMER" in the last 72 hours. Hgb A1c: No results for input(s): "HGBA1C" in the last 72 hours. Lipid Profile: No results for input(s): "CHOL", "HDL", "LDLCALC", "TRIG", "CHOLHDL", "LDLDIRECT" in the last 72 hours. Thyroid function studies: No results for input(s): "TSH", "T4TOTAL", "T3FREE", "THYROIDAB" in the last 72 hours.  Invalid input(s): "FREET3" Anemia work up: No results for input(s): "VITAMINB12", "FOLATE", "FERRITIN", "TIBC", "IRON", "RETICCTPCT" in the last 72 hours. Sepsis Labs: Recent Labs  Lab 09/14/22 1404 09/14/22 1543 09/15/22 0607 09/17/22 0552  WBC 23.5*  --  28.6* 24.1*  LATICACIDVEN 3.0* 2.8*  --   --     Microbiology Recent Results (from the past 240 hour(s))  Culture, blood (Routine x 2)     Status: None (Preliminary result)   Collection Time: 09/14/22  2:04 PM   Specimen: BLOOD  Result Value Ref Range Status   Specimen Description BLOOD LEFT ANTECUBITAL  Final   Special Requests   Final    BOTTLES DRAWN AEROBIC AND ANAEROBIC Blood Culture results may not be  optimal due to an excessive volume of blood received in culture bottles   Culture   Final    NO GROWTH 3 DAYS Performed at [facility], [address], [place], [place] [address]    Report Status PENDING  Incomplete  Resp panel by RT-PCR (RSV, Flu A&B, Covid) Anterior Nasal Swab     Status: None   Collection Time: 09/14/22  2:09 PM   Specimen: Anterior Nasal Swab  Result Value Ref Range Status   SARS Coronavirus 2 by RT PCR NEGATIVE NEGATIVE Final    Comment: (NOTE) SARS-CoV-2 target nucleic acids are NOT DETECTED.  The SARS-CoV-2 RNA is generally detectable in upper respiratory specimens during the acute phase of infection. The lowest concentration of SARS-CoV-2 viral copies this assay can detect is 138 copies/mL. A negative result does not preclude SARS-Cov-2 infection and should not be used as the sole basis for treatment or other patient management decisions. A negative result may occur with  improper specimen collection/handling, submission of specimen other than nasopharyngeal swab, presence of viral mutation(s) within the areas targeted by this assay, and inadequate number of viral copies(<138 copies/mL). A negative result must be combined with clinical observations, patient history, and epidemiological information. The expected result is Negative.  Fact Sheet for Patients:  EntrepreneurPulse.com.au  Fact Sheet for Healthcare Providers:  IncredibleEmployment.be  This test is no t yet approved or cleared by the [place] FDA and  has been authorized for detection and/or diagnosis of SARS-CoV-2 by FDA under an Emergency Use Authorization (EUA). This EUA will remain  in effect (meaning this test can be used) for the duration of the COVID-19 declaration under Section 564(b)(1) of the Act, 21 U.S.C.section 360bbb-3(b)(1), unless the authorization is terminated  or revoked sooner.       Influenza A by PCR NEGATIVE NEGATIVE  Final   Influenza B by PCR NEGATIVE  NEGATIVE Final    Comment: (NOTE) The Xpert Xpress SARS-CoV-2/FLU/RSV plus assay is intended as an aid in the diagnosis of influenza from Nasopharyngeal swab specimens and should not be used as a sole basis for treatment. Nasal washings and aspirates are unacceptable for Xpert Xpress SARS-CoV-2/FLU/RSV testing.  Fact Sheet for Patients: EntrepreneurPulse.com.au  Fact Sheet for Healthcare Providers: IncredibleEmployment.be  This test is not yet approved or cleared by the [place] FDA and has been authorized for detection and/or diagnosis of SARS-CoV-2 by FDA under an Emergency Use Authorization (EUA). This EUA will remain in effect (meaning this test can be used) for the duration of the COVID-19 declaration under Section 564(b)(1) of the Act, 21 U.S.C. section 360bbb-3(b)(1), unless the authorization is terminated or revoked.     Resp Syncytial Virus by PCR NEGATIVE NEGATIVE Final    Comment: (NOTE) Fact Sheet for Patients: EntrepreneurPulse.com.au  Fact Sheet for Healthcare Providers: IncredibleEmployment.be  This test is not yet approved or cleared by the [place] FDA and has been authorized for detection and/or diagnosis of SARS-CoV-2 by FDA under an Emergency Use Authorization (EUA). This EUA will remain in effect (meaning this test can be used) for the duration of the COVID-19 declaration under Section 564(b)(1) of the Act, 21 U.S.C. section 360bbb-3(b)(1), unless the authorization is terminated or revoked.  Performed at [facility], [address] [place], [place] [address]   Culture, blood (Routine x 2)     Status: None (Preliminary result)   Collection Time: 09/14/22  3:43 PM   Specimen: BLOOD  Result Value Ref Range Status   Specimen Description BLOOD BLOOD RIGHT HAND  Final   Special Requests   Final    BOTTLES DRAWN AEROBIC AND  ANAEROBIC Blood Culture adequate volume   Culture   Final    NO GROWTH 3 DAYS Performed at [facility], [address], [place], [place] [address]    Report Status PENDING  Incomplete  MRSA Next Gen by PCR, Nasal     Status: None   Collection Time: 09/14/22 11:08 PM   Specimen: Nasal Mucosa; Nasal Swab  Result Value Ref Range Status   MRSA by PCR Next Gen NOT DETECTED NOT DETECTED Final    Comment: (NOTE) The GeneXpert MRSA Assay (FDA approved for NASAL specimens only), is one component of a comprehensive MRSA colonization surveillance program. It is not intended to diagnose MRSA infection nor to guide or monitor treatment for MRSA infections. Test performance is not FDA approved in patients less than 70 years old. Performed at [facility], [address] [place], [place] [address]     Procedures and diagnostic studies:  No results found.             LOS: 3 days   Rhapsody Wolven  [facility]   Pager on [email or web address]. If 7PM-7AM, please contact night-coverage at [email or web address]     09/17/2022, 2:33 PM

## 2022-09-18 DIAGNOSIS — J189 Pneumonia, unspecified organism: Secondary | ICD-10-CM | POA: Diagnosis not present

## 2022-09-18 DIAGNOSIS — Z7189 Other specified counseling: Secondary | ICD-10-CM

## 2022-09-18 DIAGNOSIS — E870 Hyperosmolality and hypernatremia: Secondary | ICD-10-CM

## 2022-09-18 DIAGNOSIS — J918 Pleural effusion in other conditions classified elsewhere: Secondary | ICD-10-CM | POA: Diagnosis not present

## 2022-09-18 DIAGNOSIS — G9340 Encephalopathy, unspecified: Secondary | ICD-10-CM | POA: Diagnosis not present

## 2022-09-18 LAB — BASIC METABOLIC PANEL
Anion gap: 10 (ref 5–15)
BUN: 55 mg/dL — ABNORMAL HIGH (ref 8–23)
CO2: 22 mmol/L (ref 22–32)
Calcium: 8.2 mg/dL — ABNORMAL LOW (ref 8.9–10.3)
Chloride: 116 mmol/L — ABNORMAL HIGH (ref 98–111)
Creatinine, Ser: 1.03 mg/dL — ABNORMAL HIGH (ref 0.44–1.00)
GFR, Estimated: 51 mL/min — ABNORMAL LOW (ref >60)
Glucose, Bld: 321 mg/dL — ABNORMAL HIGH (ref 70–99)
Potassium: 3.8 mmol/L (ref 3.5–5.1)
Sodium: 148 mmol/L — ABNORMAL HIGH (ref 135–145)

## 2022-09-18 LAB — CBC WITH DIFFERENTIAL/PLATELET
Abs Immature Granulocytes: 0.52 10*3/uL — ABNORMAL HIGH (ref 0.00–0.07)
Basophils Absolute: 0.1 10*3/uL (ref 0.0–0.1)
Basophils Relative: 0 %
Eosinophils Absolute: 0 10*3/uL (ref 0.0–0.5)
Eosinophils Relative: 0 %
HCT: 32.4 % — ABNORMAL LOW (ref 36.0–46.0)
Hemoglobin: 10.4 g/dL — ABNORMAL LOW (ref 12.0–15.0)
Immature Granulocytes: 2 %
Lymphocytes Relative: 4 %
Lymphs Abs: 0.9 10*3/uL (ref 0.7–4.0)
MCH: 30.5 pg (ref 26.0–34.0)
MCHC: 32.1 g/dL (ref 30.0–36.0)
MCV: 95 fL (ref 80.0–100.0)
Monocytes Absolute: 2.2 10*3/uL — ABNORMAL HIGH (ref 0.1–1.0)
Monocytes Relative: 10 %
Neutro Abs: 18.9 10*3/uL — ABNORMAL HIGH (ref 1.7–7.7)
Neutrophils Relative %: 84 %
Platelets: 406 10*3/uL — ABNORMAL HIGH (ref 150–400)
RBC: 3.41 MIL/uL — ABNORMAL LOW (ref 3.87–5.11)
RDW: 13.8 % (ref 11.5–15.5)
WBC: 22.6 10*3/uL — ABNORMAL HIGH (ref 4.0–10.5)
nRBC: 0 % (ref 0.0–0.2)

## 2022-09-18 LAB — GLUCOSE, CAPILLARY
Glucose-Capillary: 265 mg/dL — ABNORMAL HIGH (ref 70–99)
Glucose-Capillary: 269 mg/dL — ABNORMAL HIGH (ref 70–99)
Glucose-Capillary: 212 mg/dL — ABNORMAL HIGH (ref 70–99)
Glucose-Capillary: 171 mg/dL — ABNORMAL HIGH (ref 70–99)

## 2022-09-18 LAB — PHOSPHORUS: Phosphorus: 3.6 mg/dL (ref 2.5–4.6)

## 2022-09-18 LAB — MAGNESIUM: Magnesium: 1.9 mg/dL (ref 1.7–2.4)

## 2022-09-18 MED ORDER — METOPROLOL SUCCINATE ER 25 MG PO TB24
25.0000 mg | ORAL_TABLET | Freq: Every day | ORAL | Status: DC
  Administered 2022-09-19: 25 mg via ORAL
  Filled 2022-09-18: qty 1

## 2022-09-18 MED ORDER — METOPROLOL SUCCINATE ER 25 MG PO TB24
12.5000 mg | ORAL_TABLET | Freq: Once | ORAL | Status: AC
  Administered 2022-09-18: 12.5 mg via ORAL
  Filled 2022-09-18: qty 1

## 2022-09-18 MED ORDER — MORPHINE SULFATE (PF) 2 MG/ML IV SOLN: 1.0000 mg | Freq: Three times a day (TID) | INTRAVENOUS | Status: AC | PRN

## 2022-09-18 NOTE — Progress Notes (Signed)
Speech Language Pathology Treatment: Dysphagia  Patient Details Name: Michelle Vang MRN: [id number] DOB: 06-Jun-1929 Today's Date: 09/18/2022 Time: 7858-8502 SLP Time Calculation (min) (ACUTE ONLY): 60 min  Assessment / Plan / Recommendation Clinical Impression  Pt seen for ongoing assessment of swallowing and toleration of diet. Pt awake, smiled at times and nodded but did not initiate conversation. She was easily distracted and impulsive, reaching for Cup/straw to drink. Dementia baseline.  OF NOTE: noted expiratory wheeze w/ breathing; min WOB at rest and during po intake. This presentation was ALSO NOTED at evaluation both PRIOR TO and DURING po intake then, AND at admit, per chart notes. Rest Breaks were given, and recommended, w/ all oral intake for conservation of energy as well as the PUREED diet. On [place] O2 support 3L; afebrile. WBC elevated.     Pt presents w/ grossly functional oropharyngeal phase swallowing w/ No immediate, overt s/s of aspiration, coughing. HOWEVER, pt's Pulmonary status is declined at Baseline (prior to po's) w/ increased WOB w/ any exertion including sitting up in bed, taking po's -- this can increase risk for aspiration. ANY Pulmonary decline can impact Apnea timing and airway closure during the swallow which can impact pharyngeal swallowing, airway protection, and increase risk for aspiration to occur thus further Pulmonary impact/decline. Delayed coughing/throat clearing was noted x3 w/ trials of thin liquids; not noted w/ trials of puree. Pinched straw strategy utilized to reduce large sips and the risk for aspiration as much as possible. Rest Breaks were strongly recommended and given to aid in calming the increase WOB demand and to support safer swallowing at this meal.     During oral intake, pt consumed sips of thin liquids Via Straw(PINCHED straw to limit bolus size and Impulsive drinking), and tsps of purees of meal. Pt appeared to have min increased WOB --  suspect potential fatigue w/ eating/drinking at the time. REST BREAKS were given b/t trials w/ careful monitoring of pt's RR effort. SMALLER amounts of po's given at one time to lessen fatigue w/ po tasks, for conservation of energy.  No immediate, overt clinical s/s of aspiration noted w/ po's consumed; but delayed congested coughing noted. PINCHED straw and REST BREAK given more consistently as strategies b/t trials. Swallowing of po's was fairly timely. Pt returned to mouth-breathing immediately post swallowing d/t declined respiratory status.    Oral phase appeared grossly [facility] for bolus management of trial consistencies given; oral clearing appropriate given min Time occasionally.    Pt is at increased risk for aspiration/aspiration pneumonia in setting of declined Pulmonary status, illness, advanced age, and Dementia Baseline. She needs MOD-MAX feeding support at meals; Supervision to Pinch straw to take SMALL sips -- she is Impulsive w/ her drinking AND give Rest Breaks b/t bites/sips.  OF NOTE: this was discussed via TC post session w/ Daughter, [name]. Discussion included pt's Increased risk for aspiration at this time d/t declined Pulmonary status -- discussed her/Sister's GOC for their Mother re: oral intake in setting of illness and QOL. [name] stated wanting pt to continue to have po intake w/ understanding of risk for aspiration secondary to her Baseline Pulmonary decline -- as careful as possible.  This was discussed w/ Palliative Care and NSG post TC/session; precautions are posted in room.     Recommend continue a Pureed diet w/ moistened foods(gravies) in setting of Missing Dentition to reduce exertion/demand -- conservation of energy and thins liquids PINCHING straw to limit bolus size; aspiration precautions. Pills given CRUSHED in Puree  to lessen risk for aspiration. Supervision and Frequent REST BREAKS during oral intake w/ close monitoring of RR and increased mouth breathing. NSG support  w/ feeding at meals; positioning support for upright sitting for all oral intake. Stop feeding if any increased coughing noted.    ST services will monitor pt's status while admitted; can be available for further education while admitted if needed. MD/NSG updated on above; Education completed w/ Daughter, [name].          HPI HPI: Pt is a 89 y.o. female with medical history significant of dementia, hypothyroidism, hypertension, persistent atrial fibrillation, stage IIIa CKD, CVA, type 2 diabetes, who presents to the ED with complaints of lethargy.  History obtained through chart review as patient is currently nonverbal and no family at bedside.     Per chart review, patient currently resides at [facility].  She was diagnosed with an upper respiratory infection approximately 4 days ago and was started on doxycycline.  CT of Chest:  Volume loss right thorax with postsurgical changes  compatible with previous right lobectomies. Suspected fluid and or  debris within distal right bronchi. Near complete consolidation of  the residual right lung with areas of central hypodensity,  potentially due to areas of necrosis. Large multi loculated right  pleural effusion. No internal gas collections. Some areas of  increased internal density within the fluid at the right lung base,  consistent with non simple fluid. Left lung demonstrates mild mosaic  density potentially due to small airways disease. Clumped appearing  consolidation at the left apical lung.      SLP Plan  Continue with current plan of care (education; monitoring of status)      Recommendations for follow up therapy are one component of a multi-disciplinary discharge planning process, led by the attending physician.  Recommendations may be updated based on patient status, additional functional criteria and insurance authorization.    Recommendations  Diet recommendations: Dysphagia 1 (puree);Thin liquid Liquids provided via: Cup;Straw  (pinch straw) Medication Administration: Crushed with puree Supervision: Staff to assist with self feeding;Full supervision/cueing for compensatory strategies Compensations: Minimize environmental distractions;Slow rate;Small sips/bites;Lingual sweep for clearance of pocketing;Multiple dry swallows after each bite/sip;Follow solids with liquid Postural Changes and/or Swallow Maneuvers: Out of bed for meals;Seated upright 90 degrees;Upright 30-60 min after meal                General recommendations:  (Palliative Care f/u for [place] discussion) Oral Care Recommendations: Oral care BID;Oral care before and after PO;Staff/trained caregiver to provide oral care Follow Up Recommendations: Follow physician's recommendations for discharge plan and follow up therapies (TBD) Assistance recommended at discharge: Frequent or constant Supervision/Assistance (baseline Dementia; pulmonary decline) SLP Visit Diagnosis: Dysphagia, oropharyngeal phase (R13.12) (generalized weakness, declined Pulmonary status impacting; Dementia baseline) Plan: Continue with current plan of care (education; monitoring of status)             Orinda Kenner, MS, CCC-SLP Speech Language Pathologist Rehab Services; [place] [phone] (ascom) Hollace Michelli  09/18/2022, 4:47 PM

## 2022-09-18 NOTE — Progress Notes (Addendum)
Progress Note    Michelle Vang  RJJ:[id number] DOB: 08/13/1929  DOA: 09/14/2022 PCP: [facility]      Brief Narrative:    Medical records reviewed and are as summarized below:  Michelle Vang is a 89 y.o. female with medical history significant for dementia, hypothyroidism, hypertension, persistent atrial fibrillation, stage IIIa CKD, CVA, type 2 diabetes, respiratory tract infection and started on doxycycline 4 days prior to admission.  She was brought from [facility] home because of lethargy/altered mental status.     Assessment/Plan:   Principal Problem:   CAP (community acquired pneumonia) Active Problems:   Acute encephalopathy   Persistent atrial fibrillation (HCC)   Insulin dependent type 2 diabetes mellitus (HCC)   CKD (chronic kidney disease), stage III (HCC)   History of CVA (cerebrovascular accident)   Dementia without behavioral disturbance (HCC)   Hypernatremia    Body mass index is 34.5 kg/m. (Obesity)   Severe sepsis secondary to right-sided pneumonia with complete opacification/consolidation with possible pleural effusion/empyema, leukocytosis: Continue empiric IV ceftriaxone and Flagyl.  Patient's family declined chest tube placement.   AKI on CKD stage IIIa, hypernatremia: Creatinine appears to be at baseline.  However, sodium level has not gotten any better.  Sodium went up from 146 to  148.  Type II DM with hyperglycemia: Discontinue IV Solu-Medrol.  Continue insulin glargine and NovoLog.  Reluctant to increase insulin for now because of poor oral intake.  Will consider increasing insulin if she remains hyperglycemic after discontinuation of steroids.    Acute on chronic hypoxic respiratory failure: Now requiring BiPAP.  ABG was unremarkable.   Acute metabolic encephalopathy on underlying dementia: Continue supportive care  Dysphagia: Speech therapist recommended dysphagia 1 diet   Persistent atrial  fibrillation with RVR: Increase metoprolol from 12.5 to 25 mg daily for adequate rate control.  Continue Eliquis.   Remote history of lung cancer s/p lobectomy   Plan discussed with [name], daughter, over the phone.  She was informed that her mother was not improving.  She understands that there is no much to offer if her respiratory status does not improve with BiPAP.  She confirmed that her mother is DNI and DNR.  She will talk to her sister and get back to me.  Diet Order             DIET - DYS 1 Room service appropriate? Yes with Assist; Fluid consistency: Thin  Diet effective now                            Consultants: None  Procedures: None    Medications:    apixaban  5 mg Oral BID   divalproex  500 mg Oral QHS   insulin aspart  0-15 Units Subcutaneous TID WC   insulin glargine-yfgn  6 Units Subcutaneous QHS   levothyroxine  75 mcg Oral Q0600   memantine  5 mg Oral BID   [START ON 09/19/2022] metoprolol succinate  25 mg Oral Daily   sertraline  25 mg Oral Daily   Continuous Infusions:  sodium chloride 50 mL/hr at 09/18/22 1423   cefTRIAXone (ROCEPHIN)  IV 2 g (09/17/22 1948)   metronidazole 500 mg (09/18/22 0842)     Anti-infectives (From admission, onward)    Start     Dose/Rate Route Frequency Ordered Stop   09/16/22 0500  vancomycin (VANCOREADY) IVPB 1250 mg/250 mL  Status:  Discontinued  1,250 mg 166.7 mL/hr over 90 Minutes Intravenous Every 36 hours 09/14/22 2053 09/15/22 0742   09/14/22 2200  metroNIDAZOLE (FLAGYL) IVPB 500 mg        500 mg 100 mL/hr over 60 Minutes Intravenous Every 12 hours 09/14/22 1945     09/14/22 2000  cefTRIAXone (ROCEPHIN) 2 g in sodium chloride 0.9 % 100 mL IVPB        2 g 200 mL/hr over 30 Minutes Intravenous Every 24 hours 09/14/22 1945 09/19/22 1944   09/14/22 1600  vancomycin (VANCOCIN) IVPB 1000 mg/200 mL premix       See Hyperspace for full Linked Orders Report.   1,000 mg 200 mL/hr over 60 Minutes  Intravenous  Once 09/14/22 1553 09/14/22 1848   09/14/22 1600  vancomycin (VANCOCIN) IVPB 1000 mg/200 mL premix       See Hyperspace for full Linked Orders Report.   1,000 mg 200 mL/hr over 60 Minutes Intravenous  Once 09/14/22 1553 09/14/22 1745   09/14/22 1545  ceFEPIme (MAXIPIME) 2 g in sodium chloride 0.9 % 100 mL IVPB        2 g 200 mL/hr over 30 Minutes Intravenous  Once 09/14/22 1541 09/14/22 1627   09/14/22 1545  metroNIDAZOLE (FLAGYL) IVPB 500 mg        500 mg 100 mL/hr over 60 Minutes Intravenous  Once 09/14/22 1541 09/14/22 1744   09/14/22 1545  vancomycin (VANCOCIN) IVPB 1000 mg/200 mL premix  Status:  Discontinued        1,000 mg 200 mL/hr over 60 Minutes Intravenous  Once 09/14/22 1541 09/14/22 1553              Family Communication/Anticipated D/C date and plan/Code Status   DVT prophylaxis:  apixaban (ELIQUIS) tablet 5 mg     Code Status: DNR  Family Communication: [place], daughter, over the phone Disposition Plan: Plan to discharge to SNF when medically stable   Status is: Inpatient Remains inpatient appropriate because: Hypoxia, change in mental status, on IV antibiotics       Subjective:   Interval events noted.  She is nonverbal and confused and unable to provide any history.  Objective:    Vitals:   09/18/22 0032 09/18/22 0442 09/18/22 0737 09/18/22 1201  BP: 126/83 134/85 129/74 (!) 136/93  Pulse: (!) 121 (!) 112 (!) 107 (!) 105  Resp: (!) 22 18 15 17   Temp: 97.7 F (36.5 C) (!) 97.5 F (36.4 C) 97.9 F (36.6 C) (!) 97.2 F (36.2 C)  TempSrc: Axillary Axillary Oral Oral  SpO2: 97% 98% 98% 100%  Weight:      Height:       No data found.   Intake/Output Summary (Last 24 hours) at 09/18/2022 1434 Last data filed at 09/18/2022 0830 Gross per 24 hour  Intake 254.13 ml  Output 600 ml  Net -345.87 ml   Filed Weights   09/14/22 1402  Weight: 91.2 kg    Exam:  GEN: NAD SKIN: Warm and dry EYES: EOMI ENT: MMM CV: Irregular  rate and rhythm, tachycardic PULM: On BiPAP, coarse breath sounds, bilateral wheezing at the bases ABD: soft, ND, NT, +BS CNS: Alert but nonverbal at this time, she does not follow commands but she moves all extremities spontaneously EXT: No edema or tenderness        Data Reviewed:   I have personally reviewed following labs and imaging studies:  Labs: Labs show the following:   Basic Metabolic Panel: Recent Labs  Lab  09/14/22 1404 09/15/22 0607 09/17/22 0552 09/18/22 0538  NA 148* 148* 146* 148*  K 4.0 4.2 3.9 3.8  CL 117* 113* 114* 116*  CO2 25 26 20* 22  GLUCOSE 329* 229* 428* 321*  BUN 46* 44* 56* 55*  CREATININE 1.26* 1.02* 1.13* 1.03*  CALCIUM 7.8* 8.3* 8.3* 8.2*  MG  --   --   --  1.9  PHOS  --   --   --  3.6   GFR Estimated Creatinine Clearance: 38.9 mL/min (A) (by C-G formula based on SCr of 1.03 mg/dL (H)). Liver Function Tests: Recent Labs  Lab 09/14/22 1404 09/15/22 0607  AST 60* 51*  ALT 45* 44  ALKPHOS 144* 141*  BILITOT 0.7 1.0  PROT 5.9* 6.6  ALBUMIN 1.8* 1.9*   No results for input(s): "LIPASE", "AMYLASE" in the last 168 hours. No results for input(s): "AMMONIA" in the last 168 hours. Coagulation profile Recent Labs  Lab 09/14/22 1404  INR 2.5*    CBC: Recent Labs  Lab 09/14/22 1404 09/15/22 0607 09/17/22 0552 09/18/22 0538  WBC 23.5* 28.6* 24.1* 22.6*  NEUTROABS 18.0* 22.6*  --  18.9*  HGB 10.8* 11.6* 10.5* 10.4*  HCT 34.2* 37.4 32.6* 32.4*  MCV 96.6 97.9 94.8 95.0  PLT 276 346 376 406*   Cardiac Enzymes: No results for input(s): "CKTOTAL", "CKMB", "CKMBINDEX", "TROPONINI" in the last 168 hours. BNP (last 3 results) No results for input(s): "PROBNP" in the last 8760 hours. CBG: Recent Labs  Lab 09/17/22 1104 09/17/22 1613 09/17/22 2224 09/18/22 0736 09/18/22 1210  GLUCAP 333* 357* 287* 265* 269*   D-Dimer: No results for input(s): "DDIMER" in the last 72 hours. Hgb A1c: No results for input(s): "HGBA1C" in  the last 72 hours. Lipid Profile: No results for input(s): "CHOL", "HDL", "LDLCALC", "TRIG", "CHOLHDL", "LDLDIRECT" in the last 72 hours. Thyroid function studies: No results for input(s): "TSH", "T4TOTAL", "T3FREE", "THYROIDAB" in the last 72 hours.  Invalid input(s): "FREET3" Anemia work up: No results for input(s): "VITAMINB12", "FOLATE", "FERRITIN", "TIBC", "IRON", "RETICCTPCT" in the last 72 hours. Sepsis Labs: Recent Labs  Lab 09/14/22 1404 09/14/22 1543 09/15/22 0607 09/17/22 0552 09/18/22 0538  WBC 23.5*  --  28.6* 24.1* 22.6*  LATICACIDVEN 3.0* 2.8*  --   --   --     Microbiology Recent Results (from the past 240 hour(s))  Culture, blood (Routine x 2)     Status: None (Preliminary result)   Collection Time: 09/14/22  2:04 PM   Specimen: BLOOD  Result Value Ref Range Status   Specimen Description BLOOD LEFT ANTECUBITAL  Final   Special Requests   Final    BOTTLES DRAWN AEROBIC AND ANAEROBIC Blood Culture results may not be optimal due to an excessive volume of blood received in culture bottles   Culture   Final    NO GROWTH 4 DAYS Performed at [facility], [address] [place], [place] [address]    Report Status PENDING  Incomplete  Resp panel by RT-PCR (RSV, Flu A&B, Covid) Anterior Nasal Swab     Status: None   Collection Time: 09/14/22  2:09 PM   Specimen: Anterior Nasal Swab  Result Value Ref Range Status   SARS Coronavirus 2 by RT PCR NEGATIVE NEGATIVE Final    Comment: (NOTE) SARS-CoV-2 target nucleic acids are NOT DETECTED.  The SARS-CoV-2 RNA is generally detectable in upper respiratory specimens during the acute phase of infection. The lowest concentration of SARS-CoV-2 viral copies this assay can  detect is 138 copies/mL. A negative result does not preclude SARS-Cov-2 infection and should not be used as the sole basis for treatment or other patient management decisions. A negative result may occur with  improper specimen  collection/handling, submission of specimen other than nasopharyngeal swab, presence of viral mutation(s) within the areas targeted by this assay, and inadequate number of viral copies(<138 copies/mL). A negative result must be combined with clinical observations, patient history, and epidemiological information. The expected result is Negative.  Fact Sheet for Patients:  EntrepreneurPulse.com.au  Fact Sheet for Healthcare Providers:  IncredibleEmployment.be  This test is no t yet approved or cleared by the [place] FDA and  has been authorized for detection and/or diagnosis of SARS-CoV-2 by FDA under an Emergency Use Authorization (EUA). This EUA will remain  in effect (meaning this test can be used) for the duration of the COVID-19 declaration under Section 564(b)(1) of the Act, 21 U.S.C.section 360bbb-3(b)(1), unless the authorization is terminated  or revoked sooner.       Influenza A by PCR NEGATIVE NEGATIVE Final   Influenza B by PCR NEGATIVE NEGATIVE Final    Comment: (NOTE) The Xpert Xpress SARS-CoV-2/FLU/RSV plus assay is intended as an aid in the diagnosis of influenza from Nasopharyngeal swab specimens and should not be used as a sole basis for treatment. Nasal washings and aspirates are unacceptable for Xpert Xpress SARS-CoV-2/FLU/RSV testing.  Fact Sheet for Patients: EntrepreneurPulse.com.au  Fact Sheet for Healthcare Providers: IncredibleEmployment.be  This test is not yet approved or cleared by the [place] FDA and has been authorized for detection and/or diagnosis of SARS-CoV-2 by FDA under an Emergency Use Authorization (EUA). This EUA will remain in effect (meaning this test can be used) for the duration of the COVID-19 declaration under Section 564(b)(1) of the Act, 21 U.S.C. section 360bbb-3(b)(1), unless the authorization is terminated or revoked.     Resp Syncytial  Virus by PCR NEGATIVE NEGATIVE Final    Comment: (NOTE) Fact Sheet for Patients: EntrepreneurPulse.com.au  Fact Sheet for Healthcare Providers: IncredibleEmployment.be  This test is not yet approved or cleared by the [place] FDA and has been authorized for detection and/or diagnosis of SARS-CoV-2 by FDA under an Emergency Use Authorization (EUA). This EUA will remain in effect (meaning this test can be used) for the duration of the COVID-19 declaration under Section 564(b)(1) of the Act, 21 U.S.C. section 360bbb-3(b)(1), unless the authorization is terminated or revoked.  Performed at [facility], [address] [place], [place] [address]   Culture, blood (Routine x 2)     Status: None (Preliminary result)   Collection Time: 09/14/22  3:43 PM   Specimen: BLOOD  Result Value Ref Range Status   Specimen Description BLOOD BLOOD RIGHT HAND  Final   Special Requests   Final    BOTTLES DRAWN AEROBIC AND ANAEROBIC Blood Culture adequate volume   Culture   Final    NO GROWTH 4 DAYS Performed at [facility], [address], [place], [place] [address]    Report Status PENDING  Incomplete  MRSA Next Gen by PCR, Nasal     Status: None   Collection Time: 09/14/22 11:08 PM   Specimen: Nasal Mucosa; Nasal Swab  Result Value Ref Range Status   MRSA by PCR Next Gen NOT DETECTED NOT DETECTED Final    Comment: (NOTE) The GeneXpert MRSA Assay (FDA approved for NASAL specimens only), is one component of a comprehensive MRSA colonization surveillance program. It is not intended  to diagnose MRSA infection nor to guide or monitor treatment for MRSA infections. Test performance is not FDA approved in patients less than 39 years old. Performed at [facility], [address] [place], [place] [address]     Procedures and diagnostic studies:  DG Chest [facility] 1 View  Result Date: 09/17/2022 CLINICAL DATA:  Increasing  shortness of breath EXAM: PORTABLE CHEST 1 VIEW COMPARISON:  09/14/2022 FINDINGS: Cardiac shadow is stable but obscured on the right. Complete opacification of the right hemithorax is now seen consistent with increasing effusion and consolidation. Left lung is clear. No acute bony abnormality is noted. IMPRESSION: Complete opacification of the right hemithorax consistent with enlarging effusion and consolidation when compared with the recent exam. Electronically Signed   By: Inez Catalina M.D.   On: 09/17/2022 21:43               LOS: 4 days   Marlin Jarrard  [facility]   Pager on [email or web address]. If 7PM-7AM, please contact night-coverage at [email or web address]     09/18/2022, 2:34 PM

## 2022-09-18 NOTE — Consult Note (Signed)
Consultation Note Date: 09/18/2022   Patient Name: Michelle Vang  DOB: 05-Jan-1929  MRN: [id number]  Age / Sex: 89 y.o., female  PCP: [facility] Referring Physician: Lurene Shadow, MD  Reason for Consultation: Establishing goals of care  HPI/Patient Profile: Michelle Vang is a 89 y.o. female with medical history significant of dementia, hypothyroidism, hypertension, persistent atrial fibrillation, stage IIIa CKD, CVA, type 2 diabetes, who presents to the ED with complaints of lethargy.  History obtained through chart review as patient is currently nonverbal and no family at bedside.   Per chart review, patient currently resides at [facility].  She was diagnosed with an upper respiratory infection approximately 4 days ago and was started on doxycycline.  Today, she became increasingly lethargic although remained responsive to verbal stimuli.  Due to this, EMS was called.  Clinical Assessment and Goals of Care: Notes and labs reviewed. Updated by SLP regarding status and concerns. In to see patient. She is sitting in bed and looks at me and smiles, but does not speak. Some WOB noted. No family at bedside.   Called to speak with daughter Michelle Vang, she is able to articulate her mother's status. She inquired to clarify care plan, and during this, her sister called her. She hung up with me to see if they could conference call me, but  were unable to 3 way call me, so sister Michelle Vang called me back. She is able to articulate her mother's status and has been well updated. She discussing God's control in things. She states she would like comfort care for her mother, but states she wants both she and Michelle Vang to come see her this evening, and talk prior to making this decision formally. We discussed possible symptom management needs and placement. She states she would like hospice facility placement.   I think  that with shifting to comfort care, she will need symptom management such that hospice facility will be warranted instead of return to her facility.    Attending updated.        SUMMARY OF RECOMMENDATIONS   Daughters considering comfort care, but would like to meet this evening at bedside to discuss prior to formally making the decision.   When they are ready, would recommend hospice facility placement. Daughters would like hospice facility in [place] if possible, and if not, they would like a different hospice facility.   PMT will follow up Tuesday if still admitted.     Prognosis:  Poor       Primary Diagnoses: Present on Admission:  CAP (community acquired pneumonia)  Persistent atrial fibrillation (HCC)  CKD (chronic kidney disease), stage III (HCC)  Hypernatremia  Dementia without behavioral disturbance (HCC)   I have reviewed the medical record, interviewed the patient and family, and examined the patient. The following aspects are pertinent.  Past Medical History:  Diagnosis Date   Bladder cancer (HCC)    Diabetes mellitus without complication (HCC)    GERD (gastroesophageal reflux disease)    Hypertension  Lung cancer [facility])    Thyroid disease    Social History   Socioeconomic History   Marital status: Widowed    Spouse name: Not on file   Number of children: 2   Years of education: Not on file   Highest education level: Not on file  Occupational History   Occupation: Part time Conservation officer, nature  Tobacco Use   Smoking status: Never   Smokeless tobacco: Never  Vaping Use   Vaping Use: Never used  Substance and Sexual Activity   Alcohol use: No    Alcohol/week: 0.0 standard drinks of alcohol   Drug use: No   Sexual activity: Not Currently  Other Topics Concern   Not on file  Social History Narrative   Right handed      Completed 12th grade      Lives alone has care givers    Social Determinants of Health   Financial Resource Strain: Not on  file  Food Insecurity: No Food Insecurity (09/15/2022)   Hunger Vital Sign    Worried About Running Out of Food in the Last Year: Never true    Ran Out of Food in the Last Year: Never true  Transportation Needs: No Transportation Needs (09/15/2022)   PRAPARE - Administrator, Civil Service (Medical): No    Lack of Transportation (Non-Medical): No  Physical Activity: Not on file  Stress: Not on file  Social Connections: Not on file   Family History  Problem Relation Age of Onset   Stroke Mother    Heart disease Father    Cancer Sister        Pancreatic   Scheduled Meds:  apixaban  5 mg Oral BID   divalproex  500 mg Oral QHS   insulin aspart  0-15 Units Subcutaneous TID WC   insulin glargine-yfgn  6 Units Subcutaneous QHS   levothyroxine  75 mcg Oral Q0600   memantine  5 mg Oral BID   [START ON 09/19/2022] metoprolol succinate  25 mg Oral Daily   sertraline  25 mg Oral Daily   Continuous Infusions:  sodium chloride 50 mL/hr at 09/18/22 1423   cefTRIAXone (ROCEPHIN)  IV 2 g (09/17/22 1948)   metronidazole 500 mg (09/18/22 0842)   PRN Meds:.acetaminophen, ipratropium-albuterol, morphine injection Medications Prior to Admission:  Prior to Admission medications   Medication Sig Start Date End Date Taking? Authorizing Provider  acetaminophen (TYLENOL) 325 MG tablet Take 650 mg by mouth every 6 (six) hours as needed for mild pain or moderate pain.   Yes [provider]  apixaban (ELIQUIS) 2.5 MG TABS tablet Take 2 tablets (5 mg total) by mouth 2 (two) times daily. 02/14/21  Yes Hollice Espy, MD  atorvastatin (LIPITOR) 10 MG tablet TAKE 1 TABLET BY MOUTH ONCE A DAY 07/31/21  Yes Baity, Salvadore Oxford, NP  Blood Glucose Monitoring Suppl (TRUE FOCUS BLOOD GLUCOSE METER) DEVI 1 Device by Does not apply route daily. 02/21/21  Yes Lorre Munroe, NP  Cholecalciferol (VITAMIN D3) 1000 UNITS CAPS Take 1,000 Units by mouth daily.   Yes [provider]   dextromethorphan-guaiFENesin (MUCINEX DM) 30-600 MG 12hr tablet Take 1 tablet by mouth 2 (two) times daily as needed for cough.   Yes [provider]  divalproex (DEPAKOTE ER) 500 MG 24 hr tablet Take 500 mg by mouth at bedtime.   Yes [provider]  doxycycline (VIBRAMYCIN) 100 MG capsule Take 100 mg by mouth 2 (two) times daily. 09/13/22 09/20/22  Yes [provider]  glucose blood (TRUE FOCUS BLOOD GLUCOSE STRIP) test strip Use as instructed 02/21/21  Yes Baity, Salvadore Oxford, NP  insulin glargine (LANTUS) 100 UNIT/ML Solostar Pen Inject 10 Units into the skin daily. Patient taking differently: Inject 12 Units into the skin daily. 07/01/21  Yes Baity, Salvadore Oxford, NP  ipratropium-albuterol (DUONEB) 0.5-2.5 (3) MG/3ML SOLN Take 3 mLs by nebulization every 6 (six) hours as needed (wheezing/shortness of breath).   Yes [provider]  Lancets Letta Pate ULTRASOFT) lancets Use as instructed 02/21/21  Yes Baity, Salvadore Oxford, NP  levothyroxine (SYNTHROID) 75 MCG tablet Take 75 mcg by mouth daily. 06/04/22  Yes [provider]  memantine (NAMENDA) 5 MG tablet Take 5 mg by mouth 2 (two) times daily.   Yes [provider]  metoprolol succinate (TOPROL-XL) 25 MG 24 hr tablet TAKE 1 TABLET BY MOUTH ONCE A DAY Patient taking differently: Take 12.5 mg by mouth daily. 11/22/21  Yes Baity, Salvadore Oxford, NP  QUEtiapine (SEROQUEL) 50 MG tablet Take 50 mg by mouth at bedtime.   Yes [provider]  sertraline (ZOLOFT) 25 MG tablet Take 25 mg by mouth daily.   Yes [provider]  traZODone (DESYREL) 100 MG tablet Take 100 mg by mouth at bedtime.   Yes [provider]  traZODone (DESYREL) 50 MG tablet Take 25 mg by mouth daily as needed (agitation).   Yes [provider]   Allergies  Allergen Reactions   Aricept [Donepezil Hcl] Diarrhea   Review of Systems  Unable to perform ROS   Physical Exam Pulmonary:     Comments: Some WOB noted.   Neurological:     Mental Status: She is alert.     Vital Signs: BP (!) 136/93 (BP Location: Left Leg)   Pulse (!) 105   Temp (!) 97.2 F (36.2 C) (Oral)   Resp 17   Ht 5\' 4"  (1.626 m)   Wt 91.2 kg   SpO2 100%   BMI 34.50 kg/m  Pain Scale: PAINAD   Pain Score: Asleep   SpO2: SpO2: 100 % O2 Device:SpO2: 100 % O2 Flow Rate: .O2 Flow Rate (L/min): 3 L/min  IO: Intake/output summary:  Intake/Output Summary (Last 24 hours) at 09/18/2022 1547 Last data filed at 09/18/2022 0830 Gross per 24 hour  Intake 82.43 ml  Output 600 ml  Net -517.57 ml    LBM: Last BM Date : 09/17/22 Baseline Weight: Weight: 91.2 kg Most recent weight: Weight: 91.2 kg      Signed by: 11/16/22, NP   Please contact Palliative Medicine Team phone at [phone] for questions and concerns.  For individual provider: See [phone]

## 2022-09-18 NOTE — Inpatient Diabetes Management (Signed)
Inpatient Diabetes Program Recommendations  AACE/ADA: New Consensus Statement on Inpatient Glycemic Control   Target Ranges:  Prepandial:   less than 140 mg/dL      Peak postprandial:   less than 180 mg/dL (1-2 hours)      Critically ill patients:  140 - 180 mg/dL    Latest Reference Range & Units 09/17/22 07:48 09/17/22 07:49 09/17/22 11:04 09/17/22 16:13 09/17/22 22:24 09/18/22 07:36  Glucose-Capillary 70 - 99 mg/dL 418 (H) 377 (H) 333 (H) 357 (H) 287 (H) 265 (H)   Review of Glycemic Control  Diabetes history: DM2 Outpatient Diabetes medications: Lantus 12 units daily Current orders for Inpatient glycemic control: Semglee 6 units QHS, Novolog 0-15 units TID with meals; Solumedrol 40 mg daily   Inpatient Diabetes Program Recommendations:     Insulin: CBGs 287-418 mg/dl on 09/17/22 and fasting CBG 265 mg/dl today.  If steroids are continued as ordered, please consider increasing Semglee to 12 units daily and ordering Novolog 2 units TID with meals for meal coverage if patient eats at least 50% of meals.  Thanks, Barnie Alderman, RN, MSN, [place] Diabetes Coordinator Inpatient Diabetes Program [phone] (Team Pager from 8am to [place])

## 2022-09-19 DIAGNOSIS — J189 Pneumonia, unspecified organism: Secondary | ICD-10-CM | POA: Diagnosis not present

## 2022-09-19 DIAGNOSIS — F0390 Unspecified dementia without behavioral disturbance: Secondary | ICD-10-CM

## 2022-09-19 DIAGNOSIS — E870 Hyperosmolality and hypernatremia: Secondary | ICD-10-CM | POA: Diagnosis not present

## 2022-09-19 DIAGNOSIS — J9601 Acute respiratory failure with hypoxia: Secondary | ICD-10-CM | POA: Diagnosis present

## 2022-09-19 DIAGNOSIS — G9340 Encephalopathy, unspecified: Secondary | ICD-10-CM | POA: Diagnosis not present

## 2022-09-19 LAB — CBC WITH DIFFERENTIAL/PLATELET
Abs Immature Granulocytes: 0.44 10*3/uL — ABNORMAL HIGH (ref 0.00–0.07)
Basophils Absolute: 0.1 10*3/uL (ref 0.0–0.1)
Basophils Relative: 0 %
Eosinophils Absolute: 0 10*3/uL (ref 0.0–0.5)
Eosinophils Relative: 0 %
HCT: 33.5 % — ABNORMAL LOW (ref 36.0–46.0)
Hemoglobin: 10.7 g/dL — ABNORMAL LOW (ref 12.0–15.0)
Immature Granulocytes: 2 %
Lymphocytes Relative: 6 %
Lymphs Abs: 1.2 10*3/uL (ref 0.7–4.0)
MCH: 30.7 pg (ref 26.0–34.0)
MCHC: 31.9 g/dL (ref 30.0–36.0)
MCV: 96.3 fL (ref 80.0–100.0)
Monocytes Absolute: 2 10*3/uL — ABNORMAL HIGH (ref 0.1–1.0)
Monocytes Relative: 9 %
Neutro Abs: 17.8 10*3/uL — ABNORMAL HIGH (ref 1.7–7.7)
Neutrophils Relative %: 83 %
Platelets: 418 10*3/uL — ABNORMAL HIGH (ref 150–400)
RBC: 3.48 MIL/uL — ABNORMAL LOW (ref 3.87–5.11)
RDW: 14 % (ref 11.5–15.5)
WBC: 21.4 10*3/uL — ABNORMAL HIGH (ref 4.0–10.5)
nRBC: 0 % (ref 0.0–0.2)

## 2022-09-19 LAB — GLUCOSE, CAPILLARY
Glucose-Capillary: 171 mg/dL — ABNORMAL HIGH (ref 70–99)
Glucose-Capillary: 233 mg/dL — ABNORMAL HIGH (ref 70–99)
Glucose-Capillary: 199 mg/dL — ABNORMAL HIGH (ref 70–99)

## 2022-09-19 LAB — CULTURE, BLOOD (ROUTINE X 2)
Culture: NO GROWTH
Culture: NO GROWTH
Special Requests: ADEQUATE

## 2022-09-19 LAB — BASIC METABOLIC PANEL
Anion gap: 6 (ref 5–15)
BUN: 46 mg/dL — ABNORMAL HIGH (ref 8–23)
CO2: 23 mmol/L (ref 22–32)
Calcium: 8 mg/dL — ABNORMAL LOW (ref 8.9–10.3)
Chloride: 116 mmol/L — ABNORMAL HIGH (ref 98–111)
Creatinine, Ser: 0.79 mg/dL (ref 0.44–1.00)
GFR, Estimated: >60 mL/min (ref >60)
Glucose, Bld: 191 mg/dL — ABNORMAL HIGH (ref 70–99)
Potassium: 4 mmol/L (ref 3.5–5.1)
Sodium: 145 mmol/L (ref 135–145)

## 2022-09-19 LAB — MAGNESIUM: Magnesium: 1.8 mg/dL (ref 1.7–2.4)

## 2022-09-19 LAB — PHOSPHORUS: Phosphorus: 3 mg/dL (ref 2.5–4.6)

## 2022-09-19 MED ORDER — GLYCOPYRROLATE 0.2 MG/ML IJ SOLN
0.2000 mg | INTRAMUSCULAR | Status: DC | PRN
  Administered 2022-09-20: 0.2 mg via INTRAVENOUS
  Filled 2022-09-19 (×2): qty 1

## 2022-09-19 MED ORDER — MORPHINE SULFATE (PF) 2 MG/ML IV SOLN
2.0000 mg | INTRAVENOUS | Status: DC | PRN
  Administered 2022-09-20 – 2022-09-21 (×2): 2 mg via INTRAVENOUS
  Filled 2022-09-19 (×2): qty 1

## 2022-09-19 MED ORDER — LORAZEPAM 2 MG/ML IJ SOLN: 1.0000 mg | INTRAMUSCULAR | Status: DC | PRN

## 2022-09-19 NOTE — Progress Notes (Addendum)
Progress Note    Michelle Vang  EUM:[id number] DOB: 1929-01-27  DOA: 09/14/2022 PCP: [facility]      Brief Narrative:    Medical records reviewed and are as summarized below:  Michelle Vang is a 89 y.o. female with medical history significant for dementia, hypothyroidism, hypertension, persistent atrial fibrillation, stage IIIa CKD, CVA, type 2 diabetes, respiratory tract infection and started on doxycycline 4 days prior to admission.  She was brought from [facility] home because of lethargy/altered mental status.     Assessment/Plan:   Principal Problem:   CAP (community acquired pneumonia) Active Problems:   Acute encephalopathy   Persistent atrial fibrillation (HCC)   Insulin dependent type 2 diabetes mellitus (HCC)   CKD (chronic kidney disease), stage III (HCC)   History of CVA (cerebrovascular accident)   Dementia without behavioral disturbance (HCC)   Hypernatremia   Acute respiratory failure with hypoxia (HCC)    Body mass index is 34.5 kg/m. (Obesity)   Severe sepsis secondary to right-sided pneumonia with complete opacification/consolidation with possible pleural effusion/empyema, persistent leukocytosis: Continue IV ceftriaxone and Flagyl.  Family declined chest tube placement.   AKI on CKD stage IIIa, hypernatremia: Improved.  Discontinue IV fluids.   Type II DM with hyperglycemia: Glucose level improving after discontinuation of steroids.  Continue insulin glargine and NovoLog.  Continue insulin glargine and NovoLog.   Will hold off on increasing insulin dose for now.  Continue to monitor glucose levels.  Insulin will be increased if she is severely hyperglycemic.    Acute on chronic hypoxic respiratory failure: She is off of BiPAP and tolerating 3 L/min oxygen via nasal cannula.  ABG on 09/18/2022 was unremarkable.   Acute metabolic encephalopathy on underlying dementia: Continue supportive care  Dysphagia: Speech  therapist recommended dysphagia 1 diet.  Family understand she is still at high risk for aspiration.   Persistent atrial fibrillation with RVR: Continue metoprolol and Eliquis   Remote history of lung cancer s/p lobectomy   [name], RN, had informed me that family was called to complete and comfort care.  Plan discussed with [name], daughter, over the phone.  Goals of care were discussed.  She said that they were contemplating comfort care.  However, she wanted to talk to her sister before confirming this.  Continue current treatment for now.   ADDENDUM   I spoke to [name] who said that they have opted for comfort measures.  She asked me to speak to  [name], sister, regarding decision to continue or discontinue medications not related to comfort care.  I spoke to [name] who also confirmed that they wanted comfort care.  She said it was okay to discontinue nonessential medications and focus on medications for comfort care only.  Plan to consult hospice tomorrow.    Diet Order             DIET - DYS 1 Room service appropriate? Yes with Assist; Fluid consistency: Thin  Diet effective now                            Consultants: None  Procedures: None    Medications:    apixaban  5 mg Oral BID   divalproex  500 mg Oral QHS   insulin aspart  0-15 Units Subcutaneous TID WC   insulin glargine-yfgn  6 Units Subcutaneous QHS   levothyroxine  75 mcg Oral Q0600   memantine  5  mg Oral BID   metoprolol succinate  25 mg Oral Daily   sertraline  25 mg Oral Daily   Continuous Infusions:  metronidazole 500 mg (09/19/22 0835)     Anti-infectives (From admission, onward)    Start     Dose/Rate Route Frequency Ordered Stop   09/16/22 0500  vancomycin (VANCOREADY) IVPB 1250 mg/250 mL  Status:  Discontinued        1,250 mg 166.7 mL/hr over 90 Minutes Intravenous Every 36 hours 09/14/22 2053 09/15/22 0742   09/14/22 2200  metroNIDAZOLE (FLAGYL) IVPB 500 mg        500  mg 100 mL/hr over 60 Minutes Intravenous Every 12 hours 09/14/22 1945     09/14/22 2000  cefTRIAXone (ROCEPHIN) 2 g in sodium chloride 0.9 % 100 mL IVPB        2 g 200 mL/hr over 30 Minutes Intravenous Every 24 hours 09/14/22 1945 09/18/22 2016   09/14/22 1600  vancomycin (VANCOCIN) IVPB 1000 mg/200 mL premix       See Hyperspace for full Linked Orders Report.   1,000 mg 200 mL/hr over 60 Minutes Intravenous  Once 09/14/22 1553 09/14/22 1848   09/14/22 1600  vancomycin (VANCOCIN) IVPB 1000 mg/200 mL premix       See Hyperspace for full Linked Orders Report.   1,000 mg 200 mL/hr over 60 Minutes Intravenous  Once 09/14/22 1553 09/14/22 1745   09/14/22 1545  ceFEPIme (MAXIPIME) 2 g in sodium chloride 0.9 % 100 mL IVPB        2 g 200 mL/hr over 30 Minutes Intravenous  Once 09/14/22 1541 09/14/22 1627   09/14/22 1545  metroNIDAZOLE (FLAGYL) IVPB 500 mg        500 mg 100 mL/hr over 60 Minutes Intravenous  Once 09/14/22 1541 09/14/22 1744   09/14/22 1545  vancomycin (VANCOCIN) IVPB 1000 mg/200 mL premix  Status:  Discontinued        1,000 mg 200 mL/hr over 60 Minutes Intravenous  Once 09/14/22 1541 09/14/22 1553              Family Communication/Anticipated D/C date and plan/Code Status   DVT prophylaxis:  apixaban (ELIQUIS) tablet 5 mg     Code Status: DNR  Family Communication: [place], daughter, over the phone Disposition Plan: Plan to discharge to SNF when medically stable   Status is: Inpatient Remains inpatient appropriate because: Hypoxia, change in mental status, on IV antibiotics       Subjective:   Interval events noted.  She is nonverbal at this time and cannot provide any history.  Private caregiver was at the bedside.   Objective:    Vitals:   09/19/22 0400 09/19/22 0754 09/19/22 1214 09/19/22 1638  BP:  (!) 153/76 (!) 149/80 (!) 141/79  Pulse:  (!) 104 100 99  Resp:  19 (!) 22 18  Temp:  97.9 F (36.6 C) 98.6 F (37 C) 98.8 F (37.1 C)   TempSrc:  Axillary    SpO2: 99% 98% 100% 96%  Weight:      Height:       No data found.   Intake/Output Summary (Last 24 hours) at 09/19/2022 1725 Last data filed at 09/19/2022 1017 Gross per 24 hour  Intake 240 ml  Output 600 ml  Net -360 ml   Filed Weights   09/14/22 1402  Weight: 91.2 kg    Exam:  GEN: NAD SKIN: Warm and dry EYES: No pallor or icterus ENT: MMM CV: RRR  PULM: CTA B ABD: soft, ND, NT, +BS CNS: Alert but nonverbal EXT: No edema or tenderness      Data Reviewed:   I have personally reviewed following labs and imaging studies:  Labs: Labs show the following:   Basic Metabolic Panel: Recent Labs  Lab 09/14/22 1404 09/15/22 0607 09/17/22 0552 09/18/22 0538 09/19/22 0701  NA 148* 148* 146* 148* 145  K 4.0 4.2 3.9 3.8 4.0  CL 117* 113* 114* 116* 116*  CO2 25 26 20* 22 23  GLUCOSE 329* 229* 428* 321* 191*  BUN 46* 44* 56* 55* 46*  CREATININE 1.26* 1.02* 1.13* 1.03* 0.79  CALCIUM 7.8* 8.3* 8.3* 8.2* 8.0*  MG  --   --   --  1.9 1.8  PHOS  --   --   --  3.6 3.0   GFR Estimated Creatinine Clearance: 50.1 mL/min (by C-G formula based on SCr of 0.79 mg/dL). Liver Function Tests: Recent Labs  Lab 09/14/22 1404 09/15/22 0607  AST 60* 51*  ALT 45* 44  ALKPHOS 144* 141*  BILITOT 0.7 1.0  PROT 5.9* 6.6  ALBUMIN 1.8* 1.9*   No results for input(s): "LIPASE", "AMYLASE" in the last 168 hours. No results for input(s): "AMMONIA" in the last 168 hours. Coagulation profile Recent Labs  Lab 09/14/22 1404  INR 2.5*    CBC: Recent Labs  Lab 09/14/22 1404 09/15/22 0607 09/17/22 0552 09/18/22 0538 09/19/22 0701  WBC 23.5* 28.6* 24.1* 22.6* 21.4*  NEUTROABS 18.0* 22.6*  --  18.9* 17.8*  HGB 10.8* 11.6* 10.5* 10.4* 10.7*  HCT 34.2* 37.4 32.6* 32.4* 33.5*  MCV 96.6 97.9 94.8 95.0 96.3  PLT 276 346 376 406* 418*   Cardiac Enzymes: No results for input(s): "CKTOTAL", "CKMB", "CKMBINDEX", "TROPONINI" in the last 168 hours. BNP (last 3  results) No results for input(s): "PROBNP" in the last 8760 hours. CBG: Recent Labs  Lab 09/18/22 1814 09/18/22 2139 09/19/22 0759 09/19/22 1207 09/19/22 1633  GLUCAP 212* 171* 171* 233* 199*   D-Dimer: No results for input(s): "DDIMER" in the last 72 hours. Hgb A1c: No results for input(s): "HGBA1C" in the last 72 hours. Lipid Profile: No results for input(s): "CHOL", "HDL", "LDLCALC", "TRIG", "CHOLHDL", "LDLDIRECT" in the last 72 hours. Thyroid function studies: No results for input(s): "TSH", "T4TOTAL", "T3FREE", "THYROIDAB" in the last 72 hours.  Invalid input(s): "FREET3" Anemia work up: No results for input(s): "VITAMINB12", "FOLATE", "FERRITIN", "TIBC", "IRON", "RETICCTPCT" in the last 72 hours. Sepsis Labs: Recent Labs  Lab 09/14/22 1404 09/14/22 1543 09/15/22 0607 09/17/22 0552 09/18/22 0538 09/19/22 0701  WBC 23.5*  --  28.6* 24.1* 22.6* 21.4*  LATICACIDVEN 3.0* 2.8*  --   --   --   --     Microbiology Recent Results (from the past 240 hour(s))  Culture, blood (Routine x 2)     Status: None   Collection Time: 09/14/22  2:04 PM   Specimen: BLOOD  Result Value Ref Range Status   Specimen Description BLOOD LEFT ANTECUBITAL  Final   Special Requests   Final    BOTTLES DRAWN AEROBIC AND ANAEROBIC Blood Culture results may not be optimal due to an excessive volume of blood received in culture bottles   Culture   Final    NO GROWTH 5 DAYS Performed at [facility], [address] [place], [place] [address]    Report Status 09/19/2022 FINAL  Final  Resp panel by RT-PCR (RSV, Flu A&B, Covid) Anterior Nasal Swab  Status: None   Collection Time: 09/14/22  2:09 PM   Specimen: Anterior Nasal Swab  Result Value Ref Range Status   SARS Coronavirus 2 by RT PCR NEGATIVE NEGATIVE Final    Comment: (NOTE) SARS-CoV-2 target nucleic acids are NOT DETECTED.  The SARS-CoV-2 RNA is generally detectable in upper respiratory specimens during the acute phase  of infection. The lowest concentration of SARS-CoV-2 viral copies this assay can detect is 138 copies/mL. A negative result does not preclude SARS-Cov-2 infection and should not be used as the sole basis for treatment or other patient management decisions. A negative result may occur with  improper specimen collection/handling, submission of specimen other than nasopharyngeal swab, presence of viral mutation(s) within the areas targeted by this assay, and inadequate number of viral copies(<138 copies/mL). A negative result must be combined with clinical observations, patient history, and epidemiological information. The expected result is Negative.  Fact Sheet for Patients:  EntrepreneurPulse.com.au  Fact Sheet for Healthcare Providers:  IncredibleEmployment.be  This test is no t yet approved or cleared by the [place] FDA and  has been authorized for detection and/or diagnosis of SARS-CoV-2 by FDA under an Emergency Use Authorization (EUA). This EUA will remain  in effect (meaning this test can be used) for the duration of the COVID-19 declaration under Section 564(b)(1) of the Act, 21 U.S.C.section 360bbb-3(b)(1), unless the authorization is terminated  or revoked sooner.       Influenza A by PCR NEGATIVE NEGATIVE Final   Influenza B by PCR NEGATIVE NEGATIVE Final    Comment: (NOTE) The Xpert Xpress SARS-CoV-2/FLU/RSV plus assay is intended as an aid in the diagnosis of influenza from Nasopharyngeal swab specimens and should not be used as a sole basis for treatment. Nasal washings and aspirates are unacceptable for Xpert Xpress SARS-CoV-2/FLU/RSV testing.  Fact Sheet for Patients: EntrepreneurPulse.com.au  Fact Sheet for Healthcare Providers: IncredibleEmployment.be  This test is not yet approved or cleared by the [place] FDA and has been authorized for detection and/or diagnosis of  SARS-CoV-2 by FDA under an Emergency Use Authorization (EUA). This EUA will remain in effect (meaning this test can be used) for the duration of the COVID-19 declaration under Section 564(b)(1) of the Act, 21 U.S.C. section 360bbb-3(b)(1), unless the authorization is terminated or revoked.     Resp Syncytial Virus by PCR NEGATIVE NEGATIVE Final    Comment: (NOTE) Fact Sheet for Patients: EntrepreneurPulse.com.au  Fact Sheet for Healthcare Providers: IncredibleEmployment.be  This test is not yet approved or cleared by the [place] FDA and has been authorized for detection and/or diagnosis of SARS-CoV-2 by FDA under an Emergency Use Authorization (EUA). This EUA will remain in effect (meaning this test can be used) for the duration of the COVID-19 declaration under Section 564(b)(1) of the Act, 21 U.S.C. section 360bbb-3(b)(1), unless the authorization is terminated or revoked.  Performed at [facility], [address] [place], [place] [address]   Culture, blood (Routine x 2)     Status: None   Collection Time: 09/14/22  3:43 PM   Specimen: BLOOD  Result Value Ref Range Status   Specimen Description BLOOD BLOOD RIGHT HAND  Final   Special Requests   Final    BOTTLES DRAWN AEROBIC AND ANAEROBIC Blood Culture adequate volume   Culture   Final    NO GROWTH 5 DAYS Performed at [facility], [address], [place],  [address]    Report Status 09/19/2022 FINAL  Final  MRSA Next  Gen by PCR, Nasal     Status: None   Collection Time: 09/14/22 11:08 PM   Specimen: Nasal Mucosa; Nasal Swab  Result Value Ref Range Status   MRSA by PCR Next Gen NOT DETECTED NOT DETECTED Final    Comment: (NOTE) The GeneXpert MRSA Assay (FDA approved for NASAL specimens only), is one component of a comprehensive MRSA colonization surveillance program. It is not intended to diagnose MRSA infection nor to guide or monitor treatment  for MRSA infections. Test performance is not FDA approved in patients less than 49 years old. Performed at [facility], [address] [place], [place] [address]     Procedures and diagnostic studies:  DG Chest [facility] 1 View  Result Date: 09/17/2022 CLINICAL DATA:  Increasing shortness of breath EXAM: PORTABLE CHEST 1 VIEW COMPARISON:  09/14/2022 FINDINGS: Cardiac shadow is stable but obscured on the right. Complete opacification of the right hemithorax is now seen consistent with increasing effusion and consolidation. Left lung is clear. No acute bony abnormality is noted. IMPRESSION: Complete opacification of the right hemithorax consistent with enlarging effusion and consolidation when compared with the recent exam. Electronically Signed   By: Inez Catalina M.D.   On: 09/17/2022 21:43               LOS: 5 days   Jaise Moser  [facility]   Pager on [email or web address]. If 7PM-7AM, please contact night-coverage at [email or web address]     09/19/2022, 5:25 PM

## 2022-09-19 NOTE — Progress Notes (Signed)
  Interdisciplinary Goals of Care Family Meeting   Date carried out: 09/19/2022  Location of the meeting: Phone conference  Member's involved: Physician and Family Member or next of kin  Durable Power of Attorney or acting medical decision maker: [name] and Wilson, daughters  Discussion: We discussed goals of care for Michelle Vang .    Code status:   Code Status: DNR   Disposition: In-patient comfort care  Time spent for the meeting: 15 minutes    Jennye Boroughs, MD  09/19/2022, 6:03 PM

## 2022-09-20 DIAGNOSIS — J918 Pleural effusion in other conditions classified elsewhere: Secondary | ICD-10-CM | POA: Diagnosis not present

## 2022-09-20 DIAGNOSIS — J189 Pneumonia, unspecified organism: Secondary | ICD-10-CM | POA: Diagnosis not present

## 2022-09-20 DIAGNOSIS — J9601 Acute respiratory failure with hypoxia: Secondary | ICD-10-CM | POA: Diagnosis not present

## 2022-09-20 DIAGNOSIS — G9340 Encephalopathy, unspecified: Secondary | ICD-10-CM | POA: Diagnosis not present

## 2022-09-20 NOTE — TOC Progression Note (Signed)
Transition of Care [facility]) - Progression Note    Patient Details  Name: Michelle Vang MRN: [id number] Date of Birth: 31-Jan-1929  Transition of Care [facility] CM/SW Contact  Izola Price, RN Phone Number: 09/20/2022, 2:24 PM  Clinical Narrative:  1/6: After a family meeting on 09/19/21 documented by provider, patient plan is for a DNR code status and Comfort Care. Simmie Davies RN CM     Expected Discharge Plan: Skilled Nursing Facility Barriers to Discharge: Continued Medical Work up  Expected Discharge Plan and Services       Living arrangements for the past 2 months: [place] [facility]                                       Social Determinants of Health (SDOH) Interventions SDOH Screenings   Food Insecurity: No Food Insecurity (09/15/2022)  Housing: Low Risk  (09/15/2022)  Transportation Needs: No Transportation Needs (09/15/2022)  Utilities: Not At Risk (09/15/2022)  Alcohol Screen: Low Risk  (02/20/2021)  Depression (PHQ2-9): Low Risk  (05/10/2020)  Tobacco Use: Low Risk  (09/15/2022)    Readmission Risk Interventions     No data to display

## 2022-09-20 NOTE — Progress Notes (Addendum)
Progress Note    Michelle Vang  DJT:[id number] DOB: [birth year]  DOA: 09/14/2022 PCP: [facility]      Brief Narrative:    Medical records reviewed and are as summarized below:  Michelle Vang is a 89 y.o. female with medical history significant for dementia, hypothyroidism, hypertension, persistent atrial fibrillation, stage IIIa CKD, CVA, type 2 diabetes, respiratory tract infection and started on doxycycline 4 days prior to admission.  She was brought from [facility] home because of lethargy/altered mental status.     Assessment/Plan:   Principal Problem:   CAP (community acquired pneumonia) Active Problems:   Acute encephalopathy   Persistent atrial fibrillation (HCC)   Insulin dependent type 2 diabetes mellitus (HCC)   CKD (chronic kidney disease), stage III (HCC)   History of CVA (cerebrovascular accident)   Dementia without behavioral disturbance (HCC)   Hypernatremia   Acute respiratory failure with hypoxia (HCC)    Body mass index is 34.5 kg/m. (Obesity)      Severe sepsis secondary to right-sided pneumonia with complete opacification/consolidation  Probable right pleural effusion/empyema-family declined chest tube Persistent leukocytosis AKI on CKD stage IIIa Hypernatremia Type II DM with hyperglycemia Acute on chronic hypoxic respiratory failure Acute metabolic encephalopathy Dementia Persistent atrial fibrillation with RVR Remote history of lung cancer s/p lobectomy Dysphagia   PLAN  Continue comfort care Plan discussed with [name] (daughter) and son-in-law (Penny's husband) at the bedside Consulted hospice team.    Diet Order     None                Consultants: None  Procedures: None    Medications:     Continuous Infusions:     Anti-infectives (From admission, onward)    Start     Dose/Rate Route Frequency Ordered Stop   09/16/22 0500  vancomycin (VANCOREADY) IVPB 1250  mg/250 mL  Status:  Discontinued        1,250 mg 166.7 mL/hr over 90 Minutes Intravenous Every 36 hours 09/14/22 2053 09/15/22 0742   09/14/22 2200  metroNIDAZOLE (FLAGYL) IVPB 500 mg  Status:  Discontinued        500 mg 100 mL/hr over 60 Minutes Intravenous Every 12 hours 09/14/22 1945 09/19/22 1754   09/14/22 2000  cefTRIAXone (ROCEPHIN) 2 g in sodium chloride 0.9 % 100 mL IVPB        2 g 200 mL/hr over 30 Minutes Intravenous Every 24 hours 09/14/22 1945 09/19/22 2359   09/14/22 1600  vancomycin (VANCOCIN) IVPB 1000 mg/200 mL premix       See Hyperspace for full Linked Orders Report.   1,000 mg 200 mL/hr over 60 Minutes Intravenous  Once 09/14/22 1553 09/14/22 1848   09/14/22 1600  vancomycin (VANCOCIN) IVPB 1000 mg/200 mL premix       See Hyperspace for full Linked Orders Report.   1,000 mg 200 mL/hr over 60 Minutes Intravenous  Once 09/14/22 1553 09/14/22 1745   09/14/22 1545  ceFEPIme (MAXIPIME) 2 g in sodium chloride 0.9 % 100 mL IVPB        2 g 200 mL/hr over 30 Minutes Intravenous  Once 09/14/22 1541 09/14/22 1627   09/14/22 1545  metroNIDAZOLE (FLAGYL) IVPB 500 mg        500 mg 100 mL/hr over 60 Minutes Intravenous  Once 09/14/22 1541 09/14/22 1744   09/14/22 1545  vancomycin (VANCOCIN) IVPB 1000 mg/200 mL premix  Status:  Discontinued  1,000 mg 200 mL/hr over 60 Minutes Intravenous  Once 09/14/22 1541 09/14/22 1553              Family Communication/Anticipated D/C date and plan/Code Status   DVT prophylaxis:      Code Status: DNR  Family Communication: [name], daughter, and Penny's husband at the bedside Disposition Plan: Plan to discharge to hospice house   Status is: Inpatient Remains inpatient appropriate because: Comfort care       Subjective:   Interval events noted.  She is aphasic and unable to provide any history.  [name], daughter, and her son-in-law were at the bedside.   Objective:    Vitals:   09/19/22 2041 09/19/22 2350  09/20/22 0423 09/20/22 0738  BP: 138/77 135/78 136/83 136/83  Pulse: 95 95 99 (!) 116  Resp: 18 18 20 15   Temp: 98.6 F (37 C) 98.5 F (36.9 C) 98.3 F (36.8 C) 97.6 F (36.4 C)  TempSrc: Oral Oral Oral Axillary  SpO2: 98% 95% 98% 100%  Weight:      Height:       No data found.   Intake/Output Summary (Last 24 hours) at 09/20/2022 1608 Last data filed at 09/19/2022 2000 Gross per 24 hour  Intake 0 ml  Output 100 ml  Net -100 ml   Filed Weights   09/14/22 1402  Weight: 91.2 kg    Exam:  GEN: NAD SKIN: Warm and dry EYES: EOMI ENT: MMM CV: RRR PULM: CTA B ABD: soft, obese, NT, +BS CNS: Alert but aphasic.  She moves extremities spontaneously. EXT: No edema or tenderness       Data Reviewed:   I have personally reviewed following labs and imaging studies:  Labs: Labs show the following:   Basic Metabolic Panel: Recent Labs  Lab 09/14/22 1404 09/15/22 0607 09/17/22 0552 09/18/22 0538 09/19/22 0701  NA 148* 148* 146* 148* 145  K 4.0 4.2 3.9 3.8 4.0  CL 117* 113* 114* 116* 116*  CO2 25 26 20* 22 23  GLUCOSE 329* 229* 428* 321* 191*  BUN 46* 44* 56* 55* 46*  CREATININE 1.26* 1.02* 1.13* 1.03* 0.79  CALCIUM 7.8* 8.3* 8.3* 8.2* 8.0*  MG  --   --   --  1.9 1.8  PHOS  --   --   --  3.6 3.0   GFR Estimated Creatinine Clearance: 50.1 mL/min (by C-G formula based on SCr of 0.79 mg/dL). Liver Function Tests: Recent Labs  Lab 09/14/22 1404 09/15/22 0607  AST 60* 51*  ALT 45* 44  ALKPHOS 144* 141*  BILITOT 0.7 1.0  PROT 5.9* 6.6  ALBUMIN 1.8* 1.9*   No results for input(s): "LIPASE", "AMYLASE" in the last 168 hours. No results for input(s): "AMMONIA" in the last 168 hours. Coagulation profile Recent Labs  Lab 09/14/22 1404  INR 2.5*    CBC: Recent Labs  Lab 09/14/22 1404 09/15/22 0607 09/17/22 0552 09/18/22 0538 09/19/22 0701  WBC 23.5* 28.6* 24.1* 22.6* 21.4*  NEUTROABS 18.0* 22.6*  --  18.9* 17.8*  HGB 10.8* 11.6* 10.5* 10.4* 10.7*   HCT 34.2* 37.4 32.6* 32.4* 33.5*  MCV 96.6 97.9 94.8 95.0 96.3  PLT 276 346 376 406* 418*   Cardiac Enzymes: No results for input(s): "CKTOTAL", "CKMB", "CKMBINDEX", "TROPONINI" in the last 168 hours. BNP (last 3 results) No results for input(s): "PROBNP" in the last 8760 hours. CBG: Recent Labs  Lab 09/18/22 1814 09/18/22 2139 09/19/22 0759 09/19/22 1207 09/19/22 1633  GLUCAP 212* 171*  171* 233* 199*   D-Dimer: No results for input(s): "DDIMER" in the last 72 hours. Hgb A1c: No results for input(s): "HGBA1C" in the last 72 hours. Lipid Profile: No results for input(s): "CHOL", "HDL", "LDLCALC", "TRIG", "CHOLHDL", "LDLDIRECT" in the last 72 hours. Thyroid function studies: No results for input(s): "TSH", "T4TOTAL", "T3FREE", "THYROIDAB" in the last 72 hours.  Invalid input(s): "FREET3" Anemia work up: No results for input(s): "VITAMINB12", "FOLATE", "FERRITIN", "TIBC", "IRON", "RETICCTPCT" in the last 72 hours. Sepsis Labs: Recent Labs  Lab 09/14/22 1404 09/14/22 1543 09/15/22 0607 09/17/22 0552 09/18/22 0538 09/19/22 0701  WBC 23.5*  --  28.6* 24.1* 22.6* 21.4*  LATICACIDVEN 3.0* 2.8*  --   --   --   --     Microbiology Recent Results (from the past 240 hour(s))  Culture, blood (Routine x 2)     Status: None   Collection Time: 09/14/22  2:04 PM   Specimen: BLOOD  Result Value Ref Range Status   Specimen Description BLOOD LEFT ANTECUBITAL  Final   Special Requests   Final    BOTTLES DRAWN AEROBIC AND ANAEROBIC Blood Culture results may not be optimal due to an excessive volume of blood received in culture bottles   Culture   Final    NO GROWTH 5 DAYS Performed at [facility], [address] [place], [place] [address]    Report Status 09/19/2022 FINAL  Final  Resp panel by RT-PCR (RSV, Flu A&B, Covid) Anterior Nasal Swab     Status: None   Collection Time: 09/14/22  2:09 PM   Specimen: Anterior Nasal Swab  Result Value Ref Range Status   SARS  Coronavirus 2 by RT PCR NEGATIVE NEGATIVE Final    Comment: (NOTE) SARS-CoV-2 target nucleic acids are NOT DETECTED.  The SARS-CoV-2 RNA is generally detectable in upper respiratory specimens during the acute phase of infection. The lowest concentration of SARS-CoV-2 viral copies this assay can detect is 138 copies/mL. A negative result does not preclude SARS-Cov-2 infection and should not be used as the sole basis for treatment or other patient management decisions. A negative result may occur with  improper specimen collection/handling, submission of specimen other than nasopharyngeal swab, presence of viral mutation(s) within the areas targeted by this assay, and inadequate number of viral copies(<138 copies/mL). A negative result must be combined with clinical observations, patient history, and epidemiological information. The expected result is Negative.  Fact Sheet for Patients:  EntrepreneurPulse.com.au  Fact Sheet for Healthcare Providers:  IncredibleEmployment.be  This test is no t yet approved or cleared by the [place] FDA and  has been authorized for detection and/or diagnosis of SARS-CoV-2 by FDA under an Emergency Use Authorization (EUA). This EUA will remain  in effect (meaning this test can be used) for the duration of the COVID-19 declaration under Section 564(b)(1) of the Act, 21 U.S.C.section 360bbb-3(b)(1), unless the authorization is terminated  or revoked sooner.       Influenza A by PCR NEGATIVE NEGATIVE Final   Influenza B by PCR NEGATIVE NEGATIVE Final    Comment: (NOTE) The Xpert Xpress SARS-CoV-2/FLU/RSV plus assay is intended as an aid in the diagnosis of influenza from Nasopharyngeal swab specimens and should not be used as a sole basis for treatment. Nasal washings and aspirates are unacceptable for Xpert Xpress SARS-CoV-2/FLU/RSV testing.  Fact Sheet for  Patients: EntrepreneurPulse.com.au  Fact Sheet for Healthcare Providers: IncredibleEmployment.be  This test is not yet approved or cleared by the [place] and  has been authorized for detection and/or diagnosis of SARS-CoV-2 by FDA under an Emergency Use Authorization (EUA). This EUA will remain in effect (meaning this test can be used) for the duration of the COVID-19 declaration under Section 564(b)(1) of the Act, 21 U.S.C. section 360bbb-3(b)(1), unless the authorization is terminated or revoked.     Resp Syncytial Virus by PCR NEGATIVE NEGATIVE Final    Comment: (NOTE) Fact Sheet for Patients: EntrepreneurPulse.com.au  Fact Sheet for Healthcare Providers: IncredibleEmployment.be  This test is not yet approved or cleared by the [place] FDA and has been authorized for detection and/or diagnosis of SARS-CoV-2 by FDA under an Emergency Use Authorization (EUA). This EUA will remain in effect (meaning this test can be used) for the duration of the COVID-19 declaration under Section 564(b)(1) of the Act, 21 U.S.C. section 360bbb-3(b)(1), unless the authorization is terminated or revoked.  Performed at [facility], [address] [place], [place] [address]   Culture, blood (Routine x 2)     Status: None   Collection Time: 09/14/22  3:43 PM   Specimen: BLOOD  Result Value Ref Range Status   Specimen Description BLOOD BLOOD RIGHT HAND  Final   Special Requests   Final    BOTTLES DRAWN AEROBIC AND ANAEROBIC Blood Culture adequate volume   Culture   Final    NO GROWTH 5 DAYS Performed at [facility], [address] [place], [place] [address]    Report Status 09/19/2022 FINAL  Final  MRSA Next Gen by PCR, Nasal     Status: None   Collection Time: 09/14/22 11:08 PM   Specimen: Nasal Mucosa; Nasal Swab  Result Value Ref Range Status   MRSA by PCR Next Gen NOT DETECTED NOT  DETECTED Final    Comment: (NOTE) The GeneXpert MRSA Assay (FDA approved for NASAL specimens only), is one component of a comprehensive MRSA colonization surveillance program. It is not intended to diagnose MRSA infection nor to guide or monitor treatment for MRSA infections. Test performance is not FDA approved in patients less than 69 years old. Performed at [facility], [address] [place],  [address]     Procedures and diagnostic studies:  No results found.             LOS: 6 days   Uriyah Massimo  [facility]   Pager on [email or web address]. If 7PM-7AM, please contact night-coverage at [email or web address]     09/20/2022, 4:08 PM

## 2022-09-20 NOTE — Progress Notes (Signed)
[facility]) Hospice hospital liaison note  Received request from TOC/MD for family interest in hospice home.   Chart reviewed and hospice home eligibility is Pending at this time. Talked with daughter by phone to acknowledge and explain services and bedside assessment planned for Sunday morning.   Unfortunately hospice home does not have a bed to offer today. TOC is aware hospital liaison will follow up tomorrow or sooner if room becomes available.   Please do not hesitate to call with any hospice related questions or concerns.   Thank you for the opportunity to participate in this patient's care.  Jhonnie Garner, Therapist, sports, [facility] Liaison  [phone]

## 2022-09-21 DIAGNOSIS — J9601 Acute respiratory failure with hypoxia: Secondary | ICD-10-CM | POA: Diagnosis not present

## 2022-09-21 DIAGNOSIS — J189 Pneumonia, unspecified organism: Secondary | ICD-10-CM | POA: Diagnosis not present

## 2022-09-21 DIAGNOSIS — J918 Pleural effusion in other conditions classified elsewhere: Secondary | ICD-10-CM | POA: Diagnosis not present

## 2022-09-21 DIAGNOSIS — F0390 Unspecified dementia without behavioral disturbance: Secondary | ICD-10-CM | POA: Diagnosis not present

## 2022-09-21 NOTE — Discharge Summary (Addendum)
Physician Discharge Summary   Patient: Michelle Vang MRN: [id number] DOB: Feb 22, 1929  Admit date:     09/14/2022  Discharge date: 09/21/22  Discharge Physician: Jennye Boroughs   PCP: [facility]   Recommendations at discharge:   Follow-up with hospice team within 24 hours of discharge  Discharge Diagnoses: Principal Problem:   CAP (community acquired pneumonia) Active Problems:   Parapneumonic effusion   Acute encephalopathy   Persistent atrial fibrillation (HCC)   Insulin dependent type 2 diabetes mellitus ([place])   CKD (chronic kidney disease), stage III ([place])   History of CVA (cerebrovascular accident)   Dementia without behavioral disturbance ([place])   Hypernatremia   Acute respiratory failure with hypoxia ([place])  Resolved Problems:   * No resolved hospital problems. [facility] Course:  Ms. Michelle Vang is a 89 y.o. female with medical history significant for dementia, hypothyroidism, hypertension, persistent atrial fibrillation, stage IIIa CKD, CVA, type 2 diabetes, respiratory tract infection and started on doxycycline 4 days prior to admission.  She was brought from [facility] home because of lethargy/altered mental status.    She was admitted to the hospital for severe sepsis secondary to right-sided pneumonia with complete opacification or consolidation with possible right pleural effusion/empyema complicated by acute on chronic hypoxic respiratory failure and acute metabolic encephalopathy.  She was treated with IV fluids and empiric IV antibiotics.  Her family declined chest tube placement for possible parapneumonic effusion/empyema.  She required BiPAP for acute hypoxic respiratory failure at some point.  She also had AKI but this improved with IV fluids.  She had  hyperglycemia from type II DM and was receiving insulin for this.  Speech therapist recommended dysphagia 1 diet for dysphagia.  Because of advanced age, dementia, aphasia,  multiple comorbidities and minimal chance for any meaningful recovery, family requested comfort measures with hospice.  She was transitioned to comfort care.  Hospice team was consulted and patient is deemed to be a candidate for comfort care at residential hospice home.  She will be discharged to hospice home today.  Plan discussed with [name], daughter, at the bedside.        Consultants: None Procedures performed: None  Disposition: Hospice care Diet recommendation:  Discharge Diet Orders (From admission, onward)     Start     Ordered   09/21/22 0000  Diet general        09/21/22 1138           Regular diet DISCHARGE MEDICATION: Allergies as of 09/21/2022       Reactions   Aricept [donepezil Hcl] Diarrhea        Medication List     STOP taking these medications    acetaminophen 325 MG tablet Commonly known as: TYLENOL   apixaban 2.5 MG Tabs tablet Commonly known as: ELIQUIS   atorvastatin 10 MG tablet Commonly known as: LIPITOR   dextromethorphan-guaiFENesin 30-600 MG 12hr tablet Commonly known as: MUCINEX DM   divalproex 500 MG 24 hr tablet Commonly known as: DEPAKOTE ER   doxycycline 100 MG capsule Commonly known as: VIBRAMYCIN   insulin glargine 100 UNIT/ML Solostar Pen Commonly known as: LANTUS   ipratropium-albuterol 0.5-2.5 (3) MG/3ML Soln Commonly known as: DUONEB   levothyroxine 75 MCG tablet Commonly known as: SYNTHROID   memantine 5 MG tablet Commonly known as: NAMENDA   metoprolol succinate 25 MG 24 hr tablet Commonly known as: TOPROL-XL   onetouch ultrasoft lancets   QUEtiapine 50 MG tablet Commonly known  as: SEROQUEL   sertraline 25 MG tablet Commonly known as: ZOLOFT   traZODone 100 MG tablet Commonly known as: DESYREL   traZODone 50 MG tablet Commonly known as: DESYREL   True Focus Blood Glucose Meter Devi   True Focus Blood Glucose Strip test strip Generic drug: glucose blood   Vitamin D3 25 MCG (1000 UT) Caps         Discharge Exam: Filed Weights   09/14/22 1402  Weight: 91.2 kg   GEN: NAD SKIN: Warm and dry EYES: Anicteric, no pallor ENT: MMM CV: RRR PULM: Coarse breath sounds on the right ABD: soft, ND, NT, +BS CNS: Alert, aphasic EXT: No edema or tenderness   Condition at discharge: stable  The results of significant diagnostics from this hospitalization (including imaging, microbiology, ancillary and laboratory) are listed below for reference.   Imaging Studies: DG Chest Port 1 View  Result Date: 09/17/2022 CLINICAL DATA:  Increasing shortness of breath EXAM: PORTABLE CHEST 1 VIEW COMPARISON:  09/14/2022 FINDINGS: Cardiac shadow is stable but obscured on the right. Complete opacification of the right hemithorax is now seen consistent with increasing effusion and consolidation. Left lung is clear. No acute bony abnormality is noted. IMPRESSION: Complete opacification of the right hemithorax consistent with enlarging effusion and consolidation when compared with the recent exam. Electronically Signed   By: Inez Catalina M.D.   On: 09/17/2022 21:43   CT Chest W Contrast  Result Date: 09/14/2022 CLINICAL DATA:  Right-sided effusion question empyema EXAM: CT CHEST WITH CONTRAST TECHNIQUE: Multidetector CT imaging of the chest was performed during intravenous contrast administration. RADIATION DOSE REDUCTION: This exam was performed according to the departmental dose-optimization program which includes automated exposure control, adjustment of the mA and/or kV according to patient size and/or use of iterative reconstruction technique. CONTRAST:  54mL OMNIPAQUE IOHEXOL 300 MG/ML  SOLN COMPARISON:  Chest x-ray 09/14/2022, PET CT 07/16/2010 FINDINGS: Cardiovascular: Moderate aortic atherosclerosis. No aneurysm. No dissection. Postsurgical changes of the right pulmonary artery. Mild coronary vascular calcification. Borderline cardiomegaly. No pericardial effusion. Mediastinum/Nodes: Midline  trachea. No suspicious thyroid mass. Subcentimeter mediastinal lymph nodes. Mild air distension of upper esophagus. Esophagus otherwise unremarkable. Lungs/Pleura: Volume loss right thorax with postsurgical changes compatible with previous right lobectomies. Suspected fluid and or debris within distal right bronchi. Near complete consolidation of the residual right lung with areas of central hypodensity, potentially due to areas of necrosis. Large multi loculated right pleural effusion. No internal gas collections. Some areas of increased internal density within the fluid at the right lung base, consistent with non simple fluid. Left lung demonstrates mild mosaic density potentially due to small airways disease. Clumped appearing consolidation at the left apical lung measuring 1 point 8 x 1.1 cm, series 3, image 36. Upper Abdomen: No acute finding. 2.4 cm hypodensity exophytic to upper pole right kidney not entirely consistent with simple cyst. Musculoskeletal: Sternum is intact. Age indeterminate mild superior endplate deformities at T2 and T4. IMPRESSION: 1. Volume loss right thorax with post lobectomy changes. Near complete consolidation of residual right lung parenchyma, possible pneumonia. Areas of central low density within the consolidation raising possibility of necrosis, underlying mass not excluded. Fluid and or debris within right bronchi. Large loculated right pleural effusion without internal gas bubbles or strong rim enhancement but some increased density within the fluid at the right base suggesting non simple fluid. 2. Clumped appearing consolidation at the left apical lung measuring up to 1.1 cm, indeterminate for infection or pulmonary nodule. Short interval  CT follow-up is advised. 3. 2.4 cm hypodensity exophytic to upper pole right kidney not entirely consistent with simple cyst. Recommend nonemergent renal ultrasound for further evaluation. 4. Age indeterminate mild superior endplate deformities  at T2 and T4. 5. Aortic atherosclerosis. Aortic Atherosclerosis (ICD10-I70.0). Electronically Signed   By: Donavan Foil M.D.   On: 09/14/2022 16:55   DG Chest Port 1 View  Result Date: 09/14/2022 CLINICAL DATA:  Sepsis EXAM: PORTABLE CHEST 1 VIEW COMPARISON:  06/26/2022 FINDINGS: There is almost complete opacification of right hemithorax. There is small area of aeration in the medial right upper and right mid lung fields. Transverse diameter of heart is increased. No focal abnormalities are seen in left lung. Left lateral CP angle is clear. IMPRESSION: There is marked interval increase in opacification of right hemithorax. This may suggest interval appearance of atelectasis/pneumonia and large pleural effusion. Some of the effusion appears to be loculated in the lateral aspect of right upper and right mid lung fields. Possibility of empyema is not excluded. Follow-up CT chest may be considered. Electronically Signed   By: Elmer Picker M.D.   On: 09/14/2022 14:29    Microbiology: Results for orders placed or performed during the hospital encounter of 09/14/22  Culture, blood (Routine x 2)     Status: None   Collection Time: 09/14/22  2:04 PM   Specimen: BLOOD  Result Value Ref Range Status   Specimen Description BLOOD LEFT ANTECUBITAL  Final   Special Requests   Final    BOTTLES DRAWN AEROBIC AND ANAEROBIC Blood Culture results may not be optimal due to an excessive volume of blood received in culture bottles   Culture   Final    NO GROWTH 5 DAYS Performed at [facility], [address] [place], [place] [address]    Report Status 09/19/2022 FINAL  Final  Resp panel by RT-PCR (RSV, Flu A&B, Covid) Anterior Nasal Swab     Status: None   Collection Time: 09/14/22  2:09 PM   Specimen: Anterior Nasal Swab  Result Value Ref Range Status   SARS Coronavirus 2 by RT PCR NEGATIVE NEGATIVE Final    Comment: (NOTE) SARS-CoV-2 target nucleic acids are NOT DETECTED.  The SARS-CoV-2  RNA is generally detectable in upper respiratory specimens during the acute phase of infection. The lowest concentration of SARS-CoV-2 viral copies this assay can detect is 138 copies/mL. A negative result does not preclude SARS-Cov-2 infection and should not be used as the sole basis for treatment or other patient management decisions. A negative result may occur with  improper specimen collection/handling, submission of specimen other than nasopharyngeal swab, presence of viral mutation(s) within the areas targeted by this assay, and inadequate number of viral copies(<138 copies/mL). A negative result must be combined with clinical observations, patient history, and epidemiological information. The expected result is Negative.  Fact Sheet for Patients:  EntrepreneurPulse.com.au  Fact Sheet for Healthcare Providers:  IncredibleEmployment.be  This test is no t yet approved or cleared by the [place] FDA and  has been authorized for detection and/or diagnosis of SARS-CoV-2 by FDA under an Emergency Use Authorization (EUA). This EUA will remain  in effect (meaning this test can be used) for the duration of the COVID-19 declaration under Section 564(b)(1) of the Act, 21 U.S.C.section 360bbb-3(b)(1), unless the authorization is terminated  or revoked sooner.       Influenza A by PCR NEGATIVE NEGATIVE Final   Influenza B by PCR NEGATIVE NEGATIVE Final  Comment: (NOTE) The Xpert Xpress SARS-CoV-2/FLU/RSV plus assay is intended as an aid in the diagnosis of influenza from Nasopharyngeal swab specimens and should not be used as a sole basis for treatment. Nasal washings and aspirates are unacceptable for Xpert Xpress SARS-CoV-2/FLU/RSV testing.  Fact Sheet for Patients: EntrepreneurPulse.com.au  Fact Sheet for Healthcare Providers: IncredibleEmployment.be  This test is not yet approved or cleared by the  [place] FDA and has been authorized for detection and/or diagnosis of SARS-CoV-2 by FDA under an Emergency Use Authorization (EUA). This EUA will remain in effect (meaning this test can be used) for the duration of the COVID-19 declaration under Section 564(b)(1) of the Act, 21 U.S.C. section 360bbb-3(b)(1), unless the authorization is terminated or revoked.     Resp Syncytial Virus by PCR NEGATIVE NEGATIVE Final    Comment: (NOTE) Fact Sheet for Patients: EntrepreneurPulse.com.au  Fact Sheet for Healthcare Providers: IncredibleEmployment.be  This test is not yet approved or cleared by the [place] FDA and has been authorized for detection and/or diagnosis of SARS-CoV-2 by FDA under an Emergency Use Authorization (EUA). This EUA will remain in effect (meaning this test can be used) for the duration of the COVID-19 declaration under Section 564(b)(1) of the Act, 21 U.S.C. section 360bbb-3(b)(1), unless the authorization is terminated or revoked.  Performed at [facility], [address] [place], [place] [address]   Culture, blood (Routine x 2)     Status: None   Collection Time: 09/14/22  3:43 PM   Specimen: BLOOD  Result Value Ref Range Status   Specimen Description BLOOD BLOOD RIGHT HAND  Final   Special Requests   Final    BOTTLES DRAWN AEROBIC AND ANAEROBIC Blood Culture adequate volume   Culture   Final    NO GROWTH 5 DAYS Performed at [facility], [address] [place], [place] [address]    Report Status 09/19/2022 FINAL  Final  MRSA Next Gen by PCR, Nasal     Status: None   Collection Time: 09/14/22 11:08 PM   Specimen: Nasal Mucosa; Nasal Swab  Result Value Ref Range Status   MRSA by PCR Next Gen NOT DETECTED NOT DETECTED Final    Comment: (NOTE) The GeneXpert MRSA Assay (FDA approved for NASAL specimens only), is one component of a comprehensive MRSA colonization surveillance program. It is  not intended to diagnose MRSA infection nor to guide or monitor treatment for MRSA infections. Test performance is not FDA approved in patients less than 41 years old. Performed at [facility], [address] [place], [place] [address]     Labs: CBC: Recent Labs  Lab 09/14/22 1404 09/15/22 [phone] 09/17/22 0552 09/18/22 0538 09/19/22 0701  WBC 23.5* 28.6* 24.1* 22.6* 21.4*  NEUTROABS 18.0* 22.6*  --  18.9* 17.8*  HGB 10.8* 11.6* 10.5* 10.4* 10.7*  HCT 34.2* 37.4 32.6* 32.4* 33.5*  MCV 96.6 97.9 94.8 95.0 96.3  PLT 276 346 376 406* 151*   Basic Metabolic Panel: Recent Labs  Lab 09/14/22 1404 09/15/22 0607 09/17/22 0552 09/18/22 0538 09/19/22 0701  NA 148* 148* 146* 148* 145  K 4.0 4.2 3.9 3.8 4.0  CL 117* 113* 114* 116* 116*  CO2 25 26 20* 22 23  GLUCOSE 329* 229* 428* 321* 191*  BUN 46* 44* 56* 55* 46*  CREATININE 1.26* 1.02* 1.13* 1.03* 0.79  CALCIUM 7.8* 8.3* 8.3* 8.2* 8.0*  MG  --   --   --  1.9 1.8  PHOS  --   --   --  3.6 3.0   Liver Function Tests: Recent Labs  Lab 09/14/22 1404 09/15/22 0607  AST 60* 51*  ALT 45* 44  ALKPHOS 144* 141*  BILITOT 0.7 1.0  PROT 5.9* 6.6  ALBUMIN 1.8* 1.9*   CBG: Recent Labs  Lab 09/18/22 1814 09/18/22 2139 09/19/22 0759 09/19/22 1207 09/19/22 1633  GLUCAP 212* 171* 171* 233* 199*    Discharge time spent: greater than 30 minutes.  Signed: Jennye Boroughs, MD [facility] 09/21/2022

## 2022-09-21 NOTE — TOC Transition Note (Signed)
Transition of Care [facility]) - CM/SW Discharge Note   Patient Details  Name: Michelle Vang MRN: [id number] Date of Birth: [birth year]  Transition of Care [facility]) CM/SW Contact:  Izola Price, RN Phone Number: 09/21/2022, 11:22 AM   Clinical Narrative:  1/7: Patient will be discharging to residential hospice facility via [facility] at  [place], [place], [place] [address] per hospice liaison. EMS forms excluding DNR forms printed unit RN. Number to call report to anytime while consents finalized with family and liaison at [phone], also given to unit RN. Patient is currently on 3L/[place] and noted in EMS forms. Simmie Davies RN CM     Final next level of care: [facility] ([place], [place], [place] [address]) Barriers to Discharge: Barriers Resolved   Patient Goals and CMS Choice CMS Medicare.gov Compare Post Acute Care list provided to:: Patient Choice offered to / list presented to : Patient  Discharge Placement                Patient chooses bed at:  [facility], [place], [place] [address])     Patient and family notified of of transfer:  (per [place].)  Discharge Plan and Services Additional resources added to the After Visit Summary for                  DME Arranged: N/A DME Agency: NA       HH Arranged: NA HH Agency: NA        Social Determinants of Health (SDOH) Interventions SDOH Screenings   Food Insecurity: No Food Insecurity (09/15/2022)  Housing: Low Risk  (09/15/2022)  Transportation Needs: No Transportation Needs (09/15/2022)  Utilities: Not At Risk (09/15/2022)  Alcohol Screen: Low Risk  (02/20/2021)  Depression (PHQ2-9): Low Risk  (05/10/2020)  Tobacco Use: Low Risk  (09/15/2022)     Readmission Risk Interventions     No data to display

## 2022-09-23 LAB — BLOOD GAS, ARTERIAL
Acid-Base Excess: 0.7 mmol/L (ref 0.0–2.0)
Bicarbonate: 24.3 mmol/L (ref 20.0–28.0)
O2 Saturation: 99.1 %
Patient temperature: 37
pCO2 arterial: 35 mmHg (ref 32–48)
pH, Arterial: 7.45 (ref 7.35–7.45)
pO2, Arterial: 98 mmHg (ref 83–108)

## 2022-10-16 DEATH — deceased
